# Patient Record
Sex: Male | Born: 1940 | Race: Black or African American | Hispanic: No | Marital: Married | Smoking: Former smoker
Health system: Southern US, Community
[De-identification: ages and names within clinical notes are randomized; demographics above are authoritative.]

## PROBLEM LIST (undated history)

## (undated) DIAGNOSIS — R32 Unspecified urinary incontinence: Secondary | ICD-10-CM

## (undated) DIAGNOSIS — I639 Cerebral infarction, unspecified: Secondary | ICD-10-CM

## (undated) DIAGNOSIS — E119 Type 2 diabetes mellitus without complications: Secondary | ICD-10-CM

## (undated) DIAGNOSIS — K219 Gastro-esophageal reflux disease without esophagitis: Secondary | ICD-10-CM

## (undated) DIAGNOSIS — R0789 Other chest pain: Secondary | ICD-10-CM

## (undated) DIAGNOSIS — R413 Other amnesia: Secondary | ICD-10-CM

## (undated) DIAGNOSIS — M1990 Unspecified osteoarthritis, unspecified site: Secondary | ICD-10-CM

## (undated) DIAGNOSIS — E1140 Type 2 diabetes mellitus with diabetic neuropathy, unspecified: Secondary | ICD-10-CM

## (undated) DIAGNOSIS — N400 Enlarged prostate without lower urinary tract symptoms: Secondary | ICD-10-CM

## (undated) DIAGNOSIS — G8929 Other chronic pain: Secondary | ICD-10-CM

## (undated) DIAGNOSIS — M549 Dorsalgia, unspecified: Secondary | ICD-10-CM

## (undated) DIAGNOSIS — I10 Essential (primary) hypertension: Secondary | ICD-10-CM

## (undated) DIAGNOSIS — R296 Repeated falls: Secondary | ICD-10-CM

## (undated) DIAGNOSIS — W19XXXA Unspecified fall, initial encounter: Secondary | ICD-10-CM

## (undated) DIAGNOSIS — G4733 Obstructive sleep apnea (adult) (pediatric): Secondary | ICD-10-CM

## (undated) DIAGNOSIS — R001 Bradycardia, unspecified: Secondary | ICD-10-CM

## (undated) HISTORY — PX: JOINT REPLACEMENT: SHX530

---

## 2005-10-10 ENCOUNTER — Emergency Department: Payer: Self-pay | Admitting: Emergency Medicine

## 2006-01-29 ENCOUNTER — Other Ambulatory Visit: Payer: Self-pay

## 2006-01-29 ENCOUNTER — Emergency Department: Payer: Self-pay | Admitting: Emergency Medicine

## 2010-10-16 DIAGNOSIS — H401190 Primary open-angle glaucoma, unspecified eye, stage unspecified: Secondary | ICD-10-CM | POA: Insufficient documentation

## 2010-10-16 DIAGNOSIS — E1139 Type 2 diabetes mellitus with other diabetic ophthalmic complication: Secondary | ICD-10-CM | POA: Insufficient documentation

## 2010-10-16 DIAGNOSIS — H18419 Arcus senilis, unspecified eye: Secondary | ICD-10-CM | POA: Insufficient documentation

## 2010-10-16 DIAGNOSIS — H409 Unspecified glaucoma: Secondary | ICD-10-CM | POA: Insufficient documentation

## 2010-10-16 DIAGNOSIS — E113299 Type 2 diabetes mellitus with mild nonproliferative diabetic retinopathy without macular edema, unspecified eye: Secondary | ICD-10-CM | POA: Insufficient documentation

## 2010-10-16 DIAGNOSIS — E119 Type 2 diabetes mellitus without complications: Secondary | ICD-10-CM | POA: Insufficient documentation

## 2010-10-16 DIAGNOSIS — H04129 Dry eye syndrome of unspecified lacrimal gland: Secondary | ICD-10-CM | POA: Insufficient documentation

## 2011-10-07 ENCOUNTER — Emergency Department: Payer: Self-pay | Admitting: Emergency Medicine

## 2012-01-21 DIAGNOSIS — E1136 Type 2 diabetes mellitus with diabetic cataract: Secondary | ICD-10-CM | POA: Insufficient documentation

## 2012-01-21 DIAGNOSIS — H16149 Punctate keratitis, unspecified eye: Secondary | ICD-10-CM | POA: Insufficient documentation

## 2012-10-06 DIAGNOSIS — M5136 Other intervertebral disc degeneration, lumbar region: Secondary | ICD-10-CM | POA: Insufficient documentation

## 2012-10-06 DIAGNOSIS — E119 Type 2 diabetes mellitus without complications: Secondary | ICD-10-CM | POA: Insufficient documentation

## 2012-10-06 DIAGNOSIS — M47816 Spondylosis without myelopathy or radiculopathy, lumbar region: Secondary | ICD-10-CM | POA: Insufficient documentation

## 2012-10-06 DIAGNOSIS — M1990 Unspecified osteoarthritis, unspecified site: Secondary | ICD-10-CM | POA: Insufficient documentation

## 2012-10-06 DIAGNOSIS — M533 Sacrococcygeal disorders, not elsewhere classified: Secondary | ICD-10-CM | POA: Insufficient documentation

## 2012-10-06 DIAGNOSIS — G4733 Obstructive sleep apnea (adult) (pediatric): Secondary | ICD-10-CM | POA: Insufficient documentation

## 2013-01-11 DIAGNOSIS — M47816 Spondylosis without myelopathy or radiculopathy, lumbar region: Secondary | ICD-10-CM | POA: Insufficient documentation

## 2013-05-06 ENCOUNTER — Observation Stay: Payer: Self-pay | Admitting: Student

## 2013-05-06 DIAGNOSIS — R1013 Epigastric pain: Secondary | ICD-10-CM

## 2013-05-06 DIAGNOSIS — I059 Rheumatic mitral valve disease, unspecified: Secondary | ICD-10-CM

## 2013-05-06 DIAGNOSIS — E785 Hyperlipidemia, unspecified: Secondary | ICD-10-CM

## 2013-05-06 DIAGNOSIS — R079 Chest pain, unspecified: Secondary | ICD-10-CM

## 2013-05-06 DIAGNOSIS — G4733 Obstructive sleep apnea (adult) (pediatric): Secondary | ICD-10-CM

## 2013-05-06 DIAGNOSIS — I10 Essential (primary) hypertension: Secondary | ICD-10-CM

## 2013-05-06 LAB — HEPATIC FUNCTION PANEL A (ARMC)
Alkaline Phosphatase: 102 U/L (ref 50–136)
Bilirubin,Total: 0.3 mg/dL (ref 0.2–1.0)
SGOT(AST): 30 U/L (ref 15–37)
SGPT (ALT): 32 U/L (ref 12–78)
Total Protein: 7.9 g/dL (ref 6.4–8.2)

## 2013-05-06 LAB — BASIC METABOLIC PANEL
Anion Gap: 3 — ABNORMAL LOW (ref 7–16)
BUN: 16 mg/dL (ref 7–18)
Chloride: 99 mmol/L (ref 98–107)
EGFR (Non-African Amer.): >60
Osmolality: 278 (ref 275–301)

## 2013-05-06 LAB — CK TOTAL AND CKMB (NOT AT ARMC)
CK, Total: 269 U/L — ABNORMAL HIGH (ref 35–232)
CK-MB: 6.8 ng/mL — ABNORMAL HIGH (ref 0.5–3.6)
CK-MB: 4.5 ng/mL — ABNORMAL HIGH (ref 0.5–3.6)

## 2013-05-06 LAB — CBC
HCT: 41.2 % (ref 40.0–52.0)
MCH: 30.8 pg (ref 26.0–34.0)
MCHC: 34.8 g/dL (ref 32.0–36.0)
MCV: 89 fL (ref 80–100)
RDW: 13 % (ref 11.5–14.5)
WBC: 9.8 10*3/uL (ref 3.8–10.6)

## 2013-05-06 LAB — LIPASE, BLOOD: Lipase: 151 U/L (ref 73–393)

## 2013-05-06 LAB — APTT
Activated PTT: 34.3 secs (ref 23.6–35.9)
Activated PTT: 103.1 secs — ABNORMAL HIGH (ref 23.6–35.9)

## 2013-05-19 DIAGNOSIS — I639 Cerebral infarction, unspecified: Secondary | ICD-10-CM | POA: Insufficient documentation

## 2013-05-19 DIAGNOSIS — K219 Gastro-esophageal reflux disease without esophagitis: Secondary | ICD-10-CM | POA: Insufficient documentation

## 2013-05-19 DIAGNOSIS — E039 Hypothyroidism, unspecified: Secondary | ICD-10-CM | POA: Insufficient documentation

## 2013-07-06 DIAGNOSIS — F1190 Opioid use, unspecified, uncomplicated: Secondary | ICD-10-CM | POA: Insufficient documentation

## 2013-11-30 DIAGNOSIS — K222 Esophageal obstruction: Secondary | ICD-10-CM | POA: Insufficient documentation

## 2013-11-30 DIAGNOSIS — IMO0001 Reserved for inherently not codable concepts without codable children: Secondary | ICD-10-CM | POA: Insufficient documentation

## 2014-01-17 DIAGNOSIS — M549 Dorsalgia, unspecified: Secondary | ICD-10-CM | POA: Insufficient documentation

## 2014-01-17 DIAGNOSIS — G894 Chronic pain syndrome: Secondary | ICD-10-CM | POA: Insufficient documentation

## 2014-04-05 DIAGNOSIS — F09 Unspecified mental disorder due to known physiological condition: Secondary | ICD-10-CM | POA: Insufficient documentation

## 2014-09-13 DIAGNOSIS — R001 Bradycardia, unspecified: Secondary | ICD-10-CM | POA: Insufficient documentation

## 2014-09-15 DIAGNOSIS — M5431 Sciatica, right side: Secondary | ICD-10-CM | POA: Insufficient documentation

## 2014-09-18 DIAGNOSIS — I10 Essential (primary) hypertension: Secondary | ICD-10-CM | POA: Insufficient documentation

## 2014-10-06 NOTE — Consult Note (Signed)
General Aspect David Sloan is a 74 yo male w/ PMHx s/f DM2, HTN, HLD, h/o CVA, OSA (on CPAP) and obesity who was admitted to [facility] this AM with epigastric/abdominal pain. Cardiology consulted for positive troponin.  He receives care at [facility]. He does not follow a cardiologist. He had a stress test ~ 2 years ago which was "normal" per patient. He reports experiencing intermittent epigastric discomfort over the past 2-3 weeks. The first episode lasted several minutes and resolved completely with antacids. Yesterday, he stepped out of the shower and experienced a similar epigastric tightness radiating along his rib cage bilaterally. He notices the discomfort after meals. No associated diaphoresis or dyspnea. He does note associated belching. No relation to exertion. He did have an episode of NBNB emesis. No frank chest pain. The pain did respond to antacids, but persisted. He denies PND, orthopnea, SOB/DOE, LE edema. No fevers, chills, diarrhea, constipation. He had a colonoscopy earlier this year which was normal. No bloody stools. He presented to the ED for further eval.   Present Illness In the ED, EKG revealed no ischemic changes. Initial TnI mildly elevated at 0.06, CK-MB 6.8. CMP- Na 135. BUN 16/Cr 1.16. CBC WNL. Lipase WNL. CXR indicated no acute cardiopulmonary process. BP elevated in the ED (180-198/80-87).   BP 192/81 this AM.   PAST MEDICAL HISTORY: Obstructive sleep apnea on CPAP, stroke 12 years ago with no residual deficits, chronic back issues, high cholesterol, diabetes, hypertension.   SURGERIES: Knee surgeries bilaterally.   ALLERGIES: Denies.   FAMILY HISTORY: Mom with diabetes.   SOCIAL HISTORY: No tobacco, alcohol or drug use.   Physical Exam:  GEN no acute distress, obese   HEENT pink conjunctivae, PERRL, hearing intact to voice   NECK supple  No masses  trachea midline  no JVD or bruits   RESP normal resp effort  clear BS  no use of accessory muscles   CARD Regular rate  and rhythm  Normal, S1, S2  No murmur   ABD soft  normal BS  epigastric tenderness to palpation   EXTR negative cyanosis/clubbing, negative edema   SKIN normal to palpation   NEURO follows commands, motor/sensory function intact   PSYCH alert, A+O to time, place, person   Review of Systems:  Subjective/Chief Complaint abdominal pain   General: No Complaints   Skin: No Complaints   ENT: No Complaints   Eyes: No Complaints   Neck: No Complaints   Respiratory: No Complaints   Cardiovascular: Chest pain or discomfort   Gastrointestinal: Nausea   Genitourinary: No Complaints   Vascular: No Complaints   Musculoskeletal: No Complaints   Neurologic: No Complaints   Hematologic: No Complaints   Endocrine: No Complaints   Psychiatric: No Complaints   Review of Systems: All other systems were reviewed and found to be negative   Medications/Allergies Reviewed Medications/Allergies reviewed   Home Medications: Medication Instructions Status  Micardis 80 mg oral tablet 1  orally once a day  Active  felodipine 5 mg oral tablet, extended release 1  orally once a day  Active  lovastatin 40 mg oral tablet 1 ea orally once a day (at bedtime)  Active  atenolol 50 mg oral tablet 1 ea orally once a day  Active  asa 81 mg daily  Active   Lab Results:  Routine Chem:  21-Nov-14 00:28   Lipase 151 (Result(s) reported on 06 May 2013 at 01:21AM.)  Cardiac:  21-Nov-14 00:28   CK, Total  355  CPK-MB, Serum  6.8 (Result(s) reported on 06 May 2013 at 01:27AM.)  Troponin I  0.06 (0.00-0.05 0.05 ng/mL or less: NEGATIVE  Repeat testing in 3-6 hrs  if clinically indicated. >0.05 ng/mL: POTENTIAL  MYOCARDIAL INJURY. Repeat  testing in 3-6 hrs if  clinically indicated. NOTE: An increase or decrease  of 30% or more on serial  testing suggests a  clinically important change)    04:28   CK, Total  269  CPK-MB, Serum  4.5 (Result(s) reported on 06 May 2013 at 05:17AM.)   Troponin I  0.06 (0.00-0.05 0.05 ng/mL or less: NEGATIVE  Repeat testing in 3-6 hrs  if clinically indicated. >0.05 ng/mL: POTENTIAL  MYOCARDIAL INJURY. Repeat  testing in 3-6 hrs if  clinically indicated. NOTE: An increase or decrease  of 30% or more on serial  testing suggests a  clinically important change)   EKG:  Interpretation EKG shows sinus bradycardia, no ST/T changes   Rate 56   Radiology Results: XRay:    21-Nov-14 00:30, Chest Portable Single View  Chest Portable Single View   REASON FOR EXAM:    CP  COMMENTS:       PROCEDURE: DXR - DXR PORTABLE CHEST SINGLE VIEW  - May 06 2013 12:30AM     CLINICAL DATA:  Chest pain    EXAM:  PORTABLE CHEST - 1 VIEW    COMPARISON:  None.    FINDINGS:  The cardiac and mediastinal silhouettes are within normal limits.  The patient has taken a slightly shallow breath. No airspace  consolidation, pleural effusion, or pulmonary edema is identified.  There is no pneumothorax.    No acute osseous abnormality identified.     IMPRESSION:  No radiographic evidence of acute cardiopulmonary process.      Electronically Signed    By: Rise Mu M.D.    On: 05/06/2013 00:39       Verified By: Fonda Kinder, M.D.,  Cardiology:    21-Nov-14 11:11, Echo Doppler  Echo Doppler   REASON FOR EXAM:      COMMENTS:       PROCEDURE: [facility] - ECHO DOPPLER COMPLETE(TRANSTHOR)  - May 06 2013 11:11AM     RESULT: Echocardiogram Report    Patient Name:   David Sloan Date of Exam: 05/06/2013  Medical Rec #:  [id number]          Custom1:  Date of Birth:  Nov 23, 1940        Height:       66.1 in  Patient Age:    72 years        Weight:       196.2 lb  Patient Gender: M               BSA:          1.99 m??    Indications: MI  Sonographer:    Cristela Blue RDCS  Referring Phys: Krystal Eaton    Sonographer Comments: Technically challenging study due to less than   ideal echo windows and The only view obtainable was  apical.    Summary:   1. Essentially a normal study.   2. Left ventricular ejection fraction, by visual estimation, is 60 to   65%.   3. Normal global left ventricular systolic function.   4. Impaired relaxation pattern of LV diastolic filling.   5. Mild left ventricular hypertrophy.   6. Normal right ventricular size and systolic function.   7. Mild mitral valve  regurgitation.   8. Normal RVSP  2D AND M-MODE MEASUREMENTS (normal ranges within parentheses):  Left Ventricle:          Normal  IVSd (2D):      1.25 cm (0.7-1.1)  LVPWd (2D):     1.14 cm (0.7-1.1) Aorta/LA:                  Normal  LVIDd (2D):     3.86 cm (3.4-5.7) Aortic Root (2D): 2.10 cm (2.4-3.7)  LVIDs (2D):     2.63 cm           Left Atrium (2D): 4.40 cm (1.9-4.0)  LV FS (2D):     31.9 %   (>25%)  LV EF (2D):     60.6 %   (>50%)                                    Right Ventricle:               RVd (2D):  LV DIASTOLIC FUNCTION:  MV Peak E: 0.84 m/s E/e' Ratio: 9.00  MV Peak A: 1.12 m/s Decel Time: 352 msec  E/A Ratio: 0.75  SPECTRAL DOPPLER ANALYSIS (where applicable):  Mitral Valve:  MV P1/2 Time: 102.08 msec  MV Area, PHT: 2.16 cm??  Aortic Valve: AoV Max Vel: 1.27 m/s AoV Peak PG: 6.5 mmHg AoV Mean PG:  LVOT Vmax: 1.06 m/s LVOT VTI:  LVOT Diameter: 2.10 cm  AoV Area, Vmax: 2.89 cm?? AoV Area, VTI:  AoV Area, Vmn:  Tricuspid Valve and PA/RV Systolic Pressure: TR Max Velocity: 1.74 m/s RA   Pressure: 5 mmHg RVSP/PASP: 17.1 mmHg    PHYSICIAN INTERPRETATION:  Left Ventricle: The left ventricular internal cavity size was normal. LV   posterior wall thickness was normal. Mild left ventricular hypertrophy.   Global LV systolic function was normal. Left ventricular ejection   fraction, by visual estimation, is 60 to 65%. Spectral Doppler shows   impaired relaxation pattern of LV diastolic filling.  Right Ventricle: Normal right ventricular size, wall thickness, and   systolic function. The right ventricular size  is normal. Global RV   systolic function is normal.  Left Atrium: The left atrium is normal in size.  Right Atrium: The right atrium is normal in size.  Pericardium: There is no evidence of pericardial effusion.  Mitral Valve: The mitral valve is normal in structure. Mild mitral valve   regurgitation is seen.  Tricuspid Valve: The tricuspid valve is normal. Trivial tricuspid   regurgitation is visualized. The tricuspid regurgitant velocity is 1.74   m/s, and with an assumed right atrial pressure of 5 mmHg, the estimated   right ventricular systolic pressure is normal at 17.1 mmHg.  Aortic Valve: The aortic valve is normal.  Aorta: The aortic root is normal in size and structure.  [id number] Julien Nordmann MD  Electronically signed by [id number] Julien Nordmann MD  Signature Date/Time: 05/06/2013/11:39:15 AM    *** Final ***    IMPRESSION: .        Verified By: Antonieta Iba, M.D., MD    No Known Allergies:   Vital Signs/Nurse's Notes: **Vital Signs.:   21-Nov-14 07:36  Vital Signs Type Routine  Temperature Temperature (F) 97.8  Celsius 36.5  Pulse Pulse 68  Pulse source if not from Vital Sign Device per Telemetry Clerk  Systolic BP Systolic BP 152  Diastolic BP (mmHg)  Diastolic BP (mmHg) 65  Mean BP 94  Pulse Ox % Pulse Ox % 99  Oxygen Delivery 2L    Impression Note was co-written with Hurman Hornony Ascencion Stegner (and Dossie Arbourim Gollan)  74 yo male w/ PMHx s/f DM2, HTN, HLD, h/o CVA, OSA (on CPAP) and obesity who [facility] with epigastric/abdominal pain. Cardiology consulted for positive troponin.  1. Epigastric pain  past 2-3 weeks occurring after meals and relieving with antacids. No relation to exertion. associated belching.   EKG indicates no evidence of ischemia. TnI mildly elevated at 0.06 x 3.  He has no prior cardiac history.   stress test ~ 2 years ago was normal per patient. Cardiac RFs include DM2, HTN, HLD, obesity. Symptoms  sound atypical and more GI related.  LFTs and lipase WNL. Abdominal ultrasound pending.  He is diabetic and may present atypically. Troponin leak may be secondary to demand ischemia from markedly elevated BP in the ED.  -- Echo is essentially normal -- Lexiscan Myoview ordered for inpatient -- Continue ASA, statin, NTG SL PRN -- d/c heparin -- Switch atenolol for carvedilol given HTN, added alpha activity. HR 50s yesterday and today. Could start at 6.25mg  BID, monitor rates -- GI cocktail, PPI, H2 blockers for GI etiologies. Further work-up per primary team.   2. Hypertension Markedly elevated this AM and in the ED.  -- Carvedilol for atenolol as above -- Increase CCB -- Consider adding hydralazine  3. Hyperlipidemia -- Check lipid panel -- Continue statin  4. IDDM -- Per primary team.  5. OSA on CPAP -- Review 2D echo   Electronic Signatures for Addendum Section:  Julien NordmannGollan, Timothy (MD) (Signed Addendum 21-Nov-14 11:58)  Patient seena nd examined. Agree with above. symptoms concerning more for GI etiology. sterss test planned to definatively exclude cardiac etiology as patient continues to present to the ER. If stress negative, would recommend further GI workup, possible EGD.   Electronic Signatures: Gery Prayrguello, Jaydah Stahle A (PA-C)  (Signed 21-Nov-14 10:29)  Authored: General Aspect/Present Illness, History and Physical Exam, Review of System, Home Medications, Labs, EKG , Radiology, Allergies, Vital Signs/Nurse's Notes, Impression/Plan Julien NordmannGollan, Timothy (MD)  (Signed 21-Nov-14 11:56)  Authored: Review of System, EKG , Radiology, Vital Signs/Nurse's Notes, Impression/Plan  Co-Signer: General Aspect/Present Illness, History and Physical Exam, Review of System, Home Medications, Labs, EKG , Radiology, Allergies, Vital Signs/Nurse's Notes, Impression/Plan   Last Updated: 21-Nov-14 11:58 by Julien NordmannGollan, Timothy (MD)

## 2014-10-06 NOTE — H&P (Signed)
PATIENT NAME:  David Sloan, David Sloan MR#:  [id number] DATE OF BIRTH:  Dec 22, 1940  DATE OF ADMISSION:  05/06/2013  PRIMARY CARE PHYSICIAN: At [facility].   REFERRING PHYSICIAN: Dr. Lenard Lance.   CHIEF COMPLAINT: Epigastric pain, chest pressure.   HISTORY OF PRESENT ILLNESS: The patient is a pleasant 74 year old male with a history of diabetes, obstructive sleep apnea on CPAP, stroke in the past who comes in with acute onset epigastric pain and pressure that started at 9:00. There is no radiation to this. When I asked to point at it, he points in the upper abdomen, lower chest, mid sternal area. There is no shortness of breath or dizziness, but the patient did have an episode of nausea and vomiting. The patient had a similar less intense episode a couple of weeks ago that resolved before he could come to the ER. Here, he has a mildly elevated troponin and CK-MB. He has ongoing epigastric pain and pressure. Heparin drip has been started, and hospitalist service was contacted for further evaluation and management.   Again, this pain is nonradiating. There is no shortness of breath or dizziness with this pain. He has had no history of MI in the past but has multiple risk factors.   PAST MEDICAL HISTORY: Obstructive sleep apnea on CPAP, stroke 12 years ago with no residual deficits, chronic back issues, high cholesterol, diabetes, hypertension.   SURGERIES: Knee surgeries bilaterally.   ALLERGIES: Denies.   FAMILY HISTORY: Mom with diabetes.   SOCIAL HISTORY: No tobacco, alcohol or drug use.   OUTPATIENT MEDICATIONS: He is on 70/30 insulin 30 units b.i.d., aspirin 81 mg daily, atenolol 50 mg daily, felodipine XL 5 mg daily, lovastatin 40 mg, Micardis 80 mg once a day.   REVIEW OF SYSTEMS:  CONSTITUTIONAL: The patient has intentional weight loss. No fever, chills, fatigue, weakness.  EYES: No blurry vision or double vision.  ENT: No tinnitus or hearing loss. No postnasal drip or sore throat.  RESPIRATORY:  No cough, wheezing, shortness of breath or dyspnea on exertion.  CARDIOVASCULAR: As above. No swelling in the legs. No arrhythmia. Has hypertension. No history of CHF or MI.   GASTROINTESTINAL: Did have an episode of nausea and vomiting. No abdominal pain. Some epigastric the pain and pressure. No GERD or metallic taste in his mouth.  GENITOURINARY: Denies dysuria or hematuria.  HEMATOLOGIC AND LYMPHATIC: Denies anemia or easy bruising.  SKIN: No rashes.  MUSCULOSKELETAL: Has chronic back pain and arthritis.  NEUROLOGIC: No focal weakness or numbness.  PSYCHIATRIC: No anxiety or insomnia.   PHYSICAL EXAMINATION:  VITAL SIGNS: Temperature on arrival 98.3, pulse rate 57, respiratory rate 18, blood pressure 186/87, last blood pressure 182/66. O2 sat 100% on room air.  GENERAL: The patient is an obese male lying in bed, no obvious distress.  HEENT: Normocephalic, atraumatic. Pupils are equal and reactive. Anicteric sclerae. Extraocular muscles intact. Poor dentition.  NECK: Supple. No thyroid tenderness. No cervical lymphadenopathy. No JVD.  CARDIOVASCULAR: S1, S2. Bradycardic. No murmurs, rubs or gallops.  LUNGS: Clear to auscultation without wheezing, rhonchi or rales.  ABDOMEN: Soft, nontender and nondistended. Positive bowel sounds in all quadrants.  EXTREMITIES: No significant lower extremity edema.  SKIN: No obvious rashes.  NEUROLOGIC: Cranial nerves II through XII grossly intact. Strength is 5 out of 5 in all extremities. Sensation is intact to light touch.  PSYCHIATRIC: Awake, alert, oriented x 3 and cooperative.   LABORATORIES: Glucose 216, BUN 16, creatinine 1.14, sodium 135, potassium 3.9. Lipase 151. LFTs within  normal limits. Troponin 0.06, CK-MB 6.8. CK total 355. WBC 9.8, hemoglobin 14.3, platelets are 202. EKG: No previous EKGs to compare with, but this one shows sinus brady. Nonspecific ST changes. No acute ST elevations or depressions. Some flattening of T waves in some of the  inferior leads and V6. Chest x-ray, 1 view, showing no radiological evidence of acute cardiopulmonary process.   ASSESSMENT AND PLAN: A 74 year old with multiple risk factors for coronary artery disease including hypertension, diabetes, hyperlipidemia, history of stroke who comes in with epigastric/lower chest pain and pressure with a positive troponin. As the pain and pressure is ongoing, will admit the patient to the hospital for suspected non-ST elevation myocardial infarction. Would go ahead and continue the heparin drip and make the patient n.p.o. except medications. Obtain a cardiology consult in case they want to take him to the catheterization lab. Cycle the troponins. Obtain an echocardiogram. Would continue the aspirin, statin. Continue his beta blocker. Check a lipid profile, hemoglobin A1c and a TSH. Will admit him to telemetry. Will put him on morphine as well as nitro patch for symptomatic relief. As there is some epigastric component, will also go ahead and order some Maalox as needed. I would hold his 70/30 insulin. Put him on sliding scale insulin for now given his n.p.o. status. Will continue blood pressure management and also had hydralazine intravenous p.r.n. as his pressure is also elevated. He appears to have accelerated hypertension. We would continue outpatient medications for now. For deep vein thrombosis prophylaxis, he will be on heparin drip.   CODE STATUS: The patient is FULL CODE.   TOTAL TIME SPENT: 50 minutes.    ____________________________ Krystal EatonShayiq Torii Royse, MD sa:gb D: 05/06/2013 03:10:08 ET T: 05/06/2013 04:08:31 ET JOB#: [id number]  cc: Krystal EatonShayiq Zidane Renner, MD, <Dictator> Krystal EatonSHAYIQ Sharnese Heath MD ELECTRONICALLY SIGNED 05/23/2013 20:00

## 2014-10-06 NOTE — Discharge Summary (Signed)
PATIENT NAME:  David CombsWYATT, Ulas J MR#:  [id number] DATE OF BIRTH:  [birth year]  DATE OF ADMISSION:  05/06/2013 DATE OF DISCHARGE:  05/06/2013  PRESENTING COMPLAINT: Epigastric/chest pain.   DISCHARGE DIAGNOSES: 1.  Chest pain, appears epigastric. Pain likely due to gastroesophageal reflux disease or chronic cholelithiasis. Myoview stress test within normal limits.  2.  Hypertension.  3.  Type 2 diabetes.  4.  Hyperlipidemia.   CODE STATUS: FULL CODE.   DISCHARGE MEDICATIONS: 1.  Aspirin 81 mg daily.  2.  Atenolol 50 mg daily.  3.  Lovastatin 40 mg daily at bedtime.  4.  Amlodipine 5 mg extended release p.o. daily.  5.  Micardis 80 mg daily.  6.  Protonix 40 mg p.o. daily.   DISCHARGE INSTRUCTIONS:  1.  [facility]  2.  Low sodium, carbohydrate-controlled ADA diet.  3.  The patient to follow-up with Dr. Egbert GaribaldiBird on 05/29/2013 at 1:45 p.m. for chronic gallstones.   LABORATORY AND DIAGNOSTICS: Myoview stress test within normal limits. EF of 65%.   Ultrasound of the abdomen showed gallstones and sludge within the gallbladder, otherwise unremarkable.   Echo Doppler within normal limits with EF of 60% to 65%,  except mild impaired relaxation of LV diastolic function.   Troponin was 0.06, 0.06, and 0.06. CBC within normal limits. Lipase is 151. LFTs within normal limits.  CONSULTANTS:  Cardiology with Dr. Mariah MillingGollan.  BRIEF SUMMARY OF HOSPITAL COURSE: [name] is a 74 year old Caucasian gentleman with history of hypertension and type 2 diabetes who comes in with epigastric pain. He was admitted with:  1.  Epigastric pain. The patient was admitted on the telemetry floor where he remained in sinus rhythm. No acute ischemic changes on EKG were noted. His symptoms seemed to be more from acid reflux. He was seen by cardiology and he was subjected to chemical stress test which essentially was within normal limits. He was started on p.o. PPI. He felt better and his symptoms were at  home improving with on and off using TUMS and Rolaids. Ultrasound of the abdomen was done, which showed gallstones, which are likely chronic. His LFTs are within normal limits. The patient was advised to see surgery as outpatient and if his symptoms do not improve getting the gallbladder removed. He has an appointment with Dr. Egbert GaribaldiBird in December.  2.  Hypertension. Micardis, atenolol, and felodipine were continued.  3.  Type 2 diabetes. Diet-controlled.  4.  Hyperlipidemia. On statins.   Hospital stay otherwise remained stable. The patient remained a FULL CODE.   TIME SPENT: 40 minutes.  ____________________________ Wylie HailSona A. Allena KatzPatel, MD sap:sb D: 05/09/2013 07:11:34 ET T: 05/09/2013 07:54:03 ET JOB#: [id number]  cc: Kalaya Infantino A. Allena KatzPatel, MD, <Dictator> Dr. Mellody DanceKizer [facility] Mark A. Egbert GaribaldiBird, MD  Willow OraSONA A Refujio Haymer MD ELECTRONICALLY SIGNED 05/09/2013 14:18

## 2015-02-13 DIAGNOSIS — Z79899 Other long term (current) drug therapy: Secondary | ICD-10-CM | POA: Insufficient documentation

## 2015-03-30 ENCOUNTER — Emergency Department: Payer: Medicare Other

## 2015-03-30 ENCOUNTER — Inpatient Hospital Stay: Payer: Medicare Other

## 2015-03-30 ENCOUNTER — Inpatient Hospital Stay (HOSPITAL_COMMUNITY)
Admit: 2015-03-30 | Discharge: 2015-03-30 | Disposition: A | Discharge status: Home / Self Care | Payer: Medicare Other | Attending: Specialist | Admitting: Specialist

## 2015-03-30 ENCOUNTER — Inpatient Hospital Stay
Admission: EM | Admit: 2015-03-30 | Discharge: 2015-03-31 | DRG: 069 | Disposition: A | Discharge status: Home / Self Care | Payer: Medicare Other | Attending: Internal Medicine | Admitting: Internal Medicine

## 2015-03-30 DIAGNOSIS — E1140 Type 2 diabetes mellitus with diabetic neuropathy, unspecified: Secondary | ICD-10-CM | POA: Diagnosis present

## 2015-03-30 DIAGNOSIS — R7989 Other specified abnormal findings of blood chemistry: Secondary | ICD-10-CM

## 2015-03-30 DIAGNOSIS — Z820 Family history of epilepsy and other diseases of the nervous system: Secondary | ICD-10-CM | POA: Diagnosis not present

## 2015-03-30 DIAGNOSIS — R7401 Elevation of levels of liver transaminase levels: Secondary | ICD-10-CM

## 2015-03-30 DIAGNOSIS — I6310 Cerebral infarction due to embolism of unspecified precerebral artery: Secondary | ICD-10-CM | POA: Diagnosis not present

## 2015-03-30 DIAGNOSIS — I639 Cerebral infarction, unspecified: Secondary | ICD-10-CM

## 2015-03-30 DIAGNOSIS — I119 Hypertensive heart disease without heart failure: Secondary | ICD-10-CM | POA: Diagnosis present

## 2015-03-30 DIAGNOSIS — R778 Other specified abnormalities of plasma proteins: Secondary | ICD-10-CM

## 2015-03-30 DIAGNOSIS — E785 Hyperlipidemia, unspecified: Secondary | ICD-10-CM | POA: Diagnosis present

## 2015-03-30 DIAGNOSIS — N400 Enlarged prostate without lower urinary tract symptoms: Secondary | ICD-10-CM | POA: Diagnosis present

## 2015-03-30 DIAGNOSIS — K8020 Calculus of gallbladder without cholecystitis without obstruction: Secondary | ICD-10-CM

## 2015-03-30 DIAGNOSIS — R29898 Other symptoms and signs involving the musculoskeletal system: Secondary | ICD-10-CM | POA: Diagnosis present

## 2015-03-30 DIAGNOSIS — Z87891 Personal history of nicotine dependence: Secondary | ICD-10-CM | POA: Diagnosis not present

## 2015-03-30 DIAGNOSIS — G459 Transient cerebral ischemic attack, unspecified: Secondary | ICD-10-CM | POA: Diagnosis present

## 2015-03-30 DIAGNOSIS — I10 Essential (primary) hypertension: Secondary | ICD-10-CM

## 2015-03-30 DIAGNOSIS — I214 Non-ST elevation (NSTEMI) myocardial infarction: Secondary | ICD-10-CM

## 2015-03-30 DIAGNOSIS — E1165 Type 2 diabetes mellitus with hyperglycemia: Secondary | ICD-10-CM | POA: Diagnosis present

## 2015-03-30 DIAGNOSIS — E1159 Type 2 diabetes mellitus with other circulatory complications: Secondary | ICD-10-CM

## 2015-03-30 DIAGNOSIS — I8290 Acute embolism and thrombosis of unspecified vein: Secondary | ICD-10-CM

## 2015-03-30 DIAGNOSIS — IMO0002 Reserved for concepts with insufficient information to code with codable children: Secondary | ICD-10-CM

## 2015-03-30 DIAGNOSIS — Z794 Long term (current) use of insulin: Secondary | ICD-10-CM | POA: Diagnosis not present

## 2015-03-30 DIAGNOSIS — I951 Orthostatic hypotension: Secondary | ICD-10-CM | POA: Diagnosis present

## 2015-03-30 DIAGNOSIS — R4781 Slurred speech: Secondary | ICD-10-CM | POA: Diagnosis not present

## 2015-03-30 DIAGNOSIS — Z8546 Personal history of malignant neoplasm of prostate: Secondary | ICD-10-CM

## 2015-03-30 DIAGNOSIS — K219 Gastro-esophageal reflux disease without esophagitis: Secondary | ICD-10-CM | POA: Diagnosis present

## 2015-03-30 DIAGNOSIS — R112 Nausea with vomiting, unspecified: Secondary | ICD-10-CM

## 2015-03-30 DIAGNOSIS — G4733 Obstructive sleep apnea (adult) (pediatric): Secondary | ICD-10-CM | POA: Diagnosis present

## 2015-03-30 DIAGNOSIS — Z7982 Long term (current) use of aspirin: Secondary | ICD-10-CM

## 2015-03-30 DIAGNOSIS — Z8673 Personal history of transient ischemic attack (TIA), and cerebral infarction without residual deficits: Secondary | ICD-10-CM

## 2015-03-30 DIAGNOSIS — Z833 Family history of diabetes mellitus: Secondary | ICD-10-CM

## 2015-03-30 DIAGNOSIS — R1110 Vomiting, unspecified: Secondary | ICD-10-CM

## 2015-03-30 DIAGNOSIS — K829 Disease of gallbladder, unspecified: Secondary | ICD-10-CM | POA: Diagnosis not present

## 2015-03-30 DIAGNOSIS — R740 Nonspecific elevation of levels of transaminase and lactic acid dehydrogenase [LDH]: Secondary | ICD-10-CM

## 2015-03-30 DIAGNOSIS — I2510 Atherosclerotic heart disease of native coronary artery without angina pectoris: Secondary | ICD-10-CM | POA: Diagnosis present

## 2015-03-30 DIAGNOSIS — E782 Mixed hyperlipidemia: Secondary | ICD-10-CM

## 2015-03-30 DIAGNOSIS — R9431 Abnormal electrocardiogram [ECG] [EKG]: Secondary | ICD-10-CM

## 2015-03-30 HISTORY — DX: Dorsalgia, unspecified: M54.9

## 2015-03-30 HISTORY — DX: Benign prostatic hyperplasia without lower urinary tract symptoms: N40.0

## 2015-03-30 HISTORY — DX: Other amnesia: R41.3

## 2015-03-30 HISTORY — DX: Unspecified osteoarthritis, unspecified site: M19.90

## 2015-03-30 HISTORY — DX: Unspecified urinary incontinence: R32

## 2015-03-30 HISTORY — DX: Repeated falls: R29.6

## 2015-03-30 HISTORY — DX: Cerebral infarction, unspecified: I63.9

## 2015-03-30 HISTORY — DX: Unspecified fall, initial encounter: W19.XXXA

## 2015-03-30 HISTORY — DX: Obstructive sleep apnea (adult) (pediatric): G47.33

## 2015-03-30 HISTORY — DX: Other chronic pain: G89.29

## 2015-03-30 HISTORY — DX: Bradycardia, unspecified: R00.1

## 2015-03-30 HISTORY — DX: Type 2 diabetes mellitus with diabetic neuropathy, unspecified: E11.40

## 2015-03-30 HISTORY — DX: Essential (primary) hypertension: I10

## 2015-03-30 HISTORY — DX: Other chest pain: R07.89

## 2015-03-30 HISTORY — DX: Type 2 diabetes mellitus without complications: E11.9

## 2015-03-30 LAB — PROTIME-INR
INR: 1.32
PROTHROMBIN TIME: 16.6 s — AB (ref 11.4–15.0)

## 2015-03-30 LAB — TROPONIN I
Troponin I: 0.21 ng/mL — ABNORMAL HIGH (ref <0.031)
Troponin I: 0.41 ng/mL — ABNORMAL HIGH (ref <0.031)
Troponin I: 0.54 ng/mL — ABNORMAL HIGH (ref <0.031)
Troponin I: 0.54 ng/mL — ABNORMAL HIGH (ref <0.031)

## 2015-03-30 LAB — CBC
HCT: 36.7 % — ABNORMAL LOW (ref 40.0–52.0)
Hemoglobin: 12.5 g/dL — ABNORMAL LOW (ref 13.0–18.0)
MCH: 30.9 pg (ref 26.0–34.0)
MCHC: 34.1 g/dL (ref 32.0–36.0)
MCV: 90.4 fL (ref 80.0–100.0)
PLATELETS: 161 10*3/uL (ref 150–440)
RBC: 4.06 MIL/uL — AB (ref 4.40–5.90)
RDW: 13 % (ref 11.5–14.5)
WBC: 16.6 10*3/uL — AB (ref 3.8–10.6)

## 2015-03-30 LAB — DIFFERENTIAL
BASOS PCT: 0 %
Basophils Absolute: 0.1 10*3/uL (ref 0–0.1)
EOS PCT: 0 %
Eosinophils Absolute: 0 10*3/uL (ref 0–0.7)
LYMPHS PCT: 1 %
Lymphs Abs: 0.2 10*3/uL — ABNORMAL LOW (ref 1.0–3.6)
MONO ABS: 1 10*3/uL (ref 0.2–1.0)
Monocytes Relative: 6 %
NEUTROS PCT: 93 %
Neutro Abs: 15.3 10*3/uL — ABNORMAL HIGH (ref 1.4–6.5)

## 2015-03-30 LAB — GLUCOSE, CAPILLARY
GLUCOSE-CAPILLARY: 162 mg/dL — AB (ref 65–99)
Glucose-Capillary: 218 mg/dL — ABNORMAL HIGH (ref 65–99)
Glucose-Capillary: 190 mg/dL — ABNORMAL HIGH (ref 65–99)

## 2015-03-30 LAB — COMPREHENSIVE METABOLIC PANEL
ALBUMIN: 3.5 g/dL (ref 3.5–5.0)
ALK PHOS: 73 U/L (ref 38–126)
ALT: 232 U/L — ABNORMAL HIGH (ref 17–63)
ANION GAP: 8 (ref 5–15)
AST: 301 U/L — ABNORMAL HIGH (ref 15–41)
BUN: 16 mg/dL (ref 6–20)
CALCIUM: 9.1 mg/dL (ref 8.9–10.3)
CHLORIDE: 106 mmol/L (ref 101–111)
CO2: 26 mmol/L (ref 22–32)
Creatinine, Ser: 1.23 mg/dL (ref 0.61–1.24)
GFR calc non Af Amer: 57 mL/min — ABNORMAL LOW (ref >60)
GLUCOSE: 152 mg/dL — AB (ref 65–99)
POTASSIUM: 3.6 mmol/L (ref 3.5–5.1)
SODIUM: 140 mmol/L (ref 135–145)
Total Bilirubin: 2.5 mg/dL — ABNORMAL HIGH (ref 0.3–1.2)
Total Protein: 6.3 g/dL — ABNORMAL LOW (ref 6.5–8.1)

## 2015-03-30 LAB — APTT: aPTT: 32 seconds (ref 24–36)

## 2015-03-30 MED ORDER — GABAPENTIN 300 MG PO CAPS: 300.0000 mg | ORAL_CAPSULE | Freq: Two times a day (BID) | ORAL | Status: DC

## 2015-03-30 MED ORDER — ASPIRIN 81 MG PO CHEW
324.0000 mg | CHEWABLE_TABLET | Freq: Once | ORAL | Status: AC
  Administered 2015-03-30: 324 mg via ORAL
  Filled 2015-03-30: qty 4

## 2015-03-30 MED ORDER — IOHEXOL 350 MG/ML SOLN
100.0000 mL | Freq: Once | INTRAVENOUS | Status: AC | PRN
  Administered 2015-03-30: 18:00:00 100 mL via INTRAVENOUS

## 2015-03-30 MED ORDER — AMLODIPINE BESYLATE 5 MG PO TABS
5.0000 mg | ORAL_TABLET | Freq: Every day | ORAL | Status: DC
  Administered 2015-03-30 – 2015-03-31 (×2): 5 mg via ORAL
  Filled 2015-03-30 (×2): qty 1

## 2015-03-30 MED ORDER — ENOXAPARIN SODIUM 40 MG/0.4ML ~~LOC~~ SOLN
40.0000 mg | SUBCUTANEOUS | Status: DC
  Administered 2015-03-30 – 2015-03-31 (×2): 40 mg via SUBCUTANEOUS
  Filled 2015-03-30 (×2): qty 0.4

## 2015-03-30 MED ORDER — ASPIRIN-DIPYRIDAMOLE ER 25-200 MG PO CP12
1.0000 | ORAL_CAPSULE | Freq: Two times a day (BID) | ORAL | Status: DC
  Administered 2015-03-30 – 2015-03-31 (×3): 1 via ORAL
  Filled 2015-03-30 (×3): qty 1

## 2015-03-30 MED ORDER — GABAPENTIN 300 MG PO CAPS
600.0000 mg | ORAL_CAPSULE | Freq: Every evening | ORAL | Status: DC
  Administered 2015-03-30: 18:00:00 600 mg via ORAL
  Filled 2015-03-30: qty 2

## 2015-03-30 MED ORDER — SODIUM CHLORIDE 0.9 % IJ SOLN
3.0000 mL | Freq: Two times a day (BID) | INTRAMUSCULAR | Status: DC
  Administered 2015-03-30 – 2015-03-31 (×3): 3 mL via INTRAVENOUS

## 2015-03-30 MED ORDER — OXYCODONE-ACETAMINOPHEN 7.5-325 MG PO TABS
1.0000 | ORAL_TABLET | Freq: Four times a day (QID) | ORAL | Status: DC | PRN
  Administered 2015-03-30 – 2015-03-31 (×3): 1 via ORAL
  Filled 2015-03-30 (×3): qty 1

## 2015-03-30 MED ORDER — TAMSULOSIN HCL 0.4 MG PO CAPS
0.4000 mg | ORAL_CAPSULE | Freq: Every day | ORAL | Status: DC
  Administered 2015-03-30 – 2015-03-31 (×2): 0.4 mg via ORAL
  Filled 2015-03-30 (×2): qty 1

## 2015-03-30 MED ORDER — INSULIN DETEMIR 100 UNIT/ML ~~LOC~~ SOLN
35.0000 [IU] | Freq: Every day | SUBCUTANEOUS | Status: DC
  Administered 2015-03-31: 09:00:00 35 [IU] via SUBCUTANEOUS
  Filled 2015-03-30 (×2): qty 0.35

## 2015-03-30 MED ORDER — ONDANSETRON HCL 4 MG/2ML IJ SOLN: 4.0000 mg | Freq: Four times a day (QID) | INTRAMUSCULAR | Status: DC | PRN

## 2015-03-30 MED ORDER — ONDANSETRON HCL 4 MG PO TABS: 4.0000 mg | ORAL_TABLET | Freq: Four times a day (QID) | ORAL | Status: DC | PRN

## 2015-03-30 MED ORDER — LOSARTAN POTASSIUM 50 MG PO TABS
100.0000 mg | ORAL_TABLET | Freq: Every day | ORAL | Status: DC
  Administered 2015-03-31: 100 mg via ORAL
  Filled 2015-03-30: qty 2

## 2015-03-30 MED ORDER — PRAVASTATIN SODIUM 20 MG PO TABS
40.0000 mg | ORAL_TABLET | Freq: Every day | ORAL | Status: DC
  Administered 2015-03-30: 40 mg via ORAL
  Filled 2015-03-30: qty 2

## 2015-03-30 MED ORDER — CLONIDINE HCL 0.1 MG PO TABS
0.2000 mg | ORAL_TABLET | Freq: Every day | ORAL | Status: DC
  Administered 2015-03-31: 0.2 mg via ORAL
  Filled 2015-03-30: qty 2

## 2015-03-30 MED ORDER — ACETAMINOPHEN 650 MG RE SUPP: 650.0000 mg | Freq: Four times a day (QID) | RECTAL | Status: DC | PRN

## 2015-03-30 MED ORDER — INSULIN NPH (HUMAN) (ISOPHANE) 100 UNIT/ML ~~LOC~~ SUSP: 30.0000 [IU] | Freq: Two times a day (BID) | SUBCUTANEOUS | Status: DC

## 2015-03-30 MED ORDER — GABAPENTIN 300 MG PO CAPS
300.0000 mg | ORAL_CAPSULE | Freq: Every morning | ORAL | Status: DC
  Administered 2015-03-30 – 2015-03-31 (×2): 300 mg via ORAL
  Filled 2015-03-30 (×2): qty 1

## 2015-03-30 MED ORDER — INSULIN DETEMIR 100 UNIT/ML ~~LOC~~ SOLN
30.0000 [IU] | Freq: Every day | SUBCUTANEOUS | Status: DC
  Administered 2015-03-30: 18:00:00 30 [IU] via SUBCUTANEOUS
  Filled 2015-03-30 (×2): qty 0.3

## 2015-03-30 MED ORDER — ACETAMINOPHEN 325 MG PO TABS: 650.0000 mg | ORAL_TABLET | Freq: Four times a day (QID) | ORAL | Status: DC | PRN

## 2015-03-30 MED ORDER — OXYBUTYNIN CHLORIDE ER 5 MG PO TB24
5.0000 mg | ORAL_TABLET | Freq: Every day | ORAL | Status: DC
  Administered 2015-03-30 – 2015-03-31 (×2): 5 mg via ORAL
  Filled 2015-03-30 (×2): qty 1

## 2015-03-30 MED ORDER — PANTOPRAZOLE SODIUM 40 MG PO TBEC
40.0000 mg | DELAYED_RELEASE_TABLET | Freq: Two times a day (BID) | ORAL | Status: DC
  Administered 2015-03-30 – 2015-03-31 (×3): 40 mg via ORAL
  Filled 2015-03-30 (×3): qty 1

## 2015-03-30 NOTE — ED Notes (Signed)
Patient remains in US.

## 2015-03-30 NOTE — Progress Notes (Signed)
   03/30/15 0800  Clinical Encounter Type  Visited With Patient and family together  Visit Type Code  Referral From Nurse  Consult/Referral To Chaplain  Spiritual Encounters  Spiritual Needs Prayer  Responded to ED Code Stroke page.  Pt was alert and responsive.  Family present, nurse tech Helmut MusterAlicia, pt's daughter was present.  Pt thanked me for coming and said he was feeling a little better, but stated he was concerned about weakness in legs.  I asked pt if chaplain could visit/check in with him and his family later and he said he would appreciate that.  Will have on call or unit chaplain follow up with both patient and daughter later today.  [employer]bury Automotive GroupChaplain Marletta Bousquet-pager [phone]

## 2015-03-30 NOTE — Progress Notes (Signed)
*  PRELIMINARY RESULTS* Echocardiogram 2D Echocardiogram has been performed.  Georgann HousekeeperJerry R Hege 03/30/2015, 2:05 PM

## 2015-03-30 NOTE — Progress Notes (Signed)
Elevated troponin, notified MD. Cardiology consult called.

## 2015-03-30 NOTE — ED Provider Notes (Addendum)
[facility] Emergency Department Provider Note  Time seen: 7:44 AM  I have reviewed the triage vital signs and the nursing notes.   HISTORY  Chief Complaint Code Stroke    HPI David Sloan is a 74 y.o. male with a past medical history of hypertension, diabetes, CVA on Coumadin, presents the emergency department with weakness in his legs. According to the patient he awoke at 12:45 AM shaking and feeling nauseated. Patient states he had one episode of vomiting and then felt considerably better so he went back to sleep. Upon awakening this morning he noted that his legs felt weak, and he felt off balance when walking. Patient believes his left leg feels more weak in his right leg. Denies any numbness. Denies any visual changes. The patient doesn't history of 2 CVAs in the past although he states he has no residual symptoms. Patient's daughter is here in the emergency department, and states he appears to have a left facial droop to her, and his speech sounds more slurred.    Past Medical History  Diagnosis Date  . Hypertension   . Diabetes mellitus without complication (HCC)   . Stroke [facility])     There are no active problems to display for this patient.   No past surgical history on file.  No current outpatient prescriptions on file.  Allergies Review of patient's allergies indicates no known allergies.  No family history on file.  Social History Social History  Substance Use Topics  . Smoking status: Never Smoker   . Smokeless tobacco: None  . Alcohol Use: No    Review of Systems Constitutional: Negative for fever. Cardiovascular: Negative for chest pain. Respiratory: Negative for shortness of breath. Gastrointestinal: Negative for abdominal pain Musculoskeletal: Negative for back pain. Neurological: Negative for . Increased weakness on the left side. No numbness. 10-point ROS otherwise  negative.  ____________________________________________   PHYSICAL EXAM:  VITAL SIGNS: ED Triage Vitals  Enc Vitals Group     BP 03/30/15 0717 113/53 mmHg     Pulse Rate 03/30/15 0717 98     Resp 03/30/15 0717 18     Temp 03/30/15 0717 98.5 F (36.9 C)     Temp Source 03/30/15 0717 Oral     SpO2 03/30/15 0717 96 %     Weight 03/30/15 0717 203 lb (92.08 kg)     Height 03/30/15 0717  (1.676 m)     Head Cir --      Peak Flow --      Pain Score --      Pain Loc --      Pain Edu? --      Excl. in GC? --     Constitutional: Alert and oriented. Well appearing and in no distress. Eyes: Normal exam ENT   Head: Normocephalic and atraumatic.   Mouth/Throat: Mucous membranes are moist. Cardiovascular: Normal rate, regular rhythm. No murmur Respiratory: Normal respiratory effort without tachypnea nor retractions. Breath sounds are clear and equal bilaterally. No wheezes/rales/rhonchi. Gastrointestinal: Soft and nontender. No distention.   Musculoskeletal: Nontender with normal range of motion in all extremities.  Neurologic:  Slightly slurred speech. No difficulty with language. Equal grip strengths bilaterally, 5/5 motor in all extremities. Patient does have a slight left facial droop, as well as a slight left upper extremity pronator drift. Psychiatric: Mood and affect are normal. Speech and behavior are normal. ____________________________________________    EKG  EKG reviewed and interpreted by myself shows sinus rhythm at  88 bpm, narrow QRS, left axis deviation, otherwise normal intervals, nonspecific ST changes present. No ST elevation noted.  ____________________________________________    RADIOLOGY  CT head shows atrophy with prior infarcts. Potential acute infarcts in the occipital lobe.  ____________________________________________    INITIAL IMPRESSION / ASSESSMENT AND PLAN / ED COURSE  Pertinent labs & imaging results that were available during my  care of the patient were reviewed by me and considered in my medical decision making (see chart for details).  Patient presents with signs and symptoms of a CVA causing left-sided deficits of facial droop and left pronator drift. Patient feels off balance and unsteady this morning. CT shows possible acute infarct in the occipital lobe. Patient is on Coumadin, and as symptoms began at 1245 he is not a candidate for TPA therapy given anticoagulation and time window. Currently awaiting lab results, plan to admit to the hospital for acute CVA.  Patient's troponin is elevated 0.21. Patient's EKG and further review appears to have new inferolateral T-wave inversions compared to prior EKG of 05/05/13. Patient denies any chest pain or trouble breathing. Patient will be admitted to the hospital with serial troponins. Patient's INR is currently subtherapeutic at 1.3.  Patient's liver function tests have resulted elevated as well. Patient denies any abdominal pain. Given the patient's inferolateral T-wave inversions, elevated troponin, and elevated liver function tests, this raises the possibility of a prior MI leading to congestive heart failure and hepatic congestion. We'll obtain an ultrasound to help further evaluate the patient's liver.   CRITICAL CARE Performed by: Minna AntisPADUCHOWSKI, Markasia Carrol   Total critical care time: 30 minutes  Critical care time was exclusive of separately billable procedures and treating other patients.  Critical care was necessary to treat or prevent imminent or life-threatening deterioration.  Critical care was time spent personally by me on the following activities: development of treatment plan with patient and/or surrogate as well as nursing, discussions with consultants, evaluation of patient's response to treatment, examination of patient, obtaining history from patient or surrogate, ordering and performing treatments and interventions, ordering and review of laboratory studies,  ordering and review of radiographic studies, pulse oximetry and re-evaluation of patient's condition.    NIH Stroke Scale    Time: 7:49 AM Person Administering Scale: Jermon Chalfant  Administer stroke scale items in the order listed. Record performance in each category after each subscale exam. Do not go back and change scores. Follow directions provided for each exam technique. Scores should reflect what the patient does, not what the clinician thinks the patient can do. The clinician should record answers while administering the exam and work quickly. Except where indicated, the patient should not be coached (i.e., repeated requests to patient to make a special effort).   1a  Level of consciousness: 0=alert; keenly responsive  1b. LOC questions:  0=Performs both tasks correctly  1c. LOC commands: 0=Performs both tasks correctly  2.  Best Gaze: 0=normal  3.  Visual: 0=No visual loss  4. Facial Palsy: 1=Minor paralysis (flattened nasolabial fold, asymmetric on smiling)  5a.  Motor left arm: 1=Drift, limb holds 90 (or 45) degrees but drifts down before full 10 seconds: does not hit bed  5b.  Motor right arm: 0=No drift, limb holds 90 (or 45) degrees for full 10 seconds  6a. motor left leg: 0=No drift, limb holds 90 (or 45) degrees for full 10 seconds  6b  Motor right leg:  0=No drift, limb holds 90 (or 45) degrees for full 10 seconds  7. Limb Ataxia: 0=Absent  8.  Sensory: 0=Normal; no sensory loss  9. Best Language:  0=No aphasia, normal  10. Dysarthria: 1=Mild to moderate, patient slurs at least some words and at worst, can be understood with some difficulty  11. Extinction and Inattention: 0=No abnormality  12. Distal motor function: 0=Normal   Total:   3    ____________________________________________   FINAL CLINICAL IMPRESSION(S) / ED DIAGNOSES  Acute CVA NSTEMI NSTEMI  [name], MD 03/30/15 [id number]  [name], MD 03/30/15 [id number]

## 2015-03-30 NOTE — Progress Notes (Addendum)
Notified MD of FSBS. Per MD, check ac/hs. Levemir ordered for supper. No other intervention at this time.

## 2015-03-30 NOTE — Consult Note (Signed)
CARDIOLOGY CONSULT NOTE   Patient ID: David Sloan MRN: [id number], DOB/AGE: Jan 15, 1941   Admit date: 03/30/2015 Date of Consult: 03/30/2015  Primary Physician: No primary care provider on file. Primary Cardiologist: Concha Se, MD   Pt. Profile  74 y/o male with a prior h/o CVA who was admitted on 10/14 with weakness and slurred speech, who has been found to have an elevated troponin and abnl ECG.  Problem List  Past Medical History  Diagnosis Date  . Essential hypertension   . Diabetes mellitus without complication (HCC)   . Stroke [facility])     a. ~ 2002 - no residual deficits;  b. 03/2015 Head CT: prior old infarcts and foci of small vesel dzs in pons bilat. ? bilat occipital lobe acute infarcts R>L.  . Diabetic neuropathy (HCC)   . BPH (benign prostatic hyperplasia)   . Urinary incontinence   . Atypical chest pain     a. 04/2013 MV: nl EF, no ischemia.  . Osteoarthritis     a. s/p bilat knee surgeries.  . OSA (obstructive sleep apnea)   . Chronic back pain   . Sinus bradycardia   . Falls   . Memory disorder     History reviewed. No pertinent past surgical history.   Allergies  No Known Allergies  HPI   74 y/o male with a prior h/o CVA, DM, HTN, and OSA.  He also has a h/o atypical c/p with negative myoview in 04/2013.  He has done well from a cardiac standpoint since that time.  He says that though he took the summer off, he has been exercising about 30 mins /day over the past week without any c/p or dyspnea.  This AM, he awoke around 1a b/c he noted chills and shaking.  He then needed to use the bathroom/void but felt like he couldn't move either of his legs.  His wife helped him to sit up and he became nauseated.  She helped him to the bathroom and he vomited.  He remained very weak over the next six hour and by 7a, they had contacted a friend to bring him into the ED.  Per ED notes, his family felt that his face appeared drooped while his speech was more slurred  than usual, though he denies this.  Head CT showed prior old infarcts and foci of small vessel dzs in pons bilat with ? Of acute infarcts in pons and bilat occipital lobes.  MRI pending.  Troponin was noted to be elevated - 0.21 0.41.  ECG showed new inferior and anterolateral TWI with a new left axis.  Echo has been performed and shows nl LV fxn.  LFT's markedly elevated.   Abd u/s shows acute cholecystitis. Pt denies experiencing any c/p or dyspnea today.  He is currently symptom free.  He has had complete resolution of bilat leg wkns.   Inpatient Medications  . amLODipine  5 mg Oral Daily  . cloNIDine  0.2 mg Oral Daily  . dipyridamole-aspirin  1 capsule Oral BID  . enoxaparin (LOVENOX) injection  40 mg Subcutaneous Q24H  . gabapentin  300 mg Oral q morning - 10a  . gabapentin  600 mg Oral QPM  . insulin detemir  30 Units Subcutaneous Q supper  . [START ON 03/31/2015] insulin detemir  35 Units Subcutaneous Q breakfast  . losartan  100 mg Oral Daily  . oxybutynin  5 mg Oral Daily  . pantoprazole  40 mg Oral BID  . pravastatin  40 mg Oral q1800  . sodium chloride  3 mL Intravenous Q12H  . tamsulosin  0.4 mg Oral Daily    Family History Family History  Problem Relation Age of Onset  . Alzheimer's disease Mother   . Diabetes Father      Social History Social History   Social History  . Marital Status: Married    Spouse Name: N/A  . Number of Children: N/A  . Years of Education: N/A   Occupational History  . Not on file.   Social History Main Topics  . Smoking status: Former Smoker -- 1.00 packs/day for 25 years    Types: Cigarettes  . Smokeless tobacco: Not on file  . Alcohol Use: No  . Drug Use: No  . Sexual Activity: Not on file   Other Topics Concern  . Not on file   Social History Narrative     Review of Systems  General:  No chills, fever, night sweats or weight changes.  Cardiovascular:  No chest pain, dyspnea on exertion, edema, orthopnea, palpitations,  paroxysmal nocturnal dyspnea. Dermatological: No rash, lesions/masses Respiratory: No cough, dyspnea Urologic: No hematuria, dysuria Abdominal:   +++ nausea, vomiting x1 this AM.  No diarrhea, bright red blood per rectum, melena, or hematemesis Neurologic:  No visual changes, +++ wkns, no changes in mental status. All other systems reviewed and are otherwise negative except as noted above.  Physical Exam  Blood pressure 132/63, pulse 59, temperature 97.8 F (36.6 C), temperature source Oral, resp. rate 18, height  (1.676 m), weight 203 lb (92.08 kg), SpO2 97 %.  General: Pleasant, NAD Psych: Normal affect. Neuro: Alert and oriented X 3. Moves all extremities spontaneously. HEENT: Normal  Neck: Supple without bruits or JVD. Lungs:  Resp regular and unlabored, CTA. Heart: RRR no s3, s4, or murmurs. Abdomen: Soft, non-tender, non-distended, BS + x 4.  Extremities: No clubbing, cyanosis or edema. DP/PT/Radials 2+ and equal bilaterally.  Labs   Recent Labs  03/30/15 0744 03/30/15 1233  TROPONINI 0.21* 0.41*   Lab Results  Component Value Date   WBC 16.6* 03/30/2015   HGB 12.5* 03/30/2015   HCT 36.7* 03/30/2015   MCV 90.4 03/30/2015   PLT 161 03/30/2015     Recent Labs Lab 03/30/15 0744  NA 140  K 3.6  CL 106  CO2 26  BUN 16  CREATININE 1.23  CALCIUM 9.1  PROT 6.3*  BILITOT 2.5*  ALKPHOS 73  ALT 232*  AST 301*  GLUCOSE 152*   Radiology/Studies  Ct Head Wo Contrast  03/30/2015  CLINICAL DATA:  Left-sided weakness and gait disorder EXAM: CT HEAD WITHOUT CONTRAST TECHNIQUE: Contiguous axial images were obtained from the base of the skull through the vertex without intravenous contrast. COMPARISON:  None. FINDINGS: There is mild diffuse atrophy. There is no intracranial mass, hemorrhage, extra-axial fluid collection, or midline shift. There is evidence of a prior infarct in the inferomedial left occipital lobe. There are old lacunar type infarcts in the centra  semiovale bilaterally. There is patchy small vessel disease throughout the centra semiovale currently. Currently, there is decreased attenuation in the mid portions of each occipital lobe which are concerning for potential acute infarcts in these areas. There is also decreased attenuation in the upper to mid pons bilaterally, slightly more on the right than on the left, consistent with basilar perforator distribution disease of uncertain age. There is evidence of a prior small lacunar infarct in the lateral right thalamus. The bony calvarium appears  intact. The mastoid air cells are clear. Note that there is a small metallic foreign body overlying the superior right temporal bone. There is mild susceptibility artifact on the right from this metallic foreign body. There are small retention cysts in the left maxillary antrum. There is also mild mucosal thickening in several ethmoid air cells bilaterally. Frontal sinuses are essentially aplastic. IMPRESSION: Atrophy with prior old infarcts and foci of small vessel disease. Age uncertain small vessel disease in the pons bilaterally. A small acute infarct in this area cannot be excluded. There is decreased attenuation in portions of each occipital lobe which potentially could represent acute infarcts. These changes are slightly more prominent on the right than on the left. Metallic foreign body in the soft tissues overlying the superior right temporal bone. Critical Value/emergent results were called by telephone at the time of interpretation on 03/30/2015 at 7:41 am to Dr. Minna AntisKEVIN PADUCHOWSKI , who verbally acknowledged these results. No hemorrhage or mass effect.  No midline shift. Electronically Signed   By: Bretta BangWilliam  Woodruff III M.D.   On: 03/30/2015 07:41   [place] Carotid Bilateral  03/30/2015  CLINICAL DATA:  CVA. EXAM: BILATERAL CAROTID DUPLEX ULTRASOUND TECHNIQUE: Wallace CullensGray scale imaging, color Doppler and duplex ultrasound were performed of bilateral carotid and  vertebral arteries in the neck. COMPARISON:  None. FINDINGS: Criteria: Quantification of carotid stenosis is based on velocity parameters that correlate the residual internal carotid diameter with NASCET-based stenosis levels, using the diameter of the distal internal carotid lumen as the denominator for stenosis measurement. The following velocity measurements were obtained: RIGHT ICA:  96 cm/sec CCA:  80 cm/sec SYSTOLIC ICA/CCA RATIO:  1.2 DIASTOLIC ICA/CCA RATIO:  0.9 ECA:  181 cm/sec LEFT ICA:  129 cm/sec CCA:  129 cm/sec SYSTOLIC ICA/CCA RATIO:  1.0 DIASTOLIC ICA/CCA RATIO:  1.1 ECA:  126 cm/sec RIGHT CAROTID ARTERY: Minimal echogenic plaque or intimal thickening at the right carotid bulb without stenosis. Echogenic plaque at the origin of the external carotid artery. Small amount of echogenic plaque in the proximal internal carotid artery. Normal waveforms and velocities in the right internal carotid artery. RIGHT VERTEBRAL ARTERY: Antegrade flow and normal waveform in the right vertebral artery. LEFT CAROTID ARTERY: There is a mobile heterogeneous flap or thrombus in the proximal left common carotid artery. Small amount of echogenic plaque at the left carotid bulb. Small amount of echogenic plaque in the left internal carotid artery with normal waveforms. Minimal elevation of the left internal carotid artery peak systolic velocity, measuring 129 cm/sec. Left carotid artery ratios are within normal limits. LEFT VERTEBRAL ARTERY: Antegrade flow and normal waveform in the left vertebral artery. 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 No significant stenosis associated with this lesion but there is risk for thromboembolic disease and difficult to exclude a focal dissection in this region. Consider further characterization with a CTA of the neck. Mild echogenic plaque in the carotid arteries bilaterally. Estimated degree of stenosis in the internal carotid arteries is less  than 50% bilaterally. Patent vertebral arteries. These results were called by telephone at the time of interpretation on 03/30/2015 at 4:51 pm to Dr. Hilda LiasVIVEK SAINANI , who verbally acknowledged these results. Electronically Signed   By: Richarda OverlieAdam  Henn M.D.   On: 03/30/2015 16:53   [place] Abdomen Limited Ruq  03/30/2015  CLINICAL DATA:  Elevated liver enzymes EXAM: US ABDOMEN LIMITED - RIGHT UPPER QUADRANT COMPARISON:  None. FINDINGS: Gallbladder: Gallbladder contains sludge with small scattered intermingled gallstones.  Largest gallstone measures 4 mm. The gallbladder wall is mildly thickened and edematous. No sonographic Murphy sign noted. Common bile duct: Diameter: 3 mm. There is no intrahepatic or extrahepatic biliary duct dilatation. Liver: No focal lesion identified.  Liver echogenicity is increased. IMPRESSION: Sludge and gallstones within gallbladder. Gallbladder wall thickened and edematous. Suspect a degree of acute cholecystitis. Increased liver echogenicity is most likely due to hepatic steatosis. While no focal liver lesions are identified, it must be cautioned that the sensitivity of ultrasound for focal liver lesions is diminished in this circumstance. Electronically Signed   By: Bretta Bang III M.D.   On: 03/30/2015 09:56    ECG  RSR, 88, LAD, LAFB, new TWI in II, III, aVF, V4-V6 - new c/w 04/2013 ECG.  Of note - care everywhere has an ECG report from [facility] in 10/2014, which apparently showed nonspecific T wave abnormality in the setting of sinus bradycardia.  Scanned image not available.  ASSESSMENT AND PLAN  1.  Acute CVA vs TIA:  Pt presented to the ED on the morning of 10/14 with bilat LE wkns and shaking/chills.  Head CT suggestive of possible small acute infarct in pons along with possible bilat occipital infarcts.  Ss have resolved.  MRI pending.  2.  Elevated Troponin/Abnl ECG:  In setting of #1, pt noted to have mild, relatively flat troponin elevation thus far.  He denies any recent  h/o chest pain or dyspnea.  He has been exercising over the past wk w/o limitations - last walked on treadmill and used stationary bike on 10/12.  Prior neg MV in 04/2013.  Echo read and preliminarily appears to show nl LV fxn.  Once CVA issue elucidated, we can consider inpatient stress testing, especially if it becomes apparent that he will need surgical intervention for GB dzs.  Of note, notes from [facility] indicate that he has a h/o sinus bradycardia prev worsened by bb - avoid.  3.  Essential HTN:  Stable.  4.  DM II:  Per IM.  5.  Transaminitis/cholelithiasis/acute cholecystitis/Hepatic steatosis:  He developed chills, nausea, and vomiting this AM. Currently Ss free.  IM following.  Surgical eval.  Signed, Nicolasa Ducking, NP 03/30/2015, 5:19 PM

## 2015-03-30 NOTE — H&P (Signed)
[facility] -  at [facility]   PATIENT NAME: David Sloan    MR#:  [id number]  DATE OF BIRTH:  07-Feb-1941  DATE OF ADMISSION:  03/30/2015  PRIMARY CARE PHYSICIAN: No primary care provider on file.   REQUESTING/REFERRING PHYSICIAN: Dr. [name]  CHIEF COMPLAINT:   Chief Complaint  Patient presents with  . Code Stroke   slurred speech, rigors/chills, bilateral lower extremity weakness.  HISTORY OF PRESENT ILLNESS:  David Sloan  is a 74 y.o. male with a known history of hypertension, type 2 diabetes without complication, history of previous CVA, diabetic neuropathy, BPH, urinary incontinence who presents to the hospital due to slurred speech, rigors/chills, and bilateral lower extremity weakness that began earlier this morning. She was in his usual state of health when he woke up this morning and started having some shakes and chills. His wife also noticed that he had some slurred speech and his legs started to give way and he felt significantly weak. He came to the ER for further evaluation. Patient has a history of previous CVA and therefore is acute symptoms are consistent with a possible stroke and he underwent a CT of his head which showed old strokes and a possible subacute CVA. His slurred speech has since then resolved. He denies any further chills, or any worsening lower extremity weakness. He denies any numbness, tingling.  Patient denies any chest pain, but admits to some nausea and vomiting overnight 1. She presented to the emergency room was noted to have a mildly elevated troponin with abnormal EKG and also noted to have elevated LFTs and a CT scan head which showed possible subacute CVA. Hospitalist services were contacted further treatment and evaluation.  PAST MEDICAL HISTORY:   Past Medical History  Diagnosis Date  . Hypertension   . Diabetes mellitus without complication (HCC)   . Stroke (HCC)   . Diabetic neuropathy (HCC)   .  BPH (benign prostatic hyperplasia)   . Urinary incontinence     PAST SURGICAL HISTORY:  History reviewed. No pertinent past surgical history.  SOCIAL HISTORY:   Social History  Substance Use Topics  . Smoking status: Former Smoker -- 1.00 packs/day for 25 years    Types: Cigarettes  . Smokeless tobacco: Not on file  . Alcohol Use: No    FAMILY HISTORY:   Family History  Problem Relation Age of Onset  . Alzheimer's disease Mother   . Diabetes Father     DRUG ALLERGIES:  No Known Allergies  REVIEW OF SYSTEMS:   Review of Systems  Constitutional: Positive for chills. Negative for fever and weight loss.  HENT: Negative for congestion, nosebleeds and tinnitus.   Eyes: Negative for blurred vision, double vision and redness.  Respiratory: Negative for cough, hemoptysis and shortness of breath.   Cardiovascular: Negative for chest pain, orthopnea, leg swelling and PND.  Gastrointestinal: Negative for nausea, vomiting, abdominal pain, diarrhea and melena.  Genitourinary: Negative for dysuria, urgency and hematuria.  Musculoskeletal: Negative for joint pain and falls.  Neurological: Positive for speech change, focal weakness (bilateral lower extremity) and weakness (generalized). Negative for dizziness, tingling, sensory change, seizures, loss of consciousness and headaches.  Endo/Heme/Allergies: Negative for polydipsia. Does not bruise/bleed easily.  Psychiatric/Behavioral: Negative for depression and memory loss. The patient is not nervous/anxious.     MEDICATIONS AT HOME:   Prior to Admission medications   Medication Sig Start Date End Date Taking? Authorizing Provider  amLODipine (NORVASC) 5 MG tablet Take 5 mg  by mouth daily. 12/20/14 12/20/15 Yes Historical Provider, MD  cloNIDine (CATAPRES) 0.1 MG tablet Take 0.2 mg by mouth daily. 09/18/14 09/18/15 Yes Historical Provider, MD  dipyridamole-aspirin (AGGRENOX) 200-25 MG 12hr capsule Take 1 capsule by mouth 2 (two) times daily.  01/15/15 01/15/16 Yes Historical Provider, MD  gabapentin (NEURONTIN) 300 MG capsule Take 1-2 capsules by mouth 2 (two) times daily. Take 1 capsule in the morning and 2 capsule in the evening. 11/14/14  Yes Historical Provider, MD  insulin NPH Human (HUMULIN N) 100 UNIT/ML injection Inject 30-35 Units into the skin 2 (two) times daily. 35 units before breakfast and 30 units before supper. 01/15/15  Yes Historical Provider, MD  losartan (COZAAR) 100 MG tablet Take 100 mg by mouth daily. 12/20/14 12/20/15 Yes Historical Provider, MD  lovastatin (MEVACOR) 40 MG tablet Take 40 mg by mouth daily. 01/15/15 01/15/16 Yes Historical Provider, MD  oxybutynin (DITROPAN XL) 5 MG 24 hr tablet Take 5 mg by mouth daily. 07/27/14 07/27/15 Yes Historical Provider, MD  oxyCODONE-acetaminophen (PERCOCET) 7.5-325 MG tablet Take 1 tablet by mouth every 6 (six) hours as needed. 02/13/15  Yes Historical Provider, MD  pantoprazole (PROTONIX) 40 MG tablet Take 40 mg by mouth 2 (two) times daily. 01/15/15 01/15/16 Yes Historical Provider, MD  tamsulosin (FLOMAX) 0.4 MG CAPS capsule Take 0.4 mg by mouth daily. 01/15/15 01/15/16 Yes Historical Provider, MD      VITAL SIGNS:  Blood pressure 135/61, pulse 68, temperature 98.7 F (37.1 C), temperature source Oral, resp. rate 14, height  (1.676 m), weight 92.08 kg (203 lb), SpO2 96 %.  PHYSICAL EXAMINATION:  Physical Exam  GENERAL:  74 y.o.-year-old patient lying in the bed with no acute distress.  EYES: Pupils equal, round, reactive to light and accommodation. No scleral icterus. Extraocular muscles intact.  HEENT: Head atraumatic, normocephalic. Oropharynx and nasopharynx clear. No oropharyngeal erythema, moist oral mucosa  NECK:  Supple, no jugular venous distention. No thyroid enlargement, no tenderness.  LUNGS: Normal breath sounds bilaterally, no wheezing, rales, rhonchi. No use of accessory muscles of respiration.  CARDIOVASCULAR: S1, S2 RRR. No murmurs, rubs, gallops, clicks.  ABDOMEN:  Soft, nontender, nondistended. Bowel sounds present. No organomegaly or mass.  EXTREMITIES: No pedal edema, cyanosis, or clubbing. + 2 pedal & radial pulses b/l.   NEUROLOGIC: Cranial nerves II through XII are intact. No focal Motor or sensory deficits appreciated b/l.   PSYCHIATRIC: The patient is alert and oriented x 3. Good affect.  SKIN: No obvious rash, lesion, or ulcer.   LABORATORY PANEL:   CBC  Recent Labs Lab 03/30/15 0744  WBC 16.6*  HGB 12.5*  HCT 36.7*  PLT 161   ------------------------------------------------------------------------------------------------------------------  Chemistries   Recent Labs Lab 03/30/15 0744  NA 140  K 3.6  CL 106  CO2 26  GLUCOSE 152*  BUN 16  CREATININE 1.23  CALCIUM 9.1  AST 301*  ALT 232*  ALKPHOS 73  BILITOT 2.5*   ------------------------------------------------------------------------------------------------------------------  Cardiac Enzymes  Recent Labs Lab 03/30/15 0744  TROPONINI 0.21*   ------------------------------------------------------------------------------------------------------------------  RADIOLOGY:  Ct Head Wo Contrast  03/30/2015  CLINICAL DATA:  Left-sided weakness and gait disorder EXAM: CT HEAD WITHOUT CONTRAST TECHNIQUE: Contiguous axial images were obtained from the base of the skull through the vertex without intravenous contrast. COMPARISON:  None. FINDINGS: There is mild diffuse atrophy. There is no intracranial mass, hemorrhage, extra-axial fluid collection, or midline shift. There is evidence of a prior infarct in the inferomedial left occipital lobe. There are  old lacunar type infarcts in the centra semiovale bilaterally. There is patchy small vessel disease throughout the centra semiovale currently. Currently, there is decreased attenuation in the mid portions of each occipital lobe which are concerning for potential acute infarcts in these areas. There is also decreased attenuation in  the upper to mid pons bilaterally, slightly more on the right than on the left, consistent with basilar perforator distribution disease of uncertain age. There is evidence of a prior small lacunar infarct in the lateral right thalamus. The bony calvarium appears intact. The mastoid air cells are clear. Note that there is a small metallic foreign body overlying the superior right temporal bone. There is mild susceptibility artifact on the right from this metallic foreign body. There are small retention cysts in the left maxillary antrum. There is also mild mucosal thickening in several ethmoid air cells bilaterally. Frontal sinuses are essentially aplastic. IMPRESSION: Atrophy with prior old infarcts and foci of small vessel disease. Age uncertain small vessel disease in the pons bilaterally. A small acute infarct in this area cannot be excluded. There is decreased attenuation in portions of each occipital lobe which potentially could represent acute infarcts. These changes are slightly more prominent on the right than on the left. Metallic foreign body in the soft tissues overlying the superior right temporal bone. Critical Value/emergent results were called by telephone at the time of interpretation on 03/30/2015 at 7:41 am to Dr. Minna AntisKEVIN PADUCHOWSKI , who verbally acknowledged these results. No hemorrhage or mass effect.  No midline shift. Electronically Signed   By: Bretta BangWilliam  Woodruff III M.D.   On: 03/30/2015 07:41     IMPRESSION AND PLAN:   74 year old male with past medical history of diabetes type 2 without complication, diabetic neuropathy, hypertension, BPH, history of previous CVA, urinary incontinence who presents to the hospital due to slurred speech, bilateral lower extremity weakness and also chills.  #1 CVA-this is a suspected diagnosis given patient's acute onset neurologic symptoms given slurred speech and lower extremity weakness. His CT scan shows old CVA but the possible subacute CVA. -I  will continue his Aggrenox for now. Continue statin. -I will get an MRI of his brain without contrast. Two-dimensional echo, carotid duplex. -I will also get a neurology consult. Follow every 4 hour neuro checks.  #2 elevated troponin with abnormal EKG-patient denies any chest pain, or any acute symptoms consistent with angina. He does have T-wave inversions in lateral leads which are new from his previous EKG in 2014. -I will cycle his cardiac markers, observe him on telemetry. Check a two-dimensional echo. -Get a cardiology consult.  #3 abnormal LFTs-etiology unclear. Patient's LFTs were abnormal during his hospitalization in 2014. He had an abdominal ultrasound which showed gallstones with gallbladder wall sludge. -I will repeat an ultrasound. Follow LFTs. If needed consider gastroenterology/surgical evaluation.  #4 hypertension-continue losartan, clonidine, Norvasc.  #5 type 2 diabetes without complication-continue NPH insulin.  #6 diabetic neuropathy-continue Neurontin.  #7 urinary incontinence-continue oxybutynin.  #7 BPH-continue Flomax.  #8 hyperlipidemia-continue Pravachol.  #9 GERD-continue Protonix.   All the records are reviewed and case discussed with ED provider. Management plans discussed with the patient, family and they are in agreement.  CODE STATUS: Full  TOTAL TIME TAKING CARE OF THIS PATIENT: 50 minutes.    Houston SirenSAINANI,Camielle Sizer J M.D on 03/30/2015 at 9:46 AM  Between 7am to 6pm - Pager - [phone]  After 6pm go to [email or web address] - password [email or web address] [facility]  [facility]  Office  [phone]  CC:  Primary care physician; No primary care provider on file.

## 2015-03-30 NOTE — Plan of Care (Signed)
Problem: Discharge/Transitional Outcomes Goal: Other Discharge Outcomes/Goals Outcome: Progressing Plan of care progress to goal: Pt admitted for CVA.  CT done, U/S done, ECHO done. C/o pain in head, percocet given. Relief noted. Visitors at the bedside. Troponins elevated, MD aware.

## 2015-03-30 NOTE — ED Notes (Signed)
Pt not receiving tpa due to being outside window and contraindication of being on coumadin.

## 2015-03-30 NOTE — ED Notes (Signed)
Spoke to Dr. Lenard LancePaduchowski, patient outside of the window for code stroke.

## 2015-03-30 NOTE — ED Notes (Signed)
Pt c/o waking from sleep sweating, weakness, off balance, slightly weaker on the left, unsteady gait.Marland Kitchen.denies numbness/tingling..denies pain

## 2015-03-30 NOTE — ED Notes (Signed)
Pt from home; last known well at midnight when he went to bed. States he felt cold just before going to bed and then awakened at 0045 with vomiting and weakness. Pt alert & oriented with warm, dry skin. Pt states he cannot read the words on stroke packet due to blurriness. When given the words to repeat, pt had no problem repeating the words. No slurring of speech noted.

## 2015-03-30 NOTE — ED Notes (Signed)
Pt given water to drink after passing stroke swallow screen

## 2015-03-30 NOTE — ED Notes (Signed)
Pt returned from ultrasound

## 2015-03-30 NOTE — Progress Notes (Signed)
   03/30/15 0930  Clinical Encounter Type  Visited With Patient  Visit Type Initial  Referral From Chaplain  Consult/Referral To Chaplain  Stress Factors  Patient Stress Factors None identified  Chaplain followed up with patient after fellow chaplain referred patient to me. Patient was having lunch and enjoying television. He was good in spirits and stated he [facility].   Chaplain Mckinnon Glick [phone]

## 2015-03-31 ENCOUNTER — Other Ambulatory Visit: Payer: Medicare Other

## 2015-03-31 DIAGNOSIS — R7989 Other specified abnormal findings of blood chemistry: Secondary | ICD-10-CM

## 2015-03-31 DIAGNOSIS — K8020 Calculus of gallbladder without cholecystitis without obstruction: Secondary | ICD-10-CM

## 2015-03-31 DIAGNOSIS — R740 Nonspecific elevation of levels of transaminase and lactic acid dehydrogenase [LDH]: Secondary | ICD-10-CM

## 2015-03-31 DIAGNOSIS — R7401 Elevation of levels of liver transaminase levels: Secondary | ICD-10-CM

## 2015-03-31 DIAGNOSIS — K829 Disease of gallbladder, unspecified: Secondary | ICD-10-CM

## 2015-03-31 DIAGNOSIS — R4781 Slurred speech: Secondary | ICD-10-CM

## 2015-03-31 LAB — COMPREHENSIVE METABOLIC PANEL
ALBUMIN: 3.1 g/dL — AB (ref 3.5–5.0)
ALT: 194 U/L — AB (ref 17–63)
AST: 125 U/L — AB (ref 15–41)
Alkaline Phosphatase: 84 U/L (ref 38–126)
Anion gap: 7 (ref 5–15)
BUN: 17 mg/dL (ref 6–20)
CHLORIDE: 107 mmol/L (ref 101–111)
CO2: 27 mmol/L (ref 22–32)
CREATININE: 0.95 mg/dL (ref 0.61–1.24)
Calcium: 8.9 mg/dL (ref 8.9–10.3)
GFR calc Af Amer: >60 mL/min (ref >60)
GLUCOSE: 108 mg/dL — AB (ref 65–99)
Potassium: 3.5 mmol/L (ref 3.5–5.1)
Sodium: 141 mmol/L (ref 135–145)
Total Bilirubin: 4 mg/dL — ABNORMAL HIGH (ref 0.3–1.2)
Total Protein: 6.1 g/dL — ABNORMAL LOW (ref 6.5–8.1)

## 2015-03-31 LAB — HEPATIC FUNCTION PANEL
ALT: 172 U/L — AB (ref 17–63)
AST: 99 U/L — ABNORMAL HIGH (ref 15–41)
Albumin: 3.1 g/dL — ABNORMAL LOW (ref 3.5–5.0)
Alkaline Phosphatase: 84 U/L (ref 38–126)
BILIRUBIN DIRECT: 2.7 mg/dL — AB (ref 0.1–0.5)
BILIRUBIN INDIRECT: 1 mg/dL — AB (ref 0.3–0.9)
BILIRUBIN TOTAL: 3.7 mg/dL — AB (ref 0.3–1.2)
Total Protein: 6 g/dL — ABNORMAL LOW (ref 6.5–8.1)

## 2015-03-31 LAB — CBC
HCT: 35.3 % — ABNORMAL LOW (ref 40.0–52.0)
HEMATOCRIT: 35.9 % — AB (ref 40.0–52.0)
Hemoglobin: 12.1 g/dL — ABNORMAL LOW (ref 13.0–18.0)
Hemoglobin: 11.9 g/dL — ABNORMAL LOW (ref 13.0–18.0)
MCH: 30.5 pg (ref 26.0–34.0)
MCH: 30.5 pg (ref 26.0–34.0)
MCHC: 33.7 g/dL (ref 32.0–36.0)
MCHC: 33.9 g/dL (ref 32.0–36.0)
MCV: 90.4 fL (ref 80.0–100.0)
MCV: 90 fL (ref 80.0–100.0)
PLATELETS: 145 10*3/uL — AB (ref 150–440)
PLATELETS: 137 10*3/uL — AB (ref 150–440)
RBC: 3.97 MIL/uL — ABNORMAL LOW (ref 4.40–5.90)
RBC: 3.92 MIL/uL — AB (ref 4.40–5.90)
RDW: 13.4 % (ref 11.5–14.5)
RDW: 13.5 % (ref 11.5–14.5)
WBC: 13 10*3/uL — AB (ref 3.8–10.6)
WBC: 13 10*3/uL — ABNORMAL HIGH (ref 3.8–10.6)

## 2015-03-31 LAB — GLUCOSE, CAPILLARY
Glucose-Capillary: 102 mg/dL — ABNORMAL HIGH (ref 65–99)
Glucose-Capillary: 118 mg/dL — ABNORMAL HIGH (ref 65–99)

## 2015-03-31 LAB — AMMONIA: Ammonia: 19 umol/L (ref 9–35)

## 2015-03-31 MED ORDER — OXYCODONE HCL 5 MG PO CAPS: 5.0000 mg | ORAL_CAPSULE | ORAL | Status: DC | PRN

## 2015-03-31 MED ORDER — ATORVASTATIN CALCIUM 20 MG PO TABS: 20.0000 mg | ORAL_TABLET | Freq: Every day | ORAL | Status: DC

## 2015-03-31 NOTE — Progress Notes (Signed)
Notified Dr. Seth BakeV that cardiologist on call is not going to do myoview because of elevated troponins and that surgery consulted with patient and has no plans to do surgery. MD acknowledged and will talk to cardiologist.

## 2015-03-31 NOTE — Consult Note (Signed)
CC: rigors/chills   HPI: David Sloan is an 74 y.o. male with a known history of hypertension, type 2 diabetes without complication, history of previous CVA, diabetic neuropathy, BPH, urinary incontinence who presents to the hospital due to slurred speech, rigors/chills  Past Medical History  Diagnosis Date  . Essential hypertension   . Diabetes mellitus without complication (HCC)   . Stroke [facility](HCC)     a. ~ 2002 - no residual deficits;  b. 03/2015 Head CT: prior old infarcts and foci of small vesel dzs in pons bilat. ? bilat occipital lobe acute infarcts R>L.  . Diabetic neuropathy (HCC)   . BPH (benign prostatic hyperplasia)   . Urinary incontinence   . Atypical chest pain     a. 04/2013 MV: nl EF, no ischemia.  . Osteoarthritis     a. s/p bilat knee surgeries.  . OSA (obstructive sleep apnea)   . Chronic back pain   . Sinus bradycardia   . Falls   . Memory disorder     History reviewed. No pertinent past surgical history.  Family History  Problem Relation Age of Onset  . Alzheimer's disease Mother   . Diabetes Father     Social History:  reports that he has quit smoking. His smoking use included Cigarettes. He has a 25 pack-year smoking history. He does not have any smokeless tobacco history on file. He reports that he does not drink alcohol or use illicit drugs.  No Known Allergies  Medications: I have reviewed the patient's current medications.  ROS: History obtained from the patient  General ROS: negative for - chills, fatigue, fever, night sweats, weight gain or weight loss Psychological ROS: negative for - behavioral disorder, hallucinations, memory difficulties, mood swings or suicidal ideation Ophthalmic ROS: negative for - blurry vision, double vision, eye pain or loss of vision ENT ROS: negative for - epistaxis, nasal discharge, oral lesions, sore throat, tinnitus or vertigo Allergy and Immunology ROS: negative for - hives or itchy/watery  eyes Hematological and Lymphatic ROS: negative for - bleeding problems, bruising or swollen lymph nodes Endocrine ROS: negative for - galactorrhea, hair pattern changes, polydipsia/polyuria or temperature intolerance Respiratory ROS: negative for - cough, hemoptysis, shortness of breath or wheezing Cardiovascular ROS: negative for - chest pain, dyspnea on exertion, edema or irregular heartbeat Gastrointestinal ROS: negative for - abdominal pain, diarrhea, hematemesis, nausea/vomiting or stool incontinence Genito-Urinary ROS: negative for - dysuria, hematuria, incontinence or urinary frequency/urgency Musculoskeletal ROS: negative for - joint swelling or muscular weakness Neurological ROS: as noted in HPI Dermatological ROS: negative for rash and skin lesion changes  Physical Examination: Blood pressure 146/65, pulse 56, temperature 98.7 F (37.1 C), temperature source Oral, resp. rate 18, height 5\' 6"  (1.676 m), weight 92.08 kg (203 lb), SpO2 95 %.   Neurological Examination Mental Status: Alert, oriented, thought content appropriate.  Speech fluent without evidence of aphasia.  Able to follow 3 step commands without difficulty. Cranial Nerves: II: Discs flat bilaterally; Visual fields grossly normal, pupils equal, round, reactive to light and accommodation III,IV, VI: ptosis not present, extra-ocular motions intact bilaterally V,VII: smile symmetric, facial light touch sensation normal bilaterally VIII: hearing normal bilaterally IX,X: gag reflex present XI: bilateral shoulder shrug XII: midline tongue extension Motor: Right : Upper extremity   5/5    Left:     Upper extremity   5/5  Lower extremity   5/5     Lower extremity   5/5 Tone and bulk:normal tone throughout; no atrophy  noted Sensory: Pinprick and light touch intact throughout, bilaterally Deep Tendon Reflexes: 1+ and symmetric throughout Plantars: Right: downgoing   Left: downgoing Cerebellar: normal finger-to-nose,  normal rapid alternating movements and normal heel-to-shin test Gait: not tested      Laboratory Studies:   Basic Metabolic Panel:  Recent Labs Lab 03/30/15 0744 03/31/15 0419  NA 140 141  K 3.6 3.5  CL 106 107  CO2 26 27  GLUCOSE 152* 108*  BUN 16 17  CREATININE 1.23 0.95  CALCIUM 9.1 8.9    Liver Function Tests:  Recent Labs Lab 03/30/15 0744 03/31/15 0419  AST 301* 125*  ALT 232* 194*  ALKPHOS 73 84  BILITOT 2.5* 4.0*  PROT 6.3* 6.1*  ALBUMIN 3.5 3.1*   No results for input(s): LIPASE, AMYLASE in the last 168 hours.  Recent Labs Lab 03/31/15 1208  AMMONIA 19    CBC:  Recent Labs Lab 03/30/15 0744 03/31/15 0419 03/31/15 1208  WBC 16.6* 13.0* 13.0*  NEUTROABS 15.3*  --   --   HGB 12.5* 12.1* 11.9*  HCT 36.7* 35.9* 35.3*  MCV 90.4 90.4 90.0  PLT 161 145* 137*    Cardiac Enzymes:  Recent Labs Lab 03/30/15 0744 03/30/15 1233 03/30/15 1757 03/30/15 2226  TROPONINI 0.21* 0.41* 0.54* 0.54*    BNP: Invalid input(s): POCBNP  CBG:  Recent Labs Lab 03/30/15 1316 03/30/15 1646 03/30/15 2111 03/31/15 0910 03/31/15 1128  GLUCAP 218* 190* 162* 102* 118*    Microbiology: No results found for this or any previous visit.  Coagulation Studies:  Recent Labs  03/30/15 0744  LABPROT 16.6*  INR 1.32    Urinalysis: No results for input(s): COLORURINE, LABSPEC, PHURINE, GLUCOSEU, HGBUR, BILIRUBINUR, KETONESUR, PROTEINUR, UROBILINOGEN, NITRITE, LEUKOCYTESUR in the last 168 hours.  Invalid input(s): APPERANCEUR  Lipid Panel:  No results found for: CHOL, TRIG, HDL, CHOLHDL, VLDL, LDLCALC  HgbA1C: No results found for: HGBA1C  Urine Drug Screen:  No results found for: LABOPIA, COCAINSCRNUR, LABBENZ, AMPHETMU, THCU, LABBARB  Alcohol Level: No results for input(s): ETH in the last 168 hours.   Imaging: Ct Head Wo Contrast  03/30/2015  CLINICAL DATA:  Left-sided weakness and gait disorder EXAM: CT HEAD WITHOUT CONTRAST TECHNIQUE:  Contiguous axial images were obtained from the base of the skull through the vertex without intravenous contrast. COMPARISON:  None. FINDINGS: There is mild diffuse atrophy. There is no intracranial mass, hemorrhage, extra-axial fluid collection, or midline shift. There is evidence of a prior infarct in the inferomedial left occipital lobe. There are old lacunar type infarcts in the centra semiovale bilaterally. There is patchy small vessel disease throughout the centra semiovale currently. Currently, there is decreased attenuation in the mid portions of each occipital lobe which are concerning for potential acute infarcts in these areas. There is also decreased attenuation in the upper to mid pons bilaterally, slightly more on the right than on the left, consistent with basilar perforator distribution disease of uncertain age. There is evidence of a prior small lacunar infarct in the lateral right thalamus. The bony calvarium appears intact. The mastoid air cells are clear. Note that there is a small metallic foreign body overlying the superior right temporal bone. There is mild susceptibility artifact on the right from this metallic foreign body. There are small retention cysts in the left maxillary antrum. There is also mild mucosal thickening in several ethmoid air cells bilaterally. Frontal sinuses are essentially aplastic. IMPRESSION: Atrophy with prior old infarcts and foci of small vessel disease. Age  uncertain small vessel disease in the pons bilaterally. A small acute infarct in this area cannot be excluded. There is decreased attenuation in portions of each occipital lobe which potentially could represent acute infarcts. These changes are slightly more prominent on the right than on the left. Metallic foreign body in the soft tissues overlying the superior right temporal bone. Critical Value/emergent results were called by telephone at the time of interpretation on 03/30/2015 at 7:41 am to Dr. [name] , who verbally acknowledged these results. No hemorrhage or mass effect.  No midline shift. Electronically Signed   By: Bretta Bang III M.D.   On: 03/30/2015 07:41   Ct Angio Neck W/cm &/or Wo/cm  03/30/2015  CLINICAL DATA:  Carotid stenosis. Carotid Doppler today revealed possible left common carotid artery thrombus or dissection. EXAM: CT ANGIOGRAPHY NECK TECHNIQUE: Multidetector CT imaging of the neck was performed using the standard protocol during bolus administration of intravenous contrast. Multiplanar CT image reconstructions and MIPs were obtained to evaluate the vascular anatomy. Carotid stenosis measurements (when applicable) are obtained utilizing NASCET criteria, using the distal internal carotid diameter as the denominator. CONTRAST:  OMNIPAQUE IOHEXOL 350 MG/ML SOLN COMPARISON:  Carotid Doppler 03/30/2015 FINDINGS: Aortic arch: Mild atherosclerotic disease in the aortic arch without dissection or aneurysm. Lung apices are clear. Proximal great vessels patent. Right carotid system: Right common carotid artery widely patent. Mild atherosclerotic calcification in the carotid bifurcation. Less than 25% diameter stenosis. No dissection. Remainder of the internal carotid artery is widely patent. Left carotid system: Left common carotid artery is widely patent without evidence of atherosclerotic disease, stenosis, dissection or thrombus. There was question of thrombus or dissection in the left common carotid artery on the ultrasound. Atherosclerotic calcification in the left carotid bifurcation. Less than 25% diameter stenosis. Negative for stenosis or dissection in the left internal carotid artery. Vertebral arteries:Right vertebral artery dominant. Both vertebral arteries are patent to the basilar without significant stenosis. Mild atherosclerotic disease distal vertebral artery bilaterally. Skeleton: Multilevel advanced spondylosis C3 through C7. Mild anterior slip C7 on T1.  Bilateral facet degeneration at C7-T1. No acute skeletal abnormality. Other neck: Negative for mass or adenopathy in the neck. IMPRESSION: Mild atherosclerotic disease in the carotid bifurcation bilaterally with less than 25% diameter stenosis of the internal carotid artery bilaterally. In the area of concern of the left common carotid artery, no evidence of atherosclerotic disease, dissection, thrombus, or stenosis. This apparently was an artifact on ultrasound. Advanced cervical spondylosis. Electronically Signed   By: Marlan Palau M.D.   On: 03/30/2015 18:51   Mr Brain Wo Contrast  03/30/2015  CLINICAL DATA:  Stroke. EXAM: MRI HEAD WITHOUT CONTRAST TECHNIQUE: Multiplanar, multiecho pulse sequences of the brain and surrounding structures were obtained without intravenous contrast. COMPARISON:  CT head 03/30/2015 FINDINGS: Image quality degraded by mild motion. Fast scanning protocol utilized. Negative for acute infarct. Mild chronic microvascular ischemic change in the cerebral white matter. Small chronic infarcts in the right thalamus and right pons. Chronic infarct left occipital lobe. Mild atrophy.  Negative for hydrocephalus. Negative for hemorrhage or mass lesion. Normal orbit.  Paranasal sinuses clear. IMPRESSION: Atrophy and chronic ischemic changes as above.  No acute infarct. Electronically Signed   By: Marlan Palau M.D.   On: 03/30/2015 19:40   US Carotid Bilateral  03/30/2015  CLINICAL DATA:  CVA. EXAM: BILATERAL CAROTID DUPLEX ULTRASOUND TECHNIQUE: Wallace Cullens scale imaging, color Doppler and duplex ultrasound were performed of bilateral carotid and vertebral arteries in the  neck. COMPARISON:  None. FINDINGS: Criteria: Quantification of carotid stenosis is based on velocity parameters that correlate the residual internal carotid diameter with NASCET-based stenosis levels, using the diameter of the distal internal carotid lumen as the denominator for stenosis measurement. The following velocity  measurements were obtained: RIGHT ICA:  96 cm/sec CCA:  80 cm/sec SYSTOLIC ICA/CCA RATIO:  1.2 DIASTOLIC ICA/CCA RATIO:  0.9 ECA:  181 cm/sec LEFT ICA:  129 cm/sec CCA:  129 cm/sec SYSTOLIC ICA/CCA RATIO:  1.0 DIASTOLIC ICA/CCA RATIO:  1.1 ECA:  126 cm/sec RIGHT CAROTID ARTERY: Minimal echogenic plaque or intimal thickening at the right carotid bulb without stenosis. Echogenic plaque at the origin of the external carotid artery. Small amount of echogenic plaque in the proximal internal carotid artery. Normal waveforms and velocities in the right internal carotid artery. RIGHT VERTEBRAL ARTERY: Antegrade flow and normal waveform in the right vertebral artery. LEFT CAROTID ARTERY: There is a mobile heterogeneous flap or thrombus in the proximal left common carotid artery. Small amount of echogenic plaque at the left carotid bulb. Small amount of echogenic plaque in the left internal carotid artery with normal waveforms. Minimal elevation of the left internal carotid artery peak systolic velocity, measuring 129 cm/sec. Left carotid artery ratios are within normal limits. LEFT VERTEBRAL ARTERY: Antegrade flow and normal waveform in the left vertebral artery. 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 No significant stenosis associated with this lesion but there is risk for thromboembolic disease and difficult to exclude a focal dissection in this region. Consider further characterization with a CTA of the neck. Mild echogenic plaque in the carotid arteries bilaterally. Estimated degree of stenosis in the internal carotid arteries is less than 50% bilaterally. Patent vertebral arteries. These results were called by telephone at the time of interpretation on 03/30/2015 at 4:51 pm to Dr. Hilda Lias , who verbally acknowledged these results. Electronically Signed   By: Richarda Overlie M.D.   On: 03/30/2015 16:53   US Abdomen Limited Ruq  03/30/2015  CLINICAL DATA:  Elevated liver  enzymes EXAM: US ABDOMEN LIMITED - RIGHT UPPER QUADRANT COMPARISON:  None. FINDINGS: Gallbladder: Gallbladder contains sludge with small scattered intermingled gallstones. Largest gallstone measures 4 mm. The gallbladder wall is mildly thickened and edematous. No sonographic Murphy sign noted. Common bile duct: Diameter: 3 mm. There is no intrahepatic or extrahepatic biliary duct dilatation. Liver: No focal lesion identified.  Liver echogenicity is increased. IMPRESSION: Sludge and gallstones within gallbladder. Gallbladder wall thickened and edematous. Suspect a degree of acute cholecystitis. Increased liver echogenicity is most likely due to hepatic steatosis. While no focal liver lesions are identified, it must be cautioned that the sensitivity of ultrasound for focal liver lesions is diminished in this circumstance. Electronically Signed   By: Bretta Bang III M.D.   On: 03/30/2015 09:56     Assessment/Plan:  74 y.o. male with a known history of hypertension, type 2 diabetes without complication, history of previous CVA, diabetic neuropathy, BPH, urinary incontinence who presents to the hospital due to slurred speech, rigors/chills  Now back to baseline.  Was scheduled for myoview but appears it is on hold due to elevated troponin Imaging reviewed no acute intracranial abnormalities.   No further imaging from neuro stand point Call with questions 03/31/2015, 12:55 PM

## 2015-03-31 NOTE — Progress Notes (Signed)
Per Dr. Park BreedKahn, due to elevated troponins the Nuclear Stress test ordered cannot be completed.

## 2015-03-31 NOTE — Discharge Instructions (Signed)
Do not take tylenol or liver harming medication such as percocet due to liver. Cholesterol medication switched to atorvastatin due to liver.

## 2015-03-31 NOTE — Discharge Summary (Signed)
[facility] - [place] [facility]   PATIENT NAME: David Sloan    MR#:  [id number]  DATE OF BIRTH:  01-20-1941  DATE OF ADMISSION:  03/30/2015 ADMITTING PHYSICIAN: Houston Siren, MD  DATE OF DISCHARGE: No discharge date for patient encounter.  PRIMARY CARE PHYSICIAN: No primary care provider on file.     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 Essential hypertension   . Diabetes mellitus without complication (HCC)   . Stroke [facility])     a. ~ 2002 - no residual deficits;  b. 03/2015 Head CT: prior old infarcts and foci of small vesel dzs in pons bilat. ? bilat occipital lobe acute infarcts R>L.  . Diabetic neuropathy (HCC)   . BPH (benign prostatic hyperplasia)   . Urinary incontinence   . Atypical chest pain     a. 04/2013 MV: nl EF, no ischemia.  . Osteoarthritis     a. s/p bilat knee surgeries.  . OSA (obstructive sleep apnea)   . Chronic back pain   . Sinus bradycardia   . Falls   . Memory disorder     .pro HOSPITAL COURSE:   Patient is a 74 year old male with past medical history significant for hypertension, diabetes, stroke, diabetic neuropathy, BPH who presents to the hospital with complaints of bilateral lower extremity weakness and slurred speech. He was also complaining of right versus and chills, nausea and vomiting 1. On arrival to emergency room, he was noted to have elevated LFTs and mildly elevated troponin. Patient was  admitted for evaluation and treatment. Because of concern of stroke which was noted on CT scan of head, patient underwent ultrasound evaluation of carotids which revealed the mobile thrombus in left common carotid artery. CT angiogram of neck however revealed mild atherosclerotic disease in the carotid bifurcation bilaterally with less than 25% stenosis. No dissection or thrombus was noted. MRI of brain also was negative for acute stroke. Patient was treated conservatively and improved clinically. Neurologist recommended no further imaging or additional therapy.  Physical therapist recommended discharge home with no further recommendations. Because of elevated transaminases. Patient had abdominal ultrasound which revealed sludge and gallstones with gallbladder. Gallbladder wall was found to be thickened and edematous. Patient was seen by surgeon, Dr. Egbert Garibaldi, who did not feel that patient had acute cholecystitis and recommended no intervention. However, recommended to follow-up with him in 3-4 weeks. Patient was seen by cardiologist and recommended to follow-up with him as outpatient. Patient was ambulated and had no chest pains, he is to continue aspirin and Pravachol Discussion by problem #1. Lower extremity weakness due to unclear etiology, questionable hydration issue or orthostatic hypotension due to clonidine.No stroke and improved. Discharged home #2. Elevated troponin, likely demand ischemia. Patient was seen by cardiologist and recommended to continue aspirin therapy and Pravachol and follow-up with them as outpatient. Echocardiogram was found to be normal with normal ejection fraction. Myoview stress test was attempted, however, not performed during this admission. Patient is to follow-up with  Dr. Lewie Loron as outpatient for further recommendations #3. Abnormal transaminases of unclear etiology, seems to be chronic, questionable fatty liver disease. Patient would benefit from gastroenterologist as outpatient.  Follow up. We are going to change lovastatin to Lipitor. Liver function tests are improving overall while patient is off lovastatin in the hospital #4. Essential hypertension, relatively well controlled with losartan, clonidine and Norvasc. Follow for Orthostatic hypotension #5. ABGs mellitus type II without complications. Continue outpatient medications #6. Diabetic neuropathy continue Neurontin #7. Urinary incontinence. Continue oxybutynin #8. BPH continue Flomax. #9. Hyperlipidemia. Discontinue lovastatin initiate patient on Lipitor, follow liver function tests as well as lipid panel as outpatient #10. Gastroesophageal reflux disease. Continue PPI.   DISCHARGE CONDITIONS:   Stable  CONSULTS OBTAINED:  Treatment Team:  Antonieta Iba, MD Pauletta Browns, MD Natale Lay, MD  DRUG ALLERGIES:  No Known Allergies  DISCHARGE MEDICATIONS:   Current Discharge Medication List    START taking these medications   Details  atorvastatin (LIPITOR) 20 MG tablet Take 1 tablet (20 mg total) by mouth daily. Qty: 30 tablet, Refills: 4    oxycodone (OXY-IR) 5 MG capsule Take 1 capsule (5 mg total) by mouth every 4 (four) hours as needed. Qty: 40 capsule, Refills: 0      CONTINUE these medications which have NOT CHANGED   Details  amLODipine (NORVASC) 5 MG tablet Take 5 mg by mouth daily.    cloNIDine (CATAPRES) 0.1 MG tablet Take 0.2 mg by mouth daily.    dipyridamole-aspirin (AGGRENOX) 200-25 MG 12hr capsule Take 1 capsule by mouth 2 (two) times daily.    gabapentin (NEURONTIN) 300 MG capsule Take 1-2 capsules by mouth 2 (two) times daily. Take 1 capsule in the morning and 2 capsule in the evening.    insulin NPH Human (HUMULIN N) 100 UNIT/ML injection Inject 30-35 Units into the skin 2 (two) times daily. 35 units before breakfast and 30 units before supper.    losartan (COZAAR) 100 MG tablet Take 100 mg by mouth daily.    oxybutynin (DITROPAN XL) 5 MG 24 hr tablet Take 5 mg by mouth  daily.    pantoprazole (PROTONIX) 40 MG tablet Take 40 mg by mouth 2 (two) times daily.    tamsulosin (FLOMAX) 0.4 MG CAPS capsule Take 0.4 mg by mouth daily.      STOP taking these medications     lovastatin (MEVACOR) 40 MG tablet      oxyCODONE-acetaminophen (PERCOCET) 7.5-325 MG tablet          DISCHARGE INSTRUCTIONS:    Patient is to follow-up with primary care physician and Dr. Lewie Loron as outpatient within 1 week after discharge . Patient is to follow-up with Dr. Egbert Garibaldi in 3 weeks after discharge  If you experience worsening of your admission symptoms, develop shortness of breath, life threatening emergency, suicidal or homicidal thoughts you must seek medical attention immediately by calling 911 or calling your MD immediately  if symptoms less severe.  You Must read complete instructions/literature along with all the possible adverse reactions/side effects for all the Medicines you take and that have been prescribed to you. Take any new Medicines after you have completely understood and accept all the possible adverse reactions/side effects.   Please note  You were cared for by a hospitalist during your hospital stay. If you have any questions about your discharge medications or the care you received while you were in the hospital after you are discharged, you can call the unit and asked to  speak with the hospitalist on call if the hospitalist that took care of you is not available. Once you are discharged, your primary care physician will handle any further medical issues. Please note that NO REFILLS for any discharge medications will be authorized once you are discharged, as it is imperative that you return to your primary care physician (or establish a relationship with a primary care physician if you do not have one) for your aftercare needs so that they can reassess your need for medications and monitor your lab values.    Today   CHIEF COMPLAINT:   Chief Complaint  Patient  presents with  . Code Stroke    HISTORY OF PRESENT ILLNESS:  Braven Wolk  is a 74 y.o. male with a known history of hypertension, diabetes, stroke, diabetic neuropathy, BPH who presents to the hospital with complaints of bilateral lower extremity weakness and slurred speech. He was also complaining of right versus and chills, nausea and vomiting 1. On arrival to emergency room, he was noted to have elevated LFTs and mildly elevated troponin. Patient was admitted for evaluation and treatment. Because of concern of stroke which was noted on CT scan of head, patient underwent ultrasound evaluation of carotids which revealed the mobile thrombus in left common carotid artery. CT angiogram of neck however revealed mild atherosclerotic disease in the carotid bifurcation bilaterally with less than 25% stenosis. No dissection or thrombus was noted. MRI of brain also was negative for acute stroke. Patient was treated conservatively and improved clinically. Neurologist recommended no further imaging or additional therapy.  Physical therapist recommended discharge home with no further recommendations. Because of elevated transaminases. Patient had abdominal ultrasound which revealed sludge and gallstones with gallbladder. Gallbladder wall was found to be thickened and edematous. Patient was seen by surgeon, Dr. Egbert Garibaldi, who did not feel that patient had acute cholecystitis and recommended no intervention. However, recommended to follow-up with him in 3-4 weeks. Patient was seen by cardiologist and recommended to follow-up with him as outpatient. Patient was ambulated and had no chest pains, he is to continue aspirin and Pravachol Discussion by problem #1. Lower extremity weakness due to unclear etiology, questionable hydration issue or orthostatic hypotension due to clonidine.No stroke and improved. Discharged home #2. Elevated troponin, likely demand ischemia. Patient was seen by cardiologist and recommended to  continue aspirin therapy and Pravachol and follow-up with them as outpatient. Echocardiogram was found to be normal with normal ejection fraction. Myoview stress test was attempted, however, not performed during this admission. Patient is to follow-up with Dr. Lewie Loron as outpatient for further recommendations #3. Abnormal transaminases of unclear etiology, seems to be chronic, questionable fatty liver disease. Patient would benefit from gastroenterologist as outpatient. Follow up. We are going to change lovastatin to Lipitor. Liver function tests are improving overall while patient is off lovastatin in the hospital #4. Essential hypertension, relatively well controlled with losartan, clonidine and Norvasc. Follow for Orthostatic hypotension #5. ABGs mellitus type II without complications. Continue outpatient medications #6. Diabetic neuropathy continue Neurontin #7. Urinary incontinence. Continue oxybutynin #8. BPH continue Flomax. #9. Hyperlipidemia. Discontinue lovastatin initiate patient on Lipitor, follow liver function tests as well as lipid panel as outpatient #10. Gastroesophageal reflux disease. Continue PPI.     VITAL SIGNS:  Blood pressure 113/45, pulse 57, temperature 97.5 F (36.4 C), temperature source Oral, resp. rate 19, height 5\' 6"  (1.676 m), weight 92.08 kg (203 lb), SpO2 99 %.  I/O:   Intake/Output Summary (Last 24 hours) at 03/31/15  1425 Last data filed at 03/31/15 0347  Gross per 24 hour  Intake      0 ml  Output    350 ml  Net   -350 ml    PHYSICAL EXAMINATION:  GENERAL:  74 y.o.-year-old patient lying in the bed with no acute distress.  EYES: Pupils equal, round, reactive to light and accommodation. No scleral icterus. Extraocular muscles intact.  HEENT: Head atraumatic, normocephalic. Oropharynx and nasopharynx clear.  NECK:  Supple, no jugular venous distention. No thyroid enlargement, no tenderness.  LUNGS: Normal breath sounds bilaterally, no wheezing,  rales,rhonchi or crepitation. No use of accessory muscles of respiration.  CARDIOVASCULAR: S1, S2 normal. No murmurs, rubs, or gallops.  ABDOMEN: Soft, non-tender, non-distended. Bowel sounds present. No organomegaly or mass.  EXTREMITIES: No pedal edema, cyanosis, or clubbing.  NEUROLOGIC: Cranial nerves II through XII are intact. Muscle strength 5/5 in all extremities. Sensation intact. Gait not checked.  PSYCHIATRIC: The patient is alert and oriented x 3.  SKIN: No obvious rash, lesion, or ulcer.   DATA REVIEW:   CBC  Recent Labs Lab 03/31/15 1208  WBC 13.0*  HGB 11.9*  HCT 35.3*  PLT 137*    Chemistries   Recent Labs Lab 03/31/15 0419 03/31/15 1208  NA 141  --   K 3.5  --   CL 107  --   CO2 27  --   GLUCOSE 108*  --   BUN 17  --   CREATININE 0.95  --   CALCIUM 8.9  --   AST 125* 99*  ALT 194* 172*  ALKPHOS 84 84  BILITOT 4.0* 3.7*    Cardiac Enzymes  Recent Labs Lab 03/30/15 2226  TROPONINI 0.54*    Microbiology Results  No results found for this or any previous visit.  RADIOLOGY:  Ct Head Wo Contrast  03/30/2015  CLINICAL DATA:  Left-sided weakness and gait disorder EXAM: CT HEAD WITHOUT CONTRAST TECHNIQUE: Contiguous axial images were obtained from the base of the skull through the vertex without intravenous contrast. COMPARISON:  None. FINDINGS: There is mild diffuse atrophy. There is no intracranial mass, hemorrhage, extra-axial fluid collection, or midline shift. There is evidence of a prior infarct in the inferomedial left occipital lobe. There are old lacunar type infarcts in the centra semiovale bilaterally. There is patchy small vessel disease throughout the centra semiovale currently. Currently, there is decreased attenuation in the mid portions of each occipital lobe which are concerning for potential acute infarcts in these areas. There is also decreased attenuation in the upper to mid pons bilaterally, slightly more on the right than on the  left, consistent with basilar perforator distribution disease of uncertain age. There is evidence of a prior small lacunar infarct in the lateral right thalamus. The bony calvarium appears intact. The mastoid air cells are clear. Note that there is a small metallic foreign body overlying the superior right temporal bone. There is mild susceptibility artifact on the right from this metallic foreign body. There are small retention cysts in the left maxillary antrum. There is also mild mucosal thickening in several ethmoid air cells bilaterally. Frontal sinuses are essentially aplastic. IMPRESSION: Atrophy with prior old infarcts and foci of small vessel disease. Age uncertain small vessel disease in the pons bilaterally. A small acute infarct in this area cannot be excluded. There is decreased attenuation in portions of each occipital lobe which potentially could represent acute infarcts. These changes are slightly more prominent on the right than on the left.  Metallic foreign body in the soft tissues overlying the superior right temporal bone. Critical Value/emergent results were called by telephone at the time of interpretation on 03/30/2015 at 7:41 am to Dr. Minna AntisKEVIN PADUCHOWSKI , who verbally acknowledged these results. No hemorrhage or mass effect.  No midline shift. Electronically Signed   By: Bretta BangWilliam  Woodruff III M.D.   On: 03/30/2015 07:41   Ct Angio Neck W/cm &/or Wo/cm  03/30/2015  CLINICAL DATA:  Carotid stenosis. Carotid Doppler today revealed possible left common carotid artery thrombus or dissection. EXAM: CT ANGIOGRAPHY NECK TECHNIQUE: Multidetector CT imaging of the neck was performed using the standard protocol during bolus administration of intravenous contrast. Multiplanar CT image reconstructions and MIPs were obtained to evaluate the vascular anatomy. Carotid stenosis measurements (when applicable) are obtained utilizing NASCET criteria, using the distal internal carotid diameter as the  denominator. CONTRAST:  100mL OMNIPAQUE IOHEXOL 350 MG/ML SOLN COMPARISON:  Carotid Doppler 03/30/2015 FINDINGS: Aortic arch: Mild atherosclerotic disease in the aortic arch without dissection or aneurysm. Lung apices are clear. Proximal great vessels patent. Right carotid system: Right common carotid artery widely patent. Mild atherosclerotic calcification in the carotid bifurcation. Less than 25% diameter stenosis. No dissection. Remainder of the internal carotid artery is widely patent. Left carotid system: Left common carotid artery is widely patent without evidence of atherosclerotic disease, stenosis, dissection or thrombus. There was question of thrombus or dissection in the left common carotid artery on the ultrasound. Atherosclerotic calcification in the left carotid bifurcation. Less than 25% diameter stenosis. Negative for stenosis or dissection in the left internal carotid artery. Vertebral arteries:Right vertebral artery dominant. Both vertebral arteries are patent to the basilar without significant stenosis. Mild atherosclerotic disease distal vertebral artery bilaterally. Skeleton: Multilevel advanced spondylosis C3 through C7. Mild anterior slip C7 on T1. Bilateral facet degeneration at C7-T1. No acute skeletal abnormality. Other neck: Negative for mass or adenopathy in the neck. IMPRESSION: Mild atherosclerotic disease in the carotid bifurcation bilaterally with less than 25% diameter stenosis of the internal carotid artery bilaterally. In the area of concern of the left common carotid artery, no evidence of atherosclerotic disease, dissection, thrombus, or stenosis. This apparently was an artifact on ultrasound. Advanced cervical spondylosis. Electronically Signed   By: Marlan Palauharles  Clark M.D.   On: 03/30/2015 18:51   Mr Brain Wo Contrast  03/30/2015  CLINICAL DATA:  Stroke. EXAM: MRI HEAD WITHOUT CONTRAST TECHNIQUE: Multiplanar, multiecho pulse sequences of the brain and surrounding structures  were obtained without intravenous contrast. COMPARISON:  CT head 03/30/2015 FINDINGS: Image quality degraded by mild motion. Fast scanning protocol utilized. Negative for acute infarct. Mild chronic microvascular ischemic change in the cerebral white matter. Small chronic infarcts in the right thalamus and right pons. Chronic infarct left occipital lobe. Mild atrophy.  Negative for hydrocephalus. Negative for hemorrhage or mass lesion. Normal orbit.  Paranasal sinuses clear. IMPRESSION: Atrophy and chronic ischemic changes as above.  No acute infarct. Electronically Signed   By: Marlan Palauharles  Clark M.D.   On: 03/30/2015 19:40   [place] Carotid Bilateral  03/30/2015  CLINICAL DATA:  CVA. EXAM: BILATERAL CAROTID DUPLEX ULTRASOUND TECHNIQUE: Wallace CullensGray scale imaging, color Doppler and duplex ultrasound were performed of bilateral carotid and vertebral arteries in the neck. COMPARISON:  None. FINDINGS: Criteria: Quantification of carotid stenosis is based on velocity parameters that correlate the residual internal carotid diameter with NASCET-based stenosis levels, using the diameter of the distal internal carotid lumen as the denominator for stenosis measurement. The following velocity measurements were obtained:  RIGHT ICA:  96 cm/sec CCA:  80 cm/sec SYSTOLIC ICA/CCA RATIO:  1.2 DIASTOLIC ICA/CCA RATIO:  0.9 ECA:  181 cm/sec LEFT ICA:  129 cm/sec CCA:  129 cm/sec SYSTOLIC ICA/CCA RATIO:  1.0 DIASTOLIC ICA/CCA RATIO:  1.1 ECA:  126 cm/sec RIGHT CAROTID ARTERY: Minimal echogenic plaque or intimal thickening at the right carotid bulb without stenosis. Echogenic plaque at the origin of the external carotid artery. Small amount of echogenic plaque in the proximal internal carotid artery. Normal waveforms and velocities in the right internal carotid artery. RIGHT VERTEBRAL ARTERY: Antegrade flow and normal waveform in the right vertebral artery. LEFT CAROTID ARTERY: There is a mobile heterogeneous flap or thrombus in the proximal left  common carotid artery. Small amount of echogenic plaque at the left carotid bulb. Small amount of echogenic plaque in the left internal carotid artery with normal waveforms. Minimal elevation of the left internal carotid artery peak systolic velocity, measuring 129 cm/sec. Left carotid artery ratios are within normal limits. LEFT VERTEBRAL ARTERY: Antegrade flow and normal waveform in the left vertebral artery. 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 No significant stenosis associated with this lesion but there is risk for thromboembolic disease and difficult to exclude a focal dissection in this region. Consider further characterization with a CTA of the neck. Mild echogenic plaque in the carotid arteries bilaterally. Estimated degree of stenosis in the internal carotid arteries is less than 50% bilaterally. Patent vertebral arteries. These results were called by telephone at the time of interpretation on 03/30/2015 at 4:51 pm to Dr. Hilda Lias , who verbally acknowledged these results. Electronically Signed   By: Richarda Overlie M.D.   On: 03/30/2015 16:53   US Abdomen Limited Ruq  03/30/2015  CLINICAL DATA:  Elevated liver enzymes EXAM: US ABDOMEN LIMITED - RIGHT UPPER QUADRANT COMPARISON:  None. FINDINGS: Gallbladder: Gallbladder contains sludge with small scattered intermingled gallstones. Largest gallstone measures 4 mm. The gallbladder wall is mildly thickened and edematous. No sonographic Murphy sign noted. Common bile duct: Diameter: 3 mm. There is no intrahepatic or extrahepatic biliary duct dilatation. Liver: No focal lesion identified.  Liver echogenicity is increased. IMPRESSION: Sludge and gallstones within gallbladder. Gallbladder wall thickened and edematous. Suspect a degree of acute cholecystitis. Increased liver echogenicity is most likely due to hepatic steatosis. While no focal liver lesions are identified, it must be cautioned that the sensitivity of  ultrasound for focal liver lesions is diminished in this circumstance. Electronically Signed   By: Bretta Bang III M.D.   On: 03/30/2015 09:56    EKG:   Orders placed or performed during the hospital encounter of 03/30/15  . ED EKG  . ED EKG      Management plans discussed with the patient, family and they are in agreement.  CODE STATUS:     Code Status Orders        Start     Ordered   03/30/15 1129  Full code   Continuous     03/30/15 1129      TOTAL TIME TAKING CARE OF THIS PATIENT: 40  minutes.    Katharina Caper M.D on 03/31/2015 at 2:25 PM  Between 7am to 6pm - Pager - [phone]  After 6pm go to [email or web address] - password [email or web address] [facility]  [facility]  Office  [phone]  CC: Primary care physician; No primary care provider on file.

## 2015-03-31 NOTE — Progress Notes (Signed)
Patient ID: David Sloan, male   DOB: November 22, 1940, 74 y.o.   MRN: [id number]   Patient ID: David Sloan, male   DOB: 1940/11/03, 74 y.o.   MRN: [id number]  Chief Complaint  Patient presents with  . Code Stroke    HPI Location, Quality, Duration, Severity, Timing, Context, Modifying Factors, Associated Signs and Symptoms.  David Sloan is a 74 y.o. male. who was admitted to the medical service with stroke symptoms them having a TIA and elevated troponins. Cardiology and neurology were involved. Of note the patient had elevated liver function tests which prompted an ultrasound which demonstrated stones and sludge. The patient denies any nausea or vomiting or abdominal pain or postprandial fatty food intolerance. Her function tests remain elevated however he has had a history of this in the past. The patient has a history of diabetes hypertension coronary artery disease chronic back pain. Because of the read of the ultrasound and surgical consult was requested. At present the patient denies any abdominal pain nausea vomiting diarrhea anorexia. He is currently nothing by mouth for a nuclear medicine study of his heart ordered by cardiology. Patient is denies any history of jaundice.   Past Medical History  Diagnosis Date  . Essential hypertension   . Diabetes mellitus without complication ([place])   . Stroke [facility])     a. ~ 2002 - no residual deficits;  b. 03/2015 Head CT: prior old infarcts and foci of small vesel dzs in pons bilat. ? bilat occipital lobe acute infarcts R>L.  . Diabetic neuropathy ([place])   . BPH (benign prostatic hyperplasia)   . Urinary incontinence   . Atypical chest pain     a. 04/2013 MV: nl EF, no ischemia.  . Osteoarthritis     a. s/p bilat knee surgeries.  . OSA (obstructive sleep apnea)   . Chronic back pain   . Sinus bradycardia   . Falls   . Memory disorder     History reviewed. No pertinent past surgical history.  Family History  Problem Relation Age of  Onset  . Alzheimer's disease Mother   . Diabetes Father     Social History Social History  Substance Use Topics  . Smoking status: Former Smoker -- 1.00 packs/day for 25 years    Types: Cigarettes  . Smokeless tobacco: None  . Alcohol Use: No    No Known Allergies  Current Facility-Administered Medications  Medication Dose Route Frequency Provider Last Rate Last Dose  . acetaminophen (TYLENOL) tablet 650 mg  650 mg Oral Q6H PRN Henreitta Leber, MD       Or  . acetaminophen (TYLENOL) suppository 650 mg  650 mg Rectal Q6H PRN Henreitta Leber, MD      . amLODipine (NORVASC) tablet 5 mg  5 mg Oral Daily Henreitta Leber, MD   5 mg at 03/31/15 0917  . cloNIDine (CATAPRES) tablet 0.2 mg  0.2 mg Oral Daily Henreitta Leber, MD   0.2 mg at 03/31/15 0918  . dipyridamole-aspirin (AGGRENOX) 200-25 MG per 12 hr capsule 1 capsule  1 capsule Oral BID Henreitta Leber, MD   1 capsule at 03/31/15 0917  . enoxaparin (LOVENOX) injection 40 mg  40 mg Subcutaneous Q24H Henreitta Leber, MD   40 mg at 03/30/15 1409  . gabapentin (NEURONTIN) capsule 300 mg  300 mg Oral q morning - 10a Henreitta Leber, MD   300 mg at 03/31/15 0918  . gabapentin (NEURONTIN) capsule 600 mg  600 mg Oral QPM Henreitta Leber, MD   600 mg at 03/30/15 1747  . insulin detemir (LEVEMIR) injection 30 Units  30 Units Subcutaneous Q supper Henreitta Leber, MD   30 Units at 03/30/15 1747  . insulin detemir (LEVEMIR) injection 35 Units  35 Units Subcutaneous Q breakfast Henreitta Leber, MD   35 Units at 03/31/15 [phone]  . losartan (COZAAR) tablet 100 mg  100 mg Oral Daily Henreitta Leber, MD   100 mg at 03/31/15 0917  . ondansetron (ZOFRAN) tablet 4 mg  4 mg Oral Q6H PRN Henreitta Leber, MD       Or  . ondansetron (ZOFRAN) injection 4 mg  4 mg Intravenous Q6H PRN Henreitta Leber, MD      . oxybutynin (DITROPAN-XL) 24 hr tablet 5 mg  5 mg Oral Daily Henreitta Leber, MD   5 mg at 03/31/15 0917  . oxyCODONE-acetaminophen (PERCOCET) 7.5-325 MG  per tablet 1 tablet  1 tablet Oral Q6H PRN Henreitta Leber, MD   1 tablet at 03/31/15 0706  . pantoprazole (PROTONIX) EC tablet 40 mg  40 mg Oral BID Henreitta Leber, MD   40 mg at 03/31/15 0917  . pravastatin (PRAVACHOL) tablet 40 mg  40 mg Oral q1800 Henreitta Leber, MD   40 mg at 03/30/15 1747  . sodium chloride 0.9 % injection 3 mL  3 mL Intravenous Q12H Henreitta Leber, MD   3 mL at 03/30/15 2123  . tamsulosin (FLOMAX) capsule 0.4 mg  0.4 mg Oral Daily Henreitta Leber, MD   0.4 mg at 03/31/15 8101      Review of Systems A 10 point review of systems was asked and was negative except for the following positive findings  Blood pressure 152/60, pulse 82, temperature 98.7 F (37.1 C), temperature source Oral, resp. rate 18, height '5\' 6"'  (1.676 m), weight 203 lb (92.08 kg), SpO2 97 %.  Results for orders placed or performed during the hospital encounter of 03/30/15 (from the past 48 hour(s))  Protime-INR     Status: Abnormal   Collection Time: 03/30/15  7:44 AM  Result Value Ref Range   Prothrombin Time 16.6 (H) 11.4 - 15.0 seconds   INR 1.32   APTT     Status: None   Collection Time: 03/30/15  7:44 AM  Result Value Ref Range   aPTT 32 24 - 36 seconds  CBC     Status: Abnormal   Collection Time: 03/30/15  7:44 AM  Result Value Ref Range   WBC 16.6 (H) 3.8 - 10.6 K/uL   RBC 4.06 (L) 4.40 - 5.90 MIL/uL   Hemoglobin 12.5 (L) 13.0 - 18.0 g/dL   HCT 36.7 (L) 40.0 - 52.0 %   MCV 90.4 80.0 - 100.0 fL   MCH 30.9 26.0 - 34.0 pg   MCHC 34.1 32.0 - 36.0 g/dL   RDW 13.0 11.5 - 14.5 %   Platelets 161 150 - 440 K/uL  Differential     Status: Abnormal   Collection Time: 03/30/15  7:44 AM  Result Value Ref Range   Neutrophils Relative % 93 %   Neutro Abs 15.3 (H) 1.4 - 6.5 K/uL   Lymphocytes Relative 1 %   Lymphs Abs 0.2 (L) 1.0 - 3.6 K/uL   Monocytes Relative 6 %   Monocytes Absolute 1.0 0.2 - 1.0 K/uL   Eosinophils Relative 0 %   Eosinophils Absolute 0.0 0 - 0.7 K/uL  Basophils  Relative 0 %   Basophils Absolute 0.1 0 - 0.1 K/uL  Comprehensive metabolic panel     Status: Abnormal   Collection Time: 03/30/15  7:44 AM  Result Value Ref Range   Sodium 140 135 - 145 mmol/L   Potassium 3.6 3.5 - 5.1 mmol/L   Chloride 106 101 - 111 mmol/L   CO2 26 22 - 32 mmol/L   Glucose, Bld 152 (H) 65 - 99 mg/dL   BUN 16 6 - 20 mg/dL   Creatinine, Ser 1.23 0.61 - 1.24 mg/dL   Calcium 9.1 8.9 - 10.3 mg/dL   Total Protein 6.3 (L) 6.5 - 8.1 g/dL   Albumin 3.5 3.5 - 5.0 g/dL   AST 301 (H) 15 - 41 U/L   ALT 232 (H) 17 - 63 U/L   Alkaline Phosphatase 73 38 - 126 U/L   Total Bilirubin 2.5 (H) 0.3 - 1.2 mg/dL   GFR calc non Af Amer 57 (L) >60 mL/min   GFR calc Af Amer >60 >60 mL/min    Comment: (NOTE) The eGFR has been calculated using the CKD EPI equation. This calculation has not been validated in all clinical situations. eGFR's persistently <60 mL/min signify possible Chronic Kidney Disease.    Anion gap 8 5 - 15  Troponin I     Status: Abnormal   Collection Time: 03/30/15  7:44 AM  Result Value Ref Range   Troponin I 0.21 (H) <0.031 ng/mL    Comment: READ BACK AND VERIFIED MARY NEEDHAM'@0825'  ON 03/30/15 BY [facility]        PERSISTENTLY INCREASED TROPONIN VALUES IN THE RANGE OF 0.04-0.49 ng/mL CAN BE SEEN IN:       -UNSTABLE ANGINA       -CONGESTIVE HEART FAILURE       -MYOCARDITIS       -CHEST TRAUMA       -ARRYHTHMIAS       -LATE PRESENTING MYOCARDIAL INFARCTION       -COPD   CLINICAL FOLLOW-UP RECOMMENDED.   Troponin I     Status: Abnormal   Collection Time: 03/30/15 12:33 PM  Result Value Ref Range   Troponin I 0.41 (H) <0.031 ng/mL    Comment: READ BACK AND VERIFIED WITH ERIN PARDINI 03/30/15 1345 [facility]        PERSISTENTLY INCREASED TROPONIN VALUES IN THE RANGE OF 0.04-0.49 ng/mL CAN BE SEEN IN:       -UNSTABLE ANGINA       -CONGESTIVE HEART FAILURE       -MYOCARDITIS       -CHEST TRAUMA       -ARRYHTHMIAS       -LATE PRESENTING MYOCARDIAL INFARCTION        -COPD   CLINICAL FOLLOW-UP RECOMMENDED.   Glucose, capillary     Status: Abnormal   Collection Time: 03/30/15  1:16 PM  Result Value Ref Range   Glucose-Capillary 218 (H) 65 - 99 mg/dL  Glucose, capillary     Status: Abnormal   Collection Time: 03/30/15  4:46 PM  Result Value Ref Range   Glucose-Capillary 190 (H) 65 - 99 mg/dL  Troponin I     Status: Abnormal   Collection Time: 03/30/15  5:57 PM  Result Value Ref Range   Troponin I 0.54 (H) <0.031 ng/mL    Comment: RESULTS PREVIOUSLY CALLED BY [facility] AT 1345 ON 03/30/15 RWW        POSSIBLE MYOCARDIAL ISCHEMIA. SERIAL TESTING RECOMMENDED.   Glucose, capillary  Status: Abnormal   Collection Time: 03/30/15  9:11 PM  Result Value Ref Range   Glucose-Capillary 162 (H) 65 - 99 mg/dL   Comment 1 Notify RN   Troponin I     Status: Abnormal   Collection Time: 03/30/15 10:26 PM  Result Value Ref Range   Troponin I 0.54 (H) <0.031 ng/mL    Comment: RESULTS PREVIOUSLY CALLED BY [facility] AT 8295 ON 03/30/15 RWW        POSSIBLE MYOCARDIAL ISCHEMIA. SERIAL TESTING RECOMMENDED.   Comprehensive metabolic panel     Status: Abnormal   Collection Time: 03/31/15  4:19 AM  Result Value Ref Range   Sodium 141 135 - 145 mmol/L   Potassium 3.5 3.5 - 5.1 mmol/L   Chloride 107 101 - 111 mmol/L   CO2 27 22 - 32 mmol/L   Glucose, Bld 108 (H) 65 - 99 mg/dL   BUN 17 6 - 20 mg/dL   Creatinine, Ser 0.95 0.61 - 1.24 mg/dL   Calcium 8.9 8.9 - 10.3 mg/dL   Total Protein 6.1 (L) 6.5 - 8.1 g/dL   Albumin 3.1 (L) 3.5 - 5.0 g/dL   AST 125 (H) 15 - 41 U/L   ALT 194 (H) 17 - 63 U/L   Alkaline Phosphatase 84 38 - 126 U/L   Total Bilirubin 4.0 (H) 0.3 - 1.2 mg/dL   GFR calc non Af Amer >60 >60 mL/min   GFR calc Af Amer >60 >60 mL/min    Comment: (NOTE) The eGFR has been calculated using the CKD EPI equation. This calculation has not been validated in all clinical situations. eGFR's persistently <60 mL/min signify possible Chronic Kidney Disease.     Anion gap 7 5 - 15  CBC     Status: Abnormal   Collection Time: 03/31/15  4:19 AM  Result Value Ref Range   WBC 13.0 (H) 3.8 - 10.6 K/uL   RBC 3.97 (L) 4.40 - 5.90 MIL/uL   Hemoglobin 12.1 (L) 13.0 - 18.0 g/dL   HCT 35.9 (L) 40.0 - 52.0 %   MCV 90.4 80.0 - 100.0 fL   MCH 30.5 26.0 - 34.0 pg   MCHC 33.7 32.0 - 36.0 g/dL   RDW 13.4 11.5 - 14.5 %   Platelets 145 (L) 150 - 440 K/uL  Glucose, capillary     Status: Abnormal   Collection Time: 03/31/15  9:10 AM  Result Value Ref Range   Glucose-Capillary 102 (H) 65 - 99 mg/dL   Ct Head Wo Contrast  03/30/2015  CLINICAL DATA:  Left-sided weakness and gait disorder EXAM: CT HEAD WITHOUT CONTRAST TECHNIQUE: Contiguous axial images were obtained from the base of the skull through the vertex without intravenous contrast. COMPARISON:  None. FINDINGS: There is mild diffuse atrophy. There is no intracranial mass, hemorrhage, extra-axial fluid collection, or midline shift. There is evidence of a prior infarct in the inferomedial left occipital lobe. There are old lacunar type infarcts in the centra semiovale bilaterally. There is patchy small vessel disease throughout the centra semiovale currently. Currently, there is decreased attenuation in the mid portions of each occipital lobe which are concerning for potential acute infarcts in these areas. There is also decreased attenuation in the upper to mid pons bilaterally, slightly more on the right than on the left, consistent with basilar perforator distribution disease of uncertain age. There is evidence of a prior small lacunar infarct in the lateral right thalamus. The bony calvarium appears intact. The mastoid air cells are clear.  Note that there is a small metallic foreign body overlying the superior right temporal bone. There is mild susceptibility artifact on the right from this metallic foreign body. There are small retention cysts in the left maxillary antrum. There is also mild mucosal thickening in  several ethmoid air cells bilaterally. Frontal sinuses are essentially aplastic. IMPRESSION: Atrophy with prior old infarcts and foci of small vessel disease. Age uncertain small vessel disease in the pons bilaterally. A small acute infarct in this area cannot be excluded. There is decreased attenuation in portions of each occipital lobe which potentially could represent acute infarcts. These changes are slightly more prominent on the right than on the left. Metallic foreign body in the soft tissues overlying the superior right temporal bone. Critical Value/emergent results were called by telephone at the time of interpretation on 03/30/2015 at 7:41 am to Dr. Harvest Dark , who verbally acknowledged these results. No hemorrhage or mass effect.  No midline shift. Electronically Signed   By: Lowella Grip III M.D.   On: 03/30/2015 07:41   Ct Angio Neck W/cm &/or Wo/cm  03/30/2015  CLINICAL DATA:  Carotid stenosis. Carotid Doppler today revealed possible left common carotid artery thrombus or dissection. EXAM: CT ANGIOGRAPHY NECK TECHNIQUE: Multidetector CT imaging of the neck was performed using the standard protocol during bolus administration of intravenous contrast. Multiplanar CT image reconstructions and MIPs were obtained to evaluate the vascular anatomy. Carotid stenosis measurements (when applicable) are obtained utilizing NASCET criteria, using the distal internal carotid diameter as the denominator. CONTRAST:  140m OMNIPAQUE IOHEXOL 350 MG/ML SOLN COMPARISON:  Carotid Doppler 03/30/2015 FINDINGS: Aortic arch: Mild atherosclerotic disease in the aortic arch without dissection or aneurysm. Lung apices are clear. Proximal great vessels patent. Right carotid system: Right common carotid artery widely patent. Mild atherosclerotic calcification in the carotid bifurcation. Less than 25% diameter stenosis. No dissection. Remainder of the internal carotid artery is widely patent. Left carotid system:  Left common carotid artery is widely patent without evidence of atherosclerotic disease, stenosis, dissection or thrombus. There was question of thrombus or dissection in the left common carotid artery on the ultrasound. Atherosclerotic calcification in the left carotid bifurcation. Less than 25% diameter stenosis. Negative for stenosis or dissection in the left internal carotid artery. Vertebral arteries:Right vertebral artery dominant. Both vertebral arteries are patent to the basilar without significant stenosis. Mild atherosclerotic disease distal vertebral artery bilaterally. Skeleton: Multilevel advanced spondylosis C3 through C7. Mild anterior slip C7 on T1. Bilateral facet degeneration at C7-T1. No acute skeletal abnormality. Other neck: Negative for mass or adenopathy in the neck. IMPRESSION: Mild atherosclerotic disease in the carotid bifurcation bilaterally with less than 25% diameter stenosis of the internal carotid artery bilaterally. In the area of concern of the left common carotid artery, no evidence of atherosclerotic disease, dissection, thrombus, or stenosis. This apparently was an artifact on ultrasound. Advanced cervical spondylosis. Electronically Signed   By: CFranchot GalloM.D.   On: 03/30/2015 18:51   Mr Brain Wo Contrast  03/30/2015  CLINICAL DATA:  Stroke. EXAM: MRI HEAD WITHOUT CONTRAST TECHNIQUE: Multiplanar, multiecho pulse sequences of the brain and surrounding structures were obtained without intravenous contrast. COMPARISON:  CT head 03/30/2015 FINDINGS: Image quality degraded by mild motion. Fast scanning protocol utilized. Negative for acute infarct. Mild chronic microvascular ischemic change in the cerebral white matter. Small chronic infarcts in the right thalamus and right pons. Chronic infarct left occipital lobe. Mild atrophy.  Negative for hydrocephalus. Negative for hemorrhage or mass lesion.  Normal orbit.  Paranasal sinuses clear. IMPRESSION: Atrophy and chronic  ischemic changes as above.  No acute infarct. Electronically Signed   By: Franchot Gallo M.D.   On: 03/30/2015 19:40   US Carotid Bilateral  03/30/2015  CLINICAL DATA:  CVA. EXAM: BILATERAL CAROTID DUPLEX ULTRASOUND TECHNIQUE: Pearline Cables scale imaging, color Doppler and duplex ultrasound were performed of bilateral carotid and vertebral arteries in the neck. COMPARISON:  None. FINDINGS: Criteria: Quantification of carotid stenosis is based on velocity parameters that correlate the residual internal carotid diameter with NASCET-based stenosis levels, using the diameter of the distal internal carotid lumen as the denominator for stenosis measurement. The following velocity measurements were obtained: RIGHT ICA:  96 cm/sec CCA:  80 cm/sec SYSTOLIC ICA/CCA RATIO:  1.2 DIASTOLIC ICA/CCA RATIO:  0.9 ECA:  181 cm/sec LEFT ICA:  129 cm/sec CCA:  242 cm/sec SYSTOLIC ICA/CCA RATIO:  1.0 DIASTOLIC ICA/CCA RATIO:  1.1 ECA:  126 cm/sec RIGHT CAROTID ARTERY: Minimal echogenic plaque or intimal thickening at the right carotid bulb without stenosis. Echogenic plaque at the origin of the external carotid artery. Small amount of echogenic plaque in the proximal internal carotid artery. Normal waveforms and velocities in the right internal carotid artery. RIGHT VERTEBRAL ARTERY: Antegrade flow and normal waveform in the right vertebral artery. LEFT CAROTID ARTERY: There is a mobile heterogeneous flap or thrombus in the proximal left common carotid artery. Small amount of echogenic plaque at the left carotid bulb. Small amount of echogenic plaque in the left internal carotid artery with normal waveforms. Minimal elevation of the left internal carotid artery peak systolic velocity, measuring 129 cm/sec. Left carotid artery ratios are within normal limits. LEFT VERTEBRAL ARTERY: Antegrade flow and normal waveform in the left vertebral artery. 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 No significant  stenosis associated with this lesion but there is risk for thromboembolic disease and difficult to exclude a focal dissection in this region. Consider further characterization with a CTA of the neck. Mild echogenic plaque in the carotid arteries bilaterally. Estimated degree of stenosis in the internal carotid arteries is less than 50% bilaterally. Patent vertebral arteries. These results were called by telephone at the time of interpretation on 03/30/2015 at 4:51 pm to Dr. Abel Presto , who verbally acknowledged these results. Electronically Signed   By: Markus Daft M.D.   On: 03/30/2015 16:53   US Abdomen Limited Ruq  03/30/2015  CLINICAL DATA:  Elevated liver enzymes EXAM: US ABDOMEN LIMITED - RIGHT UPPER QUADRANT COMPARISON:  None. FINDINGS: Gallbladder: Gallbladder contains sludge with small scattered intermingled gallstones. Largest gallstone measures 4 mm. The gallbladder wall is mildly thickened and edematous. No sonographic Murphy sign noted. Common bile duct: Diameter: 3 mm. There is no intrahepatic or extrahepatic biliary duct dilatation. Liver: No focal lesion identified.  Liver echogenicity is increased. IMPRESSION: Sludge and gallstones within gallbladder. Gallbladder wall thickened and edematous. Suspect a degree of acute cholecystitis. Increased liver echogenicity is most likely due to hepatic steatosis. While no focal liver lesions are identified, it must be cautioned that the sensitivity of ultrasound for focal liver lesions is diminished in this circumstance. Electronically Signed   By: Lowella Grip III M.D.   On: 03/30/2015 09:56    Review of Systems  Constitutional: Negative for fever, chills and weight loss.  HENT: Negative for hearing loss.   Eyes: Negative for blurred vision and double vision.  Respiratory: Negative for cough.   Cardiovascular: Negative for chest pain, palpitations  and orthopnea.  Gastrointestinal: Negative for heartburn, nausea, vomiting, abdominal pain  and diarrhea.  Skin: Negative.   Psychiatric/Behavioral: Negative for depression.    Physical Exam  Constitutional: He is oriented to person, place, and time and well-developed, well-nourished, and in no distress. No distress.  HENT:  Head: Normocephalic and atraumatic.  Eyes: Pupils are equal, round, and reactive to light.  Cardiovascular: Normal rate and regular rhythm.   Pulmonary/Chest: Effort normal. No respiratory distress. He has no wheezes. He has no rales.  Abdominal: Soft. He exhibits no distension and no mass. There is no tenderness. There is no rebound and no guarding.  Neurological: He is oriented to person, place, and time.  Skin: Skin is warm and dry. He is not diaphoretic.  Psychiatric: Mood, memory, affect and judgment normal.    Data Reviewed I reviewed the patient's imaging from his admission as well as his laboratory values in the history obtained by the service  I have personally reviewed the patient's imaging, laboratory findings and medical records.    Assessment    The patient has cholelithiasis. I see no signs of acute cholecystitis. Acute cholecystitis is a clinical diagnosis which is supported by radiographic and laboratory data. Radiographic findings in and of themselves do not make the diagnosis.    He has hyperbilirubinemia and elevated liver function tests which I do not think are related to his Biliary  system. Plan    The patient does not require operative intervention at this time. And even if he did in light of his acute cerebrovascular issues percutaneous drainage or antibiotics alone would suffice. He can follow-up in our office in 3-4 weeks' time.    Discussed with patient and nurse in detail.    Sherri Rad MD, FACS 03/31/2015, 10:35 AM

## 2015-03-31 NOTE — Plan of Care (Signed)
Problem: Discharge/Transitional Outcomes Goal: Other Discharge Outcomes/Goals Plan of care progress to goal: Hemodynamically: VSS, NIH 0 Minimal assistance up to bathroom Diet: tolerating Npo for testing

## 2015-03-31 NOTE — Evaluation (Signed)
Physical Therapy Evaluation Patient Details Name: David Sloan MRN: [id number] DOB: Jul 08, 1940 Today's Date: 03/31/2015   History of Present Illness  Patient is a pleasant 74 y/o male that presents with reports of bilateral LE weakness and shaking/chills. Noted to have elevated LFTs and slightly elevated troponins in this admission. CT showed possible small acute infarct in pons and bilateral occipital infarcts. MRI negative for acute changes.   Clinical Impression  Patient initially presents with LE weakness to [facility], on this evaluation no weakness or loss of balance noted with SPC. Patient ambulates community distance with no balance deficits noted (Tinetti 25/28, modified DGI 11/12, 5x sit to stand 9 seconds). He states he is at his baseline regarding gait speed and balance. Symmetrical strength bilaterally. At this time patient appears to be at his baseline and has minimal deficits observed on this evaluation. No further PT needs identified at this time.     Follow Up Recommendations No PT follow up    Equipment Recommendations       Recommendations for Other Services       Precautions / Restrictions Precautions Precautions: None Restrictions Weight Bearing Restrictions: No      Mobility  Bed Mobility Overal bed mobility: Independent             General bed mobility comments: No cuing needed for bed mobility.   Transfers Overall transfer level: Modified independent Equipment used: Straight cane             General transfer comment: Patient completes 5x sit to stand in 9 seconds, no balance deficits noted.   Ambulation/Gait Ambulation/Gait assistance: Modified independent (Device/Increase time) Ambulation Distance (Feet): 200 Feet Assistive device: Straight cane Gait Pattern/deviations: WFL(Within Functional Limits)   Gait velocity interpretation: at or above normal speed for age/gender General Gait Details: No deficits noted in ambulation, appears to be at  his baseline.   Stairs            Wheelchair Mobility    Modified Rankin (Stroke Patients Only)       Balance Overall balance assessment: Modified Independent                                           Pertinent Vitals/Pain Pain Assessment: No/denies pain (Reports a small headache. )    Home Living Family/patient expects to be discharged to:: Private residence Living Arrangements: Spouse/significant other Available Help at Discharge: Family Type of Home: House Home Access: Stairs to enter   Secretary/administrator of Steps: 2 Home Layout: One level Home Equipment: Cane - single point      Prior Function Level of Independence: Independent with assistive device(s)         Comments: Patient has been using a SPC for mobility, reports no falls.      Hand Dominance        Extremity/Trunk Assessment   Upper Extremity Assessment: Overall WFL for tasks assessed           Lower Extremity Assessment: Overall WFL for tasks assessed         Communication   Communication: No difficulties  Cognition Arousal/Alertness: Awake/alert Behavior During Therapy: WFL for tasks assessed/performed Overall Cognitive Status: Within Functional Limits for tasks assessed                      General Comments General comments (skin integrity, edema,  etc.): Tinetti 25/28, 5x sit to stand 9 seconds.     Exercises        Assessment/Plan    PT Assessment Patent does not need any further PT services  PT Diagnosis     PT Problem List    PT Treatment Interventions     PT Goals (Current goals can be found in the Care Plan section) Acute Rehab PT Goals Patient Stated Goal: To return home safely.  PT Goal Formulation: With patient Time For Goal Achievement: 04/14/15 Potential to Achieve Goals: Good    Frequency     Barriers to discharge        Co-evaluation               End of Session Equipment Utilized During Treatment: Gait  belt Activity Tolerance: Patient tolerated treatment well Patient left: in chair;with call bell/phone within reach;with chair alarm set Nurse Communication: Mobility status         Time: [id number] PT Time Calculation (min) (ACUTE ONLY): 22 min   Charges:   PT Evaluation $Initial PT Evaluation Tier I: 1 Procedure     PT G Codes:       Kerin RansomPatrick A McNamara, PT, DPT    03/31/2015, 11:49 AM

## 2015-03-31 NOTE — Progress Notes (Addendum)
SUBJECTIVE: No chest pain or SOB.   BP 152/60 mmHg  Pulse 82  Temp(Src) 98.7 F (37.1 C) (Oral)  Resp 18  Ht 5\' 6"  (1.676 m)  Wt 203 lb (92.08 kg)  BMI 32.78 kg/m2  SpO2 97%  Intake/Output Summary (Last 24 hours) at 03/31/15 1037 Last data filed at 03/31/15 0347  Gross per 24 hour  Intake      0 ml  Output    350 ml  Net   -350 ml    PHYSICAL EXAM General: Well developed, well nourished, in no acute distress. Alert and oriented x 3.  Psych:  Good affect, responds appropriately Neck: No JVD. No masses noted.  Lungs: Clear bilaterally with no wheezes or rhonci noted.  Heart: RRR with no murmurs noted. Abdomen: Bowel sounds are present. Soft, non-tender.  Extremities: No lower extremity edema.   LABS: Basic Metabolic Panel:  Recent Labs  40/98/1110/14/16 0744 03/31/15 0419  NA 140 141  K 3.6 3.5  CL 106 107  CO2 26 27  GLUCOSE 152* 108*  BUN 16 17  CREATININE 1.23 0.95  CALCIUM 9.1 8.9   CBC:  Recent Labs  03/30/15 0744 03/31/15 0419  WBC 16.6* 13.0*  NEUTROABS 15.3*  --   HGB 12.5* 12.1*  HCT 36.7* 35.9*  MCV 90.4 90.4  PLT 161 145*   Cardiac Enzymes:  Recent Labs  03/30/15 1233 03/30/15 1757 03/30/15 2226  TROPONINI 0.41* 0.54* 0.54*    Current Meds: . amLODipine  5 mg Oral Daily  . cloNIDine  0.2 mg Oral Daily  . dipyridamole-aspirin  1 capsule Oral BID  . enoxaparin (LOVENOX) injection  40 mg Subcutaneous Q24H  . gabapentin  300 mg Oral q morning - 10a  . gabapentin  600 mg Oral QPM  . insulin detemir  30 Units Subcutaneous Q supper  . insulin detemir  35 Units Subcutaneous Q breakfast  . losartan  100 mg Oral Daily  . oxybutynin  5 mg Oral Daily  . pantoprazole  40 mg Oral BID  . pravastatin  40 mg Oral q1800  . sodium chloride  3 mL Intravenous Q12H  . tamsulosin  0.4 mg Oral Daily    Echo 03/30/15: Left ventricle: The cavity size was normal. Systolic function was normal. The estimated ejection fraction was in the range of  60% to 65%. Wall motion was normal; there were no regional wall motion abnormalities. Doppler parameters are consistent with abnormal left ventricular relaxation (grade 1 diastolic dysfunction). - Left atrium: The atrium was normal in size. - Right ventricle: Systolic function was normal. - Pulmonary arteries: Systolic pressure was within the normal range.  Impressions:  - Normal study. No source or TIA or CVA noted.  ASSESSMENT AND PLAN:  1. Acute CVA vs TIA: Pt presented to the ED on the morning of 10/14 with bilat LE wkns and shaking/chills. Head CT suggestive of possible small acute infarct in pons along with possible bilat occipital infarcts. Ss have resolved. MRI without acute changes.   2. Elevated Troponin/Abnl ECG: In setting of #1, pt noted to have mild, troponin elevation with flat trend. He denies any recent h/o chest pain or dyspnea. He has been exercising over the past wk w/o limitations - last walked on treadmill and used stationary bike on 10/12. Prior neg MV in 04/2013. Echo with normal LV function. He was seen 03/30/15 by Dr. Mariah MillingGollan. Stress myoview pending today.   3. Essential HTN: Stable.  Addendum: Stress test  has been cancelled. I suspect his troponin elevation is due to his acute GI syndrome with demand ischemia. He has had no chest pain or SOB. He is ambulating without issues. Since the on call cardiology staff is refusing to do his stress test, I would favor monitoring today on telemetry, discharging home in am if stable and planning outpatient stress myoview with f/u with Dr. Mariah Milling.    Maurie Olesen  10/15/201610:37 AM

## 2015-03-31 NOTE — Plan of Care (Signed)
Problem: Discharge/Transitional Outcomes Goal: Other Discharge Outcomes/Goals Outcome: Completed/Met Date Met:  03/31/15 Pt is alert and oriented x 4, c/o chronic back pain improved with prn medication. Pt is up in room to bathroom, bathed throughotu shift, cardiologist did not want to do mioview bc of elevated troponins, surgery consulted with patient and does not want to do surgery due to current health. Denies n/v and abdominal pain, liver enzymes elevated, bilirubin elevated, ammonia level within normal limits, on room air, pt is d/c to home, transport provided to sister. Pt educated to stop taking medications that will cause harm to liver such as tylenol, percocet; pt given rx for oxycodone and pt is to pick up rx for new cholesterol medication at [facility] and to discontinue usage of current drug. Pt is encouraged to f/u with pcp, cardiologist and surgery, numbers for physicians provided to patient for follow up. Ambualted with physical therapy and recommendatiosn are to d/c to home. Pt pushed to visitor entrance via nursing staff, unit number provided to patient in the event that family or patient has questions, no current family at bedside.

## 2015-04-02 ENCOUNTER — Other Ambulatory Visit: Payer: Self-pay

## 2015-04-02 ENCOUNTER — Telehealth: Payer: Self-pay

## 2015-04-02 DIAGNOSIS — R7989 Other specified abnormal findings of blood chemistry: Secondary | ICD-10-CM

## 2015-04-02 DIAGNOSIS — R778 Other specified abnormalities of plasma proteins: Secondary | ICD-10-CM

## 2015-04-02 DIAGNOSIS — R9431 Abnormal electrocardiogram [ECG] [EKG]: Secondary | ICD-10-CM

## 2015-04-02 NOTE — Telephone Encounter (Signed)
Spoke w/ pt's wife.  Asked her to have pt call back to set up lexiscan & f/u w/ Dr. Mariah MillingGollan.

## 2015-04-03 NOTE — Telephone Encounter (Signed)
Spoke w/ pt.  Advised him that Ward Givenshris Berge, NP and Dr. Mariah MillingGollan recommend he proceed w/ lexiscan and that I can sched this for him.  He states that he saw his PCP yesterday and was advised that he does not need myoview.  Advised him that Dr. Mariah MillingGollan recommends he proceed w/ testing, as he presented to the ED w/ chest pain and his risk factors. Pt again declines. Asked him to call back if he changes his mind.

## 2015-04-13 DIAGNOSIS — M419 Scoliosis, unspecified: Secondary | ICD-10-CM | POA: Insufficient documentation

## 2015-04-13 DIAGNOSIS — M797 Fibromyalgia: Secondary | ICD-10-CM | POA: Insufficient documentation

## 2015-04-23 ENCOUNTER — Emergency Department
Admission: EM | Admit: 2015-04-23 | Discharge: 2015-04-23 | Disposition: A | Discharge status: Home / Self Care | Payer: Medicare Other | Attending: Student | Admitting: Student

## 2015-04-23 ENCOUNTER — Encounter: Payer: Self-pay | Admitting: Emergency Medicine

## 2015-04-23 ENCOUNTER — Emergency Department: Payer: Medicare Other

## 2015-04-23 DIAGNOSIS — E1140 Type 2 diabetes mellitus with diabetic neuropathy, unspecified: Secondary | ICD-10-CM | POA: Insufficient documentation

## 2015-04-23 DIAGNOSIS — K8020 Calculus of gallbladder without cholecystitis without obstruction: Secondary | ICD-10-CM | POA: Insufficient documentation

## 2015-04-23 DIAGNOSIS — Z87891 Personal history of nicotine dependence: Secondary | ICD-10-CM | POA: Insufficient documentation

## 2015-04-23 DIAGNOSIS — Z794 Long term (current) use of insulin: Secondary | ICD-10-CM | POA: Insufficient documentation

## 2015-04-23 DIAGNOSIS — Z79899 Other long term (current) drug therapy: Secondary | ICD-10-CM | POA: Insufficient documentation

## 2015-04-23 DIAGNOSIS — R1013 Epigastric pain: Secondary | ICD-10-CM

## 2015-04-23 DIAGNOSIS — E1159 Type 2 diabetes mellitus with other circulatory complications: Secondary | ICD-10-CM | POA: Diagnosis not present

## 2015-04-23 DIAGNOSIS — R109 Unspecified abdominal pain: Secondary | ICD-10-CM | POA: Diagnosis present

## 2015-04-23 DIAGNOSIS — I10 Essential (primary) hypertension: Secondary | ICD-10-CM | POA: Insufficient documentation

## 2015-04-23 DIAGNOSIS — R52 Pain, unspecified: Secondary | ICD-10-CM

## 2015-04-23 HISTORY — DX: Gastro-esophageal reflux disease without esophagitis: K21.9

## 2015-04-23 LAB — URINALYSIS COMPLETE WITH MICROSCOPIC (ARMC ONLY)
BILIRUBIN URINE: NEGATIVE
Bacteria, UA: NONE SEEN
GLUCOSE, UA: NEGATIVE mg/dL
HGB URINE DIPSTICK: NEGATIVE
KETONES UR: NEGATIVE mg/dL
LEUKOCYTES UA: NEGATIVE
NITRITE: NEGATIVE
Protein, ur: NEGATIVE mg/dL
SPECIFIC GRAVITY, URINE: 1.021 (ref 1.005–1.030)
WBC, UA: NONE SEEN WBC/hpf (ref 0–5)
pH: 5 (ref 5.0–8.0)

## 2015-04-23 LAB — CBC WITH DIFFERENTIAL/PLATELET
BASOS ABS: 0 10*3/uL (ref 0–0.1)
BASOS PCT: 0 %
EOS ABS: 0.1 10*3/uL (ref 0–0.7)
Eosinophils Relative: 1 %
HCT: 36.9 % — ABNORMAL LOW (ref 40.0–52.0)
HEMOGLOBIN: 12.7 g/dL — AB (ref 13.0–18.0)
Lymphocytes Relative: 14 %
Lymphs Abs: 1.3 10*3/uL (ref 1.0–3.6)
MCH: 30.9 pg (ref 26.0–34.0)
MCHC: 34.5 g/dL (ref 32.0–36.0)
MCV: 89.5 fL (ref 80.0–100.0)
MONOS PCT: 7 %
Monocytes Absolute: 0.6 10*3/uL (ref 0.2–1.0)
NEUTROS PCT: 78 %
Neutro Abs: 6.8 10*3/uL — ABNORMAL HIGH (ref 1.4–6.5)
PLATELETS: 223 10*3/uL (ref 150–440)
RBC: 4.12 MIL/uL — ABNORMAL LOW (ref 4.40–5.90)
RDW: 13 % (ref 11.5–14.5)
WBC: 8.8 10*3/uL (ref 3.8–10.6)

## 2015-04-23 LAB — COMPREHENSIVE METABOLIC PANEL
ALBUMIN: 3.7 g/dL (ref 3.5–5.0)
ALT: 17 U/L (ref 17–63)
ANION GAP: 4 — AB (ref 5–15)
AST: 19 U/L (ref 15–41)
Alkaline Phosphatase: 72 U/L (ref 38–126)
BILIRUBIN TOTAL: 0.7 mg/dL (ref 0.3–1.2)
BUN: 14 mg/dL (ref 6–20)
CHLORIDE: 104 mmol/L (ref 101–111)
CO2: 29 mmol/L (ref 22–32)
Calcium: 9.5 mg/dL (ref 8.9–10.3)
Creatinine, Ser: 0.95 mg/dL (ref 0.61–1.24)
GFR calc Af Amer: >60 mL/min (ref >60)
Glucose, Bld: 150 mg/dL — ABNORMAL HIGH (ref 65–99)
POTASSIUM: 4.1 mmol/L (ref 3.5–5.1)
Sodium: 137 mmol/L (ref 135–145)
TOTAL PROTEIN: 6.9 g/dL (ref 6.5–8.1)

## 2015-04-23 LAB — LIPASE, BLOOD: LIPASE: 23 U/L (ref 11–51)

## 2015-04-23 LAB — TROPONIN I: TROPONIN I: 0.03 ng/mL (ref <0.031)

## 2015-04-23 MED ORDER — GI COCKTAIL ~~LOC~~
30.0000 mL | Freq: Once | ORAL | Status: AC
  Administered 2015-04-23: 30 mL via ORAL
  Filled 2015-04-23: qty 30

## 2015-04-23 MED ORDER — ONDANSETRON 4 MG PO TBDP: 4.0000 mg | ORAL_TABLET | Freq: Three times a day (TID) | ORAL | Status: DC | PRN

## 2015-04-23 MED ORDER — OXYCODONE HCL 5 MG PO TABS: 5.0000 mg | ORAL_TABLET | Freq: Four times a day (QID) | ORAL | Status: DC | PRN

## 2015-04-23 MED ORDER — DICYCLOMINE HCL 10 MG/ML IM SOLN
20.0000 mg | Freq: Once | INTRAMUSCULAR | Status: AC
  Administered 2015-04-23: 20 mg via INTRAMUSCULAR
  Filled 2015-04-23: qty 2

## 2015-04-23 NOTE — ED Provider Notes (Signed)
[facility] Emergency Department Provider Note  ____________________________________________  Time seen: Approximately 12:54 PM  I have reviewed the triage vital signs and the nursing notes.   HISTORY  Chief Complaint Abdominal Pain    HPI David Sloan is a 74 y.o. male with hypertension, type 2 diabetes without complication, history of previous CVA, diabetic neuropathy, BPH, GERD, urinary incontinence who presents for evaluation of sudden onset epigastric cramping discomfort at 3 AM this morning, constant since onset, currently moderate in severity. No nausea, vomiting or diarrhea. Patient feels as if he is unable to burp or pass gas. He did have a bowel movement this morning. No chest pain or difficulty breathing. No modifying factors. He reports he has a history of GERD and take his pantoprazole with no resolution. Of note, he was discharged from [facility] approximately 3 weeks ago after treatment for CVA, troponemia, transaminitis with ultrasound that was concerning for acute cholecystitis however after surgical consult, it was decided that no operation was necessary. He reports that he has been feeling well since discharge.   Past Medical History  Diagnosis Date  . Essential hypertension   . Diabetes mellitus without complication (HCC)   . Stroke [facility])     a. ~ 2002 - no residual deficits;  b. 03/2015 Head CT: prior old infarcts and foci of small vesel dzs in pons bilat. ? bilat occipital lobe acute infarcts R>L.  . Diabetic neuropathy (HCC)   . BPH (benign prostatic hyperplasia)   . Urinary incontinence   . Atypical chest pain     a. 04/2013 MV: nl EF, no ischemia.  . Osteoarthritis     a. s/p bilat knee surgeries.  . OSA (obstructive sleep apnea)   . Chronic back pain   . Sinus bradycardia   . Falls   . Memory disorder   . GERD (gastroesophageal reflux disease)     Patient Active Problem List   Diagnosis Date Noted  .  Slurred speech 03/31/2015  . Elevated transaminase level 03/31/2015  . Gallstones 03/31/2015  . Lower extremity weakness 03/30/2015  . Cerebral infarction due to unspecified mechanism   . Elevated troponin   . Uncontrolled type 2 diabetes mellitus with circulatory disorder (HCC)   . Hyperlipidemia   . EKG abnormalities   . Emesis   . Gall bladder disease     Past Surgical History  Procedure Laterality Date  . Joint replacement      bilateral knee replacements    Current Outpatient Rx  Name  Route  Sig  Dispense  Refill  . amLODipine (NORVASC) 5 MG tablet   Oral   Take 5 mg by mouth daily.         Marland Kitchen atorvastatin (LIPITOR) 20 MG tablet   Oral   Take 1 tablet (20 mg total) by mouth daily.   30 tablet   4   . cloNIDine (CATAPRES) 0.1 MG tablet   Oral   Take 0.2 mg by mouth daily.         Marland Kitchen dipyridamole-aspirin (AGGRENOX) 200-25 MG 12hr capsule   Oral   Take 1 capsule by mouth 2 (two) times daily.         Marland Kitchen gabapentin (NEURONTIN) 300 MG capsule   Oral   Take 1-2 capsules by mouth 2 (two) times daily. Take 1 capsule in the morning and 2 capsule in the evening.         . insulin NPH Human (HUMULIN N) 100 UNIT/ML  injection   Subcutaneous   Inject 30-35 Units into the skin 2 (two) times daily. 35 units before breakfast and 30 units before supper.         . losartan (COZAAR) 100 MG tablet   Oral   Take 100 mg by mouth daily.         Marland Kitchen oxybutynin (DITROPAN XL) 5 MG 24 hr tablet   Oral   Take 5 mg by mouth daily.         Marland Kitchen oxycodone (OXY-IR) 5 MG capsule   Oral   Take 1 capsule (5 mg total) by mouth every 4 (four) hours as needed.   40 capsule   0   . pantoprazole (PROTONIX) 40 MG tablet   Oral   Take 40 mg by mouth 2 (two) times daily.         . tamsulosin (FLOMAX) 0.4 MG CAPS capsule   Oral   Take 0.4 mg by mouth daily.           Allergies Review of patient's allergies indicates no known allergies.  Family History  Problem Relation  Age of Onset  . Alzheimer's disease Mother   . Diabetes Father     Social History Social History  Substance Use Topics  . Smoking status: Former Smoker -- 1.00 packs/day for 25 years    Types: Cigarettes  . Smokeless tobacco: None  . Alcohol Use: No    Review of Systems Constitutional: No fever/chills Eyes: No visual changes. ENT: No sore throat. Cardiovascular: Denies chest pain. Respiratory: Denies shortness of breath. Gastrointestinal: + abdominal pain.  No nausea, no vomiting.  No diarrhea.  No constipation. Genitourinary: Negative for dysuria. Musculoskeletal: Negative for back pain. Skin: Negative for rash. Neurological: Negative for headaches, focal weakness or numbness.  10-point ROS otherwise negative.  ____________________________________________   PHYSICAL EXAM:  VITAL SIGNS: ED Triage Vitals  Enc Vitals Group     BP 04/23/15 1135 171/60 mmHg     Pulse Rate 04/23/15 1135 50     Resp 04/23/15 1135 20     Temp 04/23/15 1135 98.1 F (36.7 C)     Temp Source 04/23/15 1135 Oral     SpO2 04/23/15 1135 99 %     Weight 04/23/15 1135 203 lb (92.08 kg)     Height 04/23/15 1135  (1.676 m)     Head Cir --      Peak Flow --      Pain Score 04/23/15 1137 9     Pain Loc --      Pain Edu? --      Excl. in GC? --     Constitutional: Alert and oriented. Well appearing and in no acute distress. Eyes: Conjunctivae are normal. PERRL. EOMI. Head: Atraumatic. Nose: No congestion/rhinnorhea. Mouth/Throat: Mucous membranes are moist.  Oropharynx non-erythematous. Neck: No stridor.   Cardiovascular: Normal rate, regular rhythm. Grossly normal heart sounds.  Good peripheral circulation. Respiratory: Normal respiratory effort.  No retractions. Lungs CTAB. Gastrointestinal: Abdomen soft, normal bowel sounds, faint epigastric tenderness, no rebound No CVA tenderness. Genitourinary: deferred Musculoskeletal: No lower extremity tenderness nor edema.  No joint  effusions. Neurologic:  Normal speech and language. No gross focal neurologic deficits are appreciated.  Skin:  Skin is warm, dry and intact. No rash noted. Psychiatric: Mood and affect are normal. Speech and behavior are normal.  ____________________________________________   LABS (all labs ordered are listed, but only abnormal results are displayed)  Labs Reviewed  CBC WITH  DIFFERENTIAL/PLATELET - Abnormal; Notable for the following:    RBC 4.12 (*)    Hemoglobin 12.7 (*)    HCT 36.9 (*)    Neutro Abs 6.8 (*)    All other components within normal limits  COMPREHENSIVE METABOLIC PANEL - Abnormal; Notable for the following:    Glucose, Bld 150 (*)    Anion gap 4 (*)    All other components within normal limits  URINALYSIS COMPLETEWITH MICROSCOPIC ([facility] ONLY) - Abnormal; Notable for the following:    Color, Urine YELLOW (*)    APPearance CLEAR (*)    Squamous Epithelial / LPF 0-5 (*)    All other components within normal limits  LIPASE, BLOOD  TROPONIN I   ____________________________________________  EKG  ED ECG REPORT I, Gayla DossGayle, Saanya Zieske A, the attending physician, personally viewed and interpreted this ECG.   Date: 04/23/2015  EKG Time: 11:31  Rate: 50  Rhythm: sinus bradycardia  Axis: normal  Intervals:none  ST&T Change: No acute ST elevation. T-wave inversion in lead 3, T-wave flattening in aVF.  ____________________________________________  RADIOLOGY  RUQ ultrasound IMPRESSION: Gallstones without evidence of cholecystitis.  Fatty infiltration of the liver.   3 view abdominal plain films  IMPRESSION: 1. No acute cardiopulmonary abnormality. 2. Normal bowel gas pattern, no free air. ____________________________________________   PROCEDURES  Procedure(s) performed: None  Critical Care performed: No  ____________________________________________   INITIAL IMPRESSION / ASSESSMENT AND PLAN / ED COURSE  Pertinent labs & imaging results that were  available during my care of the patient were reviewed by me and considered in my medical decision making (see chart for details).  David Sloan is a 74 y.o. male with hypertension, type 2 diabetes without complication, history of previous CVA, diabetic neuropathy, BPH, GERD, urinary incontinence who presents for evaluation of sudden onset epigastric cramping discomfort at 3 AM. On exam, he is generally well-appearing and in no acute distress. Vital signs stable, he is afebrile. He does have tenderness to palpation in the epigastrium but it is faint. EKG shows no evidence of acute ischemia, doubt ACS but will obtain one set of cardiac enzymes, abdominal pain labs, right upper quadrant ultrasound given recent admission with concerning gallbladder findings and 3 view abdomen plain films to evaluate for obstruction. We'll treat his pain. Reassess for disposition.  ----------------------------------------- 3:04 PM on 04/23/2015 -----------------------------------------  Patient continues to appear well. Sitting up in bed, he is eaten a full or consent which. Still complaining of mild discomfort in his abdomen. Labs reviewed. CBC notable for very mild anemia with hemoglobin 12.7 but otherwise unremarkable. Normal CMP and lipase. Urinalysis not consistent with infection. Troponin negative. Plainfilms negative for obstruction. Ultrasound shows cholelithiasis without cholecystitis. I discussed with him that the cause of his pain is most likely gallstones. We discussed return precautions, symptomatic treatment with oxycodone, Zofran and need for close outpatient surgical follow-up. He is comfortable with the discharge plan. ____________________________________________   FINAL CLINICAL IMPRESSION(S) / ED DIAGNOSES  Final diagnoses:  Epigastric pain  Pain  Calculus of gallbladder without cholecystitis without obstruction      Gayla DossEryka A Yaneli Keithley, MD 04/23/15 1506

## 2015-04-23 NOTE — ED Notes (Signed)
C/O epigastric pain.  Awoke patient from sleep at 0310.  Has history of GERD.  States cannot pass gas or burp.  Last BM this morning.

## 2015-04-25 ENCOUNTER — Encounter: Payer: Self-pay | Admitting: *Deleted

## 2015-04-25 ENCOUNTER — Other Ambulatory Visit: Payer: Self-pay | Admitting: *Deleted

## 2015-04-26 ENCOUNTER — Encounter: Payer: Self-pay | Admitting: Surgery

## 2015-04-26 ENCOUNTER — Ambulatory Visit (INDEPENDENT_AMBULATORY_CARE_PROVIDER_SITE_OTHER): Payer: Medicare Other | Admitting: Surgery

## 2015-04-26 VITALS — BP 149/80 | HR 49 | Temp 98.4°F | Ht 66.5 in | Wt 205.0 lb

## 2015-04-26 DIAGNOSIS — K8020 Calculus of gallbladder without cholecystitis without obstruction: Secondary | ICD-10-CM | POA: Diagnosis not present

## 2015-04-26 DIAGNOSIS — K2100 Gastro-esophageal reflux disease with esophagitis, without bleeding: Secondary | ICD-10-CM

## 2015-04-26 DIAGNOSIS — K222 Esophageal obstruction: Secondary | ICD-10-CM

## 2015-04-26 MED ORDER — DEXLANSOPRAZOLE 60 MG PO CPDR: 60.0000 mg | DELAYED_RELEASE_CAPSULE | Freq: Every day | ORAL | Status: DC

## 2015-04-26 NOTE — Patient Instructions (Signed)
Patient needs f/u with GI provider.  He is to call if they do not find any reason for his abdominal pain.

## 2015-04-26 NOTE — Progress Notes (Signed)
Subjective:     Patient ID: David Sloan, male   DOB: [birth year], 74 y.o.   MRN: [id number]  HPI  74 yr old with multiple medical issues including HTN, CVA x 2, GERD, Acid relux, Esophageal stenosis, DM type 2 and cholelithiasis.  He was recently seen in the hospital after having significant epigastric pain.  Patient states it was about 2 hours after eating, he cannot remember what.  He states it came on very suddenly and strong, stayed in the [place], did not travel any other location.  Patient states that his acid reflux has been worsening recently and he has been having dark tarry stools.  He states he has had pain like this in the past.  He also states he has had an EGD and colonoscopy in the past and thinks they were done at [facility].  He cannot remember when these were done. He denies any fever, chills, nausea, vomiting, diarrhea, or constipation.  Past Medical History  Diagnosis Date  . Essential hypertension   . Diabetes mellitus without complication ([place])   . Stroke [facility])     a. ~ 2002 - no residual deficits;  b. 03/2015 Head CT: prior old infarcts and foci of small vesel dzs in pons bilat. ? bilat occipital lobe acute infarcts R>L.  . Diabetic neuropathy ([place])   . BPH (benign prostatic hyperplasia)   . Urinary incontinence   . Atypical chest pain     a. 04/2013 MV: nl EF, no ischemia.  . Osteoarthritis     a. s/p bilat knee surgeries.  . OSA (obstructive sleep apnea)   . Chronic back pain   . Sinus bradycardia   . Falls   . Memory disorder   . GERD (gastroesophageal reflux disease)    Past Surgical History  Procedure Laterality Date  . Joint replacement      bilateral knee replacements   Family History  Problem Relation Age of Onset  . Alzheimer's disease Mother   . Diabetes Father    Social History   Social History  . Marital Status: Married    Spouse Name: N/A  . Number of Children: N/A  . Years of Education: N/A   Social History Main Topics  . Smoking status:  Former Smoker -- 1.00 packs/day for 25 years    Types: Cigarettes  . Smokeless tobacco: Never Used  . Alcohol Use: No  . Drug Use: No  . Sexual Activity: Not Asked   Other Topics Concern  . None   Social History Narrative    Current outpatient prescriptions:  .  amLODipine (NORVASC) 5 MG tablet, Take 5 mg by mouth daily., Disp: , Rfl:  .  aspirin EC 81 MG tablet, Take by mouth., Disp: , Rfl:  .  atorvastatin (LIPITOR) 20 MG tablet, Take 20 mg by mouth., Disp: , Rfl:  .  Blood Glucose Monitoring Suppl (RA BLOOD GLUCOSE MONITOR) DEVI, Please provide ACCU-CHEK KIT, Disp: , Rfl:  .  cloNIDine (CATAPRES) 0.1 MG tablet, Take 0.2 mg by mouth daily., Disp: , Rfl:  .  dipyridamole-aspirin (AGGRENOX) 200-25 MG 12hr capsule, Take 1 capsule by mouth 2 (two) times daily., Disp: , Rfl:  .  insulin NPH Human (HUMULIN N) 100 UNIT/ML injection, Inject 30-35 Units into the skin 2 (two) times daily. 35 units before breakfast and 30 units before supper., Disp: , Rfl:  .  losartan (COZAAR) 100 MG tablet, Take 100 mg by mouth daily., Disp: , Rfl:  .  lovastatin (MEVACOR) 40  MG tablet, Take 40 mg by mouth at bedtime., Disp: , Rfl:  .  ondansetron (ZOFRAN ODT) 4 MG disintegrating tablet, Take 1 tablet (4 mg total) by mouth every 8 (eight) hours as needed for nausea or vomiting., Disp: 12 tablet, Rfl: 0 .  oxybutynin (DITROPAN XL) 5 MG 24 hr tablet, Take 5 mg by mouth daily., Disp: , Rfl:  .  oxyCODONE-acetaminophen (PERCOCET) 7.5-325 MG tablet, Take by mouth., Disp: , Rfl:  .  pantoprazole (PROTONIX) 40 MG tablet, Take 40 mg by mouth 2 (two) times daily., Disp: , Rfl:  .  tamsulosin (FLOMAX) 0.4 MG CAPS capsule, Take 0.4 mg by mouth daily., Disp: , Rfl:  .  dexlansoprazole (DEXILANT) 60 MG capsule, Take 1 capsule (60 mg total) by mouth daily., Disp: 30 capsule, Rfl: 3 No Known Allergies     Review of Systems  Constitutional: Negative for fever, chills, activity change, appetite change, fatigue and  unexpected weight change.  HENT: Negative for congestion and sore throat.   Respiratory: Negative for cough, shortness of breath and wheezing.   Cardiovascular: Negative for chest pain, palpitations and leg swelling.  Gastrointestinal: Positive for abdominal pain, blood in stool and abdominal distention. Negative for nausea, vomiting, diarrhea, constipation and rectal pain.  Genitourinary: Negative for dysuria, frequency and hematuria.  Musculoskeletal: Positive for back pain and gait problem.  Skin: Positive for pallor. Negative for color change, rash and wound.  Hematological: Negative for adenopathy. Does not bruise/bleed easily.  Psychiatric/Behavioral: Negative for confusion and agitation.  All other systems reviewed and are negative.      Filed Vitals:   04/26/15 1144  BP: 149/80  Pulse: 49  Temp: 98.4 F (36.9 C)    Objective:   Physical Exam  Constitutional: He is oriented to person, place, and time. He appears well-developed and well-nourished. No distress.  HENT:  Head: Normocephalic and atraumatic.  Right Ear: External ear normal.  Left Ear: External ear normal.  Nose: Nose normal.  Mouth/Throat: Oropharynx is clear and moist. No oropharyngeal exudate.  Eyes: Conjunctivae and EOM are normal. Pupils are equal, round, and reactive to light. No scleral icterus.  Neck: Normal range of motion. Neck supple. No tracheal deviation present.  Cardiovascular: Normal rate, regular rhythm, normal heart sounds and intact distal pulses.   No murmur heard. Pulmonary/Chest: Effort normal and breath sounds normal. No respiratory distress. He has no wheezes.  Abdominal: Soft. Bowel sounds are normal. He exhibits no distension and no mass. There is no tenderness. There is no rebound.  Musculoskeletal: Normal range of motion. He exhibits no edema or tenderness.  Neurological: He is alert and oriented to person, place, and time.  Skin: Skin is dry. No rash noted. No erythema. No pallor.   Psychiatric: He has a normal mood and affect. His behavior is normal. Judgment and thought content normal.  Vitals reviewed.      CBC Latest Ref Rng 04/23/2015 03/31/2015 03/31/2015  WBC 3.8 - 10.6 K/uL 8.8 13.0(H) 13.0(H)  Hemoglobin 13.0 - 18.0 g/dL 12.7(L) 11.9(L) 12.1(L)  Hematocrit 40.0 - 52.0 % 36.9(L) 35.3(L) 35.9(L)  Platelets 150 - 440 K/uL 223 137(L) 145(L)    CMP Latest Ref Rng 04/23/2015 03/31/2015 03/31/2015  Glucose 65 - 99 mg/dL 150(H) - 108(H)  BUN 6 - 20 mg/dL 14 - 17  Creatinine 0.61 - 1.24 mg/dL 0.95 - 0.95  Sodium 135 - 145 mmol/L 137 - 141  Potassium 3.5 - 5.1 mmol/L 4.1 - 3.5  Chloride 101 - 111 mmol/L  104 - 107  CO2 22 - 32 mmol/L 29 - 27  Calcium 8.9 - 10.3 mg/dL 9.5 - 8.9  Total Protein 6.5 - 8.1 g/dL 6.9 6.0(L) 6.1(L)  Total Bilirubin 0.3 - 1.2 mg/dL 0.7 3.7(H) 4.0(H)  Alkaline Phos 38 - 126 U/L 72 84 84  AST 15 - 41 U/L 19 99(H) 125(H)  ALT 17 - 63 U/L 17 172(H) 194(H)   U/s: IMPRESSION: Gallstones without evidence of cholecystitis. Fatty infiltration of the liver.  Assessment:     74 yr old with epigastric pain and cholelithasis     Plan:     I discussed with the patient that given the complex of symptoms, epigastric pain, worsening acid reflux and some melena that I was concerned he had gastritis or PUD. I personally reviewed his records, recent NSTEMI concern and multiple medical issues.  I also reviewed his laboratory values and I personally reviewed the U/S images and radiology read showing cholelithiasis without cholecystitis with some elevation in liver enzymes in the past.  I changed his prilosec to dexilant and will refer him to a gastroenterlogist.  If they do not feel his symptoms are from the stomach or other enteric pathology will be happy to evaluate him again.

## 2015-05-02 ENCOUNTER — Telehealth: Payer: Self-pay | Admitting: Surgery

## 2015-05-02 NOTE — Telephone Encounter (Signed)
[facility] @ [phone] for Epigastric pain/GERD per Dr Orvis BrillLoflin.  All notes, labs and demographics have been sent. [facility] will call patient to set up an appointment once records are reviewed.

## 2015-05-28 ENCOUNTER — Emergency Department: Payer: Medicare Other

## 2015-05-28 ENCOUNTER — Emergency Department
Admission: EM | Admit: 2015-05-28 | Discharge: 2015-05-28 | Disposition: A | Discharge status: Home / Self Care | Payer: Medicare Other | Attending: Emergency Medicine | Admitting: Emergency Medicine

## 2015-05-28 DIAGNOSIS — R509 Fever, unspecified: Secondary | ICD-10-CM

## 2015-05-28 DIAGNOSIS — M549 Dorsalgia, unspecified: Secondary | ICD-10-CM | POA: Diagnosis not present

## 2015-05-28 DIAGNOSIS — I10 Essential (primary) hypertension: Secondary | ICD-10-CM | POA: Insufficient documentation

## 2015-05-28 DIAGNOSIS — R1110 Vomiting, unspecified: Secondary | ICD-10-CM | POA: Insufficient documentation

## 2015-05-28 DIAGNOSIS — Z794 Long term (current) use of insulin: Secondary | ICD-10-CM | POA: Diagnosis not present

## 2015-05-28 DIAGNOSIS — Z87891 Personal history of nicotine dependence: Secondary | ICD-10-CM | POA: Insufficient documentation

## 2015-05-28 DIAGNOSIS — J069 Acute upper respiratory infection, unspecified: Secondary | ICD-10-CM | POA: Diagnosis not present

## 2015-05-28 DIAGNOSIS — Z7982 Long term (current) use of aspirin: Secondary | ICD-10-CM | POA: Diagnosis not present

## 2015-05-28 DIAGNOSIS — E1159 Type 2 diabetes mellitus with other circulatory complications: Secondary | ICD-10-CM | POA: Insufficient documentation

## 2015-05-28 DIAGNOSIS — Z79899 Other long term (current) drug therapy: Secondary | ICD-10-CM | POA: Insufficient documentation

## 2015-05-28 DIAGNOSIS — R531 Weakness: Secondary | ICD-10-CM | POA: Diagnosis not present

## 2015-05-28 DIAGNOSIS — R197 Diarrhea, unspecified: Secondary | ICD-10-CM | POA: Diagnosis not present

## 2015-05-28 DIAGNOSIS — E1136 Type 2 diabetes mellitus with diabetic cataract: Secondary | ICD-10-CM | POA: Insufficient documentation

## 2015-05-28 DIAGNOSIS — R000 Tachycardia, unspecified: Secondary | ICD-10-CM | POA: Diagnosis not present

## 2015-05-28 LAB — CBC
HCT: 37.8 % — ABNORMAL LOW (ref 40.0–52.0)
HEMOGLOBIN: 12.6 g/dL — AB (ref 13.0–18.0)
MCH: 30 pg (ref 26.0–34.0)
MCHC: 33.4 g/dL (ref 32.0–36.0)
MCV: 90 fL (ref 80.0–100.0)
Platelets: 213 10*3/uL (ref 150–440)
RBC: 4.19 MIL/uL — ABNORMAL LOW (ref 4.40–5.90)
RDW: 13 % (ref 11.5–14.5)
WBC: 18.1 10*3/uL — ABNORMAL HIGH (ref 3.8–10.6)

## 2015-05-28 LAB — URINALYSIS COMPLETE WITH MICROSCOPIC (ARMC ONLY)
Bacteria, UA: NONE SEEN
Bilirubin Urine: NEGATIVE
Glucose, UA: NEGATIVE mg/dL
HGB URINE DIPSTICK: NEGATIVE
KETONES UR: NEGATIVE mg/dL
LEUKOCYTES UA: NEGATIVE
NITRITE: NEGATIVE
PROTEIN: NEGATIVE mg/dL
RBC / HPF: NONE SEEN RBC/hpf (ref 0–5)
SPECIFIC GRAVITY, URINE: 1.013 (ref 1.005–1.030)
Squamous Epithelial / LPF: NONE SEEN
pH: 5 (ref 5.0–8.0)

## 2015-05-28 LAB — COMPREHENSIVE METABOLIC PANEL
ALBUMIN: 3.5 g/dL (ref 3.5–5.0)
ALK PHOS: 96 U/L (ref 38–126)
ALT: 132 U/L — AB (ref 17–63)
AST: 198 U/L — AB (ref 15–41)
Anion gap: 5 (ref 5–15)
BUN: 19 mg/dL (ref 6–20)
CALCIUM: 9.5 mg/dL (ref 8.9–10.3)
CHLORIDE: 109 mmol/L (ref 101–111)
CO2: 27 mmol/L (ref 22–32)
CREATININE: 1.03 mg/dL (ref 0.61–1.24)
GFR calc non Af Amer: >60 mL/min (ref >60)
GLUCOSE: 147 mg/dL — AB (ref 65–99)
Potassium: 4 mmol/L (ref 3.5–5.1)
SODIUM: 141 mmol/L (ref 135–145)
Total Bilirubin: 1.7 mg/dL — ABNORMAL HIGH (ref 0.3–1.2)
Total Protein: 6.7 g/dL (ref 6.5–8.1)

## 2015-05-28 LAB — TROPONIN I: Troponin I: <0.03 ng/mL (ref <0.031)

## 2015-05-28 MED ORDER — ACETAMINOPHEN 325 MG PO TABS
650.0000 mg | ORAL_TABLET | Freq: Once | ORAL | Status: AC
  Administered 2015-05-28: 650 mg via ORAL
  Filled 2015-05-28: qty 2

## 2015-05-28 MED ORDER — LEVOFLOXACIN 750 MG PO TABS: 750.0000 mg | ORAL_TABLET | Freq: Every day | ORAL | Status: AC

## 2015-05-28 MED ORDER — SODIUM CHLORIDE 0.9 % IV BOLUS (SEPSIS)
500.0000 mL | Freq: Once | INTRAVENOUS | Status: AC
  Administered 2015-05-28: 500 mL via INTRAVENOUS

## 2015-05-28 MED ORDER — IBUPROFEN 600 MG PO TABS
600.0000 mg | ORAL_TABLET | Freq: Once | ORAL | Status: AC
Start: 2015-05-28 — End: 2015-05-28
  Administered 2015-05-28: 600 mg via ORAL
  Filled 2015-05-28: qty 1

## 2015-05-28 NOTE — ED Notes (Signed)
Only able to obtain one set of blood cultures after multiple sticks. Dr Zenda AlpersWebster notified. Order to hold second set of blood cultures.

## 2015-05-28 NOTE — ED Notes (Addendum)
Pt to triage via w/c, appears uncomfortable; reports lower back pain, weakness, N/V/D; st sched for surgery tomorrow for kidney stone; pt also c/o prod cough white sputum for 2-3wks

## 2015-05-28 NOTE — Discharge Instructions (Signed)
As we discussed please take your antibiotics as prescribed for their entire course. Please return to the emergency department in 1-2 days if not feeling better, or sooner if your symptoms worsen. Please follow-up with your primary care doctor in 2-3 days for recheck. Return to the emergency department for any acutely concerning symptoms, especially if you develop abdominal or chest pain.    Fever, Adult A fever is an increase in the body's temperature. It is usually defined as a temperature of 100F (38C) or higher. Brief mild or moderate fevers generally have no long-term effects, and they often do not require treatment. Moderate or high fevers may make you feel uncomfortable and can sometimes be a sign of a serious illness or disease. The sweating that may occur with repeated or prolonged fever may also cause dehydration. Fever is confirmed by taking a temperature with a thermometer. A measured temperature can vary with:  Age.  Time of day.  Location of the thermometer:  Mouth (oral).  Rectum (rectal).  Ear (tympanic).  Underarm (axillary).  Forehead (temporal). HOME CARE INSTRUCTIONS Pay attention to any changes in your symptoms. Take these actions to help with your condition:  Take over-the counter and prescription medicines only as told by your health care provider. Follow the dosing instructions carefully.  If you were prescribed an antibiotic medicine, take it as told by your health care provider. Do not stop taking the antibiotic even if you start to feel better.  Rest as needed.  Drink enough fluid to keep your urine clear or pale yellow. This helps to prevent dehydration.  Sponge yourself or bathe with room-temperature water to help reduce your body temperature as needed. Do not use ice water.  Do not overbundle yourself in blankets or heavy clothes. SEEK MEDICAL CARE IF:  You vomit.  You cannot eat or drink without vomiting.  You have diarrhea.  You have pain  when you urinate.  Your symptoms do not improve with treatment.  You develop new symptoms.  You develop excessive weakness. SEEK IMMEDIATE MEDICAL CARE IF:  You have shortness of breath or have trouble breathing.  You are dizzy or you faint.  You are disoriented or confused.  You develop signs of dehydration, such as a dry mouth, decreased urination, or paleness.  You develop severe pain in your abdomen.  You have persistent vomiting or diarrhea.  You develop a skin rash.  Your symptoms suddenly get worse.   This information is not intended to replace advice given to you by your health care provider. Make sure you discuss any questions you have with your health care provider.   Document Released: 11/26/2000 Document Revised: 02/21/2015 Document Reviewed: 07/27/2014 Elsevier Interactive Patient Education 2016 Else[employer] Inc.  Upper Respiratory Infection, Adult Most upper respiratory infections (URIs) are a viral infection of the air passages leading to the lungs. A URI affects the nose, throat, and upper air passages. The most common type of URI is nasopharyngitis and is typically referred to as "the common cold." URIs run their course and usually go away on their own. Most of the time, a URI does not require medical attention, but sometimes a bacterial infection in the upper airways can follow a viral infection. This is called a secondary infection. Sinus and middle ear infections are common types of secondary upper respiratory infections. Bacterial pneumonia can also complicate a URI. A URI can worsen asthma and chronic obstructive pulmonary disease (COPD). Sometimes, these complications can require emergency medical care and may be  life threatening.  CAUSES Almost all URIs are caused by viruses. A virus is a type of germ and can spread from one person to another.  RISKS FACTORS You may be at risk for a URI if:   You smoke.   You have chronic heart or lung disease.  You  have a weakened defense (immune) system.   You are very young or very old.   You have nasal allergies or asthma.  You work in crowded or poorly ventilated areas.  You work in health care facilities or schools. SIGNS AND SYMPTOMS  Symptoms typically develop 2-3 days after you come in contact with a cold virus. Most viral URIs last 7-10 days. However, viral URIs from the influenza virus (flu virus) can last 14-18 days and are typically more severe. Symptoms may include:   Runny or stuffy (congested) nose.   Sneezing.   Cough.   Sore throat.   Headache.   Fatigue.   Fever.   Loss of appetite.   Pain in your forehead, behind your eyes, and over your cheekbones (sinus pain).  Muscle aches.  DIAGNOSIS  Your health care provider may diagnose a URI by:  Physical exam.  Tests to check that your symptoms are not due to another condition such as:  Strep throat.  Sinusitis.  Pneumonia.  Asthma. TREATMENT  A URI goes away on its own with time. It cannot be cured with medicines, but medicines may be prescribed or recommended to relieve symptoms. Medicines may help:  Reduce your fever.  Reduce your cough.  Relieve nasal congestion. HOME CARE INSTRUCTIONS   Take medicines only as directed by your health care provider.   Gargle warm saltwater or take cough drops to comfort your throat as directed by your health care provider.  Use a warm mist humidifier or inhale steam from a shower to increase air moisture. This may make it easier to breathe.  Drink enough fluid to keep your urine clear or pale yellow.   Eat soups and other clear broths and maintain good nutrition.   Rest as needed.   Return to work when your temperature has returned to normal or as your health care provider advises. You may need to stay home longer to avoid infecting others. You can also use a face mask and careful hand washing to prevent spread of the virus.  Increase the usage of  your inhaler if you have asthma.   Do not use any tobacco products, including cigarettes, chewing tobacco, or electronic cigarettes. If you need help quitting, ask your health care provider. PREVENTION  The best way to protect yourself from getting a cold is to practice good hygiene.   Avoid oral or hand contact with people with cold symptoms.   Wash your hands often if contact occurs.  There is no clear evidence that vitamin C, vitamin E, echinacea, or exercise reduces the chance of developing a cold. However, it is always recommended to get plenty of rest, exercise, and practice good nutrition.  SEEK MEDICAL CARE IF:   You are getting worse rather than better.   Your symptoms are not controlled by medicine.   You have chills.  You have worsening shortness of breath.  You have brown or red mucus.  You have yellow or brown nasal discharge.  You have pain in your face, especially when you bend forward.  You have a fever.  You have swollen neck glands.  You have pain while swallowing.  You have white areas in  the back of your throat. SEEK IMMEDIATE MEDICAL CARE IF:   You have severe or persistent:  Headache.  Ear pain.  Sinus pain.  Chest pain.  You have chronic lung disease and any of the following:  Wheezing.  Prolonged cough.  Coughing up blood.  A change in your usual mucus.  You have a stiff neck.  You have changes in your:  Vision.  Hearing.  Thinking.  Mood. MAKE SURE YOU:   Understand these instructions.  Will watch your condition.  Will get help right away if you are not doing well or get worse.   This information is not intended to replace advice given to you by your health care provider. Make sure you discuss any questions you have with your health care provider.   Document Released: 11/26/2000 Document Revised: 10/17/2014 Document Reviewed: 09/07/2013 Elsevier Interactive Patient Education [employer]2016 Elsevier Inc.

## 2015-05-28 NOTE — ED Provider Notes (Signed)
-----------------------------------------   10:00 AM on 05/28/2015 -----------------------------------------  Ultrasound shows gallstones, with some mild thickening but no other signs of the cholecystitis. On my exam the patient has no abdominal tenderness palpation. Specifically absolutely no right upper quadrant tenderness. States he has known about the gallstones for years but has not pursued the surgery. In reviewing the patient's chart he has intermittently had elevated LFTs consistent with today's labs.  Patient has a cough with congestion, symptoms most suggestive of an upper respiratory infection. I discussed with the patient given his elevated white blood cell count, fever, tachycardia, admission to the hospital versus antibiotics and discharge home.. The patient much prefers to be discharged home at this time. I discussed with the patient tryingthe antibiotics  for the next 2 days, if not better the patient is to return to the emergency department for reevaluation, otherwise the patient will follow up with his primary care physician in 2-3 days for recheck. Currently the patient has a 100% oxygen saturation on room air, pulse rate of 78, respiratory rate around 15.  Minna AntisKevin Soni Kegel, MD 05/28/15 1003

## 2015-05-28 NOTE — ED Notes (Signed)
Patient transported to CT 

## 2015-05-28 NOTE — ED Notes (Signed)
Patient transported to Ultrasound 

## 2015-05-28 NOTE — ED Provider Notes (Signed)
[facility] Emergency Department Provider Note  ____________________________________________  Time seen: Approximately 325 AM  I have reviewed the triage vital signs and the nursing notes.   HISTORY  Chief Complaint Back Pain; Weakness; and Emesis    HPI David Sloan is a 74 y.o. male who comes into the hospital today with back pain and weakness. The patient reports that he passed a kidney stone 2 months ago and thinks he may be passing a kidney stone now. The patient reports that he was cold and shaking and he vomited which is why he thinks he is passing the stone. The patient has had similar symptoms with vomiting and diarrhea where he was told he had a gallstone and then was told he had a mild heart attack. The patient reports that he is supposed to receive a stress test tomorrow but the symptoms caused him to come in tonight. The patient is having pain in his back all the way across his back that started at 1 AM. He is unsure if it feels like a previous stone but again he's been vomiting and having diarrhea. The patient was here previously for gallstonesand a possible MI with these symptoms.     Past Medical History  Diagnosis Date  . Essential hypertension   . Diabetes mellitus without complication ([place])   . Stroke [facility])     a. ~ 2002 - no residual deficits;  b. 03/2015 Head CT: prior old infarcts and foci of small vesel dzs in pons bilat. ? bilat occipital lobe acute infarcts R>L.  . Diabetic neuropathy ([place])   . BPH (benign prostatic hyperplasia)   . Urinary incontinence   . Atypical chest pain     a. 04/2013 MV: nl EF, no ischemia.  . Osteoarthritis     a. s/p bilat knee surgeries.  . OSA (obstructive sleep apnea)   . Chronic back pain   . Sinus bradycardia   . Falls   . Memory disorder   . GERD (gastroesophageal reflux disease)     Patient Active Problem List   Diagnosis Date Noted  . Scoliosis 04/13/2015  . Fibrositis 04/13/2015  .  Slurred speech 03/31/2015  . Elevated transaminase level 03/31/2015  . Gallstones 03/31/2015  . Lower extremity weakness 03/30/2015  . Cerebral infarction due to unspecified mechanism   . Elevated troponin   . Uncontrolled type 2 diabetes mellitus with circulatory disorder ([place])   . Hyperlipidemia   . EKG abnormalities   . Emesis   . Gall bladder disease   . David long term (current) drug therapy 02/13/2015  . Essential (primary) hypertension 09/18/2014  . Neuralgia neuritis, sciatic nerve 09/15/2014  . Bradycardia 09/13/2014  . Cognitive disorder 04/05/2014  . Chronic pain associated with significant psychosocial dysfunction 01/17/2014  . Back ache 01/17/2014  . Esophageal stenosis 11/30/2013  . Can't get food down 11/30/2013  . Continuous opioid dependence ([place]) 07/06/2013  . Cerebrovascular accident (CVA) ([place]) 05/19/2013  . Adult hypothyroidism 05/19/2013  . Acid reflux 05/19/2013  . Lumbar and sacral osteoarthritis 01/11/2013  . Arthralgia, sacroiliac 10/06/2012  . Arthritis, degenerative 10/06/2012  . Obstructive apnea 10/06/2012  . Degeneration of intervertebral disc of lumbar region 10/06/2012  . Lumbar and sacral arthritis 10/06/2012  . Diabetes mellitus ([place]) 10/06/2012  . Punctate keratitis 01/21/2012  . 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[place]) 01/21/2012  . Type 2 diabetes mellitus ([place]) 10/16/2010  . Dry eye syndrome 10/16/2010  . Corneal changes, senile 10/16/2010  . Primary open angle glaucoma 10/16/2010  .  Glaucoma 10/16/2010  . Nonproliferative diabetic retinopathy ([place]) 10/16/2010    Past Surgical History  Procedure Laterality Date  . Joint replacement      bilateral knee replacements    Current Outpatient Rx  Name  Route  Sig  Dispense  Refill  . amLODipine (NORVASC) 5 MG tablet   Oral   Take 5 mg by mouth daily.         Marland Kitchen aspirin EC 81 MG tablet   Oral   Take 81 mg by mouth.          Marland Kitchen atorvastatin (LIPITOR) 20 MG tablet   Oral   Take 20 mg by  mouth.         . Blood Glucose Monitoring Suppl (RA BLOOD GLUCOSE MONITOR) DEVI      Please provide ACCU-CHEK KIT         . cloNIDine (CATAPRES) 0.1 MG tablet   Oral   Take 0.2 mg by mouth daily.         Marland Kitchen dexlansoprazole (DEXILANT) 60 MG capsule   Oral   Take 60 mg by mouth daily.         Marland Kitchen dipyridamole-aspirin (AGGRENOX) 200-25 MG 12hr capsule   Oral   Take 1 capsule by mouth 2 (two) times daily.         Marland Kitchen gabapentin (NEURONTIN) 300 MG capsule   Oral   Take 300 mg by mouth 2 (two) times daily. Take 1 capsule every morning and 2 capsules every night         . insulin NPH Human (HUMULIN N) 100 UNIT/ML injection   Subcutaneous   Inject 30-35 Units into the skin 2 (two) times daily. 35 units before breakfast and 30 units before supper.         . losartan (COZAAR) 100 MG tablet   Oral   Take 100 mg by mouth daily.         Marland Kitchen lovastatin (MEVACOR) 40 MG tablet   Oral   Take 40 mg by mouth at bedtime.         Marland Kitchen oxybutynin (DITROPAN XL) 5 MG 24 hr tablet   Oral   Take 5 mg by mouth daily.         Marland Kitchen oxyCODONE-acetaminophen (PERCOCET) 7.5-325 MG tablet   Oral   Take 1 tablet by mouth every 6 (six) hours as needed for moderate pain.          . pantoprazole (PROTONIX) 40 MG tablet   Oral   Take 40 mg by mouth 2 (two) times daily.         . tamsulosin (FLOMAX) 0.4 MG CAPS capsule   Oral   Take 0.4 mg by mouth daily.           Allergies Review of patient's allergies indicates no known allergies.  Family History  Problem Relation Age of Onset  . Alzheimer's disease Mother   . Diabetes Father     Social History Social History  Substance Use Topics  . Smoking status: Former Smoker -- 1.00 packs/day for 25 years    Types: Cigarettes  . Smokeless tobacco: Never Used  . Alcohol Use: No    Review of Systems Constitutional: No fever/chills Eyes: No visual changes. ENT: No sore throat. Cardiovascular: Denies chest pain. Respiratory:  cough Gastrointestinal: No abdominal pain.  No nausea, no vomiting.  No diarrhea.  No constipation. Genitourinary: Negative for dysuria. Musculoskeletal: back pain. Skin: Negative for rash. Neurological: Negative for headaches, focal  weakness or numbness.  10-point ROS otherwise negative.  ____________________________________________   PHYSICAL EXAM:  VITAL SIGNS: ED Triage Vitals  Enc Vitals Group     BP 05/28/15 0244 131/49 mmHg     Pulse Rate 05/28/15 0244 113     Resp 05/28/15 0244 27     Temp 05/28/15 0244 98.1 F (36.7 C)     Temp Source 05/28/15 0244 Oral     SpO2 05/28/15 0244 91 %     Weight 05/28/15 0244 203 lb (92.08 kg)     Height 05/28/15 0244 '5\' 6"'  (1.676 m)     Head Cir --      Peak Flow --      Pain Score 05/28/15 0238 9     Pain Loc --      Pain Edu? --      Excl. in [place]? --     Constitutional: Alert and oriented. Well appearing and in mild distress. Eyes: Conjunctivae are normal. PERRL. EOMI. Head: Atraumatic. Nose: No congestion/rhinnorhea. Mouth/Throat: Mucous membranes are moist.  Oropharynx non-erythematous. Cardiovascular: tachycardia, regular rhythm. Grossly normal heart sounds.  Good peripheral circulation. Respiratory: Normal respiratory effort.  No retractions. Lungs CTAB. Gastrointestinal: Soft and nontender. No distention. Positive bowel sounds Musculoskeletal: No lower extremity tenderness nor edema.   Neurologic:  Normal speech and language.  Skin:  Skin is warm, dry and intact. Psychiatric: Mood and affect are normal.   ____________________________________________   LABS (all labs ordered are listed, but only abnormal results are displayed)  Labs Reviewed  CBC - Abnormal; Notable for the following:    WBC 18.1 (*)    RBC 4.19 (*)    Hemoglobin 12.6 (*)    HCT 37.8 (*)    All David components within normal limits  CULTURE, BLOOD (ROUTINE X 2)  CULTURE, BLOOD (ROUTINE X 2)  COMPREHENSIVE METABOLIC PANEL  TROPONIN I   URINALYSIS COMPLETEWITH MICROSCOPIC ([facility] ONLY)   ____________________________________________  EKG  ED ECG REPORT I, Loney Hering, the attending physician, personally viewed and interpreted this ECG.   Date: 05/28/2015  EKG Time: 244  Rate: 114  Rhythm: sinus tachycardia  Axis: Left axis deviation  Intervals:none  ST&T Change: Flipped T waves in leads 1 and aVL  ____________________________________________  RADIOLOGY  Chest x-ray: No acute cardiopulmonary process ____________________________________________   PROCEDURES  Procedure(s) performed: None  Critical Care performed: No  ____________________________________________   INITIAL IMPRESSION / ASSESSMENT AND PLAN / ED COURSE  Pertinent labs & imaging results that were available during my care of the patient were reviewed by me and considered in my medical decision making (see chart for details).  This is a 74 year old male who comes in today with back pain nausea and vomiting. The patient has had similar symptoms but is febrile here in the emergency department. I well do some blood work as well as do a chest x-ray and check some urine. I will give the patient some Tylenol and 500 mL of normal saline and reassess him once he's received this medication.  The patient's urine and chest x-ray are unremarkable at this time. I will do a CT scan looking at the patient's back to evaluate for possible kidney stone or David cause of the patient's fever. The patient did receive some Tylenol and ibuprofen as well as a liter of normal saline. The patient's care will be signed out to Dr. Kerman Passey who will follow-up the results of the CT scan and reassess the patient. ____________________________________________   FINAL  CLINICAL IMPRESSION(S) / ED DIAGNOSES  Final diagnoses:  None      Loney Hering, MD 05/28/15 [phone]

## 2015-06-02 LAB — CULTURE, BLOOD (ROUTINE X 2): Culture: NO GROWTH

## 2015-07-17 DIAGNOSIS — E1140 Type 2 diabetes mellitus with diabetic neuropathy, unspecified: Secondary | ICD-10-CM | POA: Insufficient documentation

## 2015-07-30 DIAGNOSIS — Z9049 Acquired absence of other specified parts of digestive tract: Secondary | ICD-10-CM | POA: Insufficient documentation

## 2015-07-30 DIAGNOSIS — Z9181 History of falling: Secondary | ICD-10-CM | POA: Insufficient documentation

## 2015-10-02 ENCOUNTER — Encounter: Payer: Self-pay | Admitting: Emergency Medicine

## 2015-10-02 ENCOUNTER — Inpatient Hospital Stay
Admission: EM | Admit: 2015-10-02 | Discharge: 2015-10-04 | DRG: 065 | Disposition: A | Discharge status: Skilled Nursing Facility | Payer: Medicare Other | Attending: Internal Medicine | Admitting: Internal Medicine

## 2015-10-02 ENCOUNTER — Emergency Department: Payer: Medicare Other

## 2015-10-02 DIAGNOSIS — K219 Gastro-esophageal reflux disease without esophagitis: Secondary | ICD-10-CM | POA: Diagnosis present

## 2015-10-02 DIAGNOSIS — Z9181 History of falling: Secondary | ICD-10-CM

## 2015-10-02 DIAGNOSIS — R001 Bradycardia, unspecified: Secondary | ICD-10-CM | POA: Diagnosis present

## 2015-10-02 DIAGNOSIS — N400 Enlarged prostate without lower urinary tract symptoms: Secondary | ICD-10-CM | POA: Diagnosis present

## 2015-10-02 DIAGNOSIS — G4733 Obstructive sleep apnea (adult) (pediatric): Secondary | ICD-10-CM | POA: Diagnosis present

## 2015-10-02 DIAGNOSIS — M4802 Spinal stenosis, cervical region: Secondary | ICD-10-CM | POA: Diagnosis present

## 2015-10-02 DIAGNOSIS — R29701 NIHSS score 1: Secondary | ICD-10-CM | POA: Diagnosis present

## 2015-10-02 DIAGNOSIS — R32 Unspecified urinary incontinence: Secondary | ICD-10-CM | POA: Diagnosis present

## 2015-10-02 DIAGNOSIS — R531 Weakness: Secondary | ICD-10-CM | POA: Diagnosis not present

## 2015-10-02 DIAGNOSIS — I10 Essential (primary) hypertension: Secondary | ICD-10-CM | POA: Diagnosis present

## 2015-10-02 DIAGNOSIS — W19XXXA Unspecified fall, initial encounter: Secondary | ICD-10-CM | POA: Diagnosis present

## 2015-10-02 DIAGNOSIS — Z96653 Presence of artificial knee joint, bilateral: Secondary | ICD-10-CM | POA: Diagnosis present

## 2015-10-02 DIAGNOSIS — G8194 Hemiplegia, unspecified affecting left nondominant side: Secondary | ICD-10-CM | POA: Diagnosis present

## 2015-10-02 DIAGNOSIS — M549 Dorsalgia, unspecified: Secondary | ICD-10-CM | POA: Diagnosis present

## 2015-10-02 DIAGNOSIS — Z794 Long term (current) use of insulin: Secondary | ICD-10-CM

## 2015-10-02 DIAGNOSIS — I639 Cerebral infarction, unspecified: Principal | ICD-10-CM | POA: Diagnosis present

## 2015-10-02 DIAGNOSIS — R2681 Unsteadiness on feet: Secondary | ICD-10-CM | POA: Diagnosis present

## 2015-10-02 DIAGNOSIS — R269 Unspecified abnormalities of gait and mobility: Secondary | ICD-10-CM | POA: Diagnosis present

## 2015-10-02 DIAGNOSIS — Z87891 Personal history of nicotine dependence: Secondary | ICD-10-CM

## 2015-10-02 DIAGNOSIS — G459 Transient cerebral ischemic attack, unspecified: Secondary | ICD-10-CM

## 2015-10-02 DIAGNOSIS — Z8673 Personal history of transient ischemic attack (TIA), and cerebral infarction without residual deficits: Secondary | ICD-10-CM

## 2015-10-02 DIAGNOSIS — E1140 Type 2 diabetes mellitus with diabetic neuropathy, unspecified: Secondary | ICD-10-CM | POA: Diagnosis present

## 2015-10-02 DIAGNOSIS — Z7982 Long term (current) use of aspirin: Secondary | ICD-10-CM

## 2015-10-02 DIAGNOSIS — G8929 Other chronic pain: Secondary | ICD-10-CM | POA: Diagnosis present

## 2015-10-02 LAB — URINALYSIS COMPLETE WITH MICROSCOPIC (ARMC ONLY)
BACTERIA UA: NONE SEEN
Bilirubin Urine: NEGATIVE
Glucose, UA: NEGATIVE mg/dL
HGB URINE DIPSTICK: NEGATIVE
LEUKOCYTES UA: NEGATIVE
Nitrite: NEGATIVE
PH: 5 (ref 5.0–8.0)
Protein, ur: 30 mg/dL — AB
RBC / HPF: NONE SEEN RBC/hpf (ref 0–5)
SQUAMOUS EPITHELIAL / LPF: NONE SEEN
Specific Gravity, Urine: 1.025 (ref 1.005–1.030)

## 2015-10-02 LAB — CBC WITH DIFFERENTIAL/PLATELET
BASOS ABS: 0 10*3/uL (ref 0–0.1)
BASOS PCT: 1 %
EOS PCT: 2 %
Eosinophils Absolute: 0.1 10*3/uL (ref 0–0.7)
HEMATOCRIT: 36 % — AB (ref 40.0–52.0)
Hemoglobin: 12.4 g/dL — ABNORMAL LOW (ref 13.0–18.0)
Lymphocytes Relative: 25 %
Lymphs Abs: 1.5 10*3/uL (ref 1.0–3.6)
MCH: 31.1 pg (ref 26.0–34.0)
MCHC: 34.5 g/dL (ref 32.0–36.0)
MCV: 90.1 fL (ref 80.0–100.0)
MONO ABS: 0.5 10*3/uL (ref 0.2–1.0)
Monocytes Relative: 9 %
NEUTROS ABS: 3.7 10*3/uL (ref 1.4–6.5)
Neutrophils Relative %: 63 %
PLATELETS: 182 10*3/uL (ref 150–440)
RBC: 4 MIL/uL — ABNORMAL LOW (ref 4.40–5.90)
RDW: 13.3 % (ref 11.5–14.5)
WBC: 5.8 10*3/uL (ref 3.8–10.6)

## 2015-10-02 LAB — COMPREHENSIVE METABOLIC PANEL
ALBUMIN: 4 g/dL (ref 3.5–5.0)
ALT: 24 U/L (ref 17–63)
AST: 31 U/L (ref 15–41)
Alkaline Phosphatase: 58 U/L (ref 38–126)
Anion gap: 7 (ref 5–15)
BUN: 16 mg/dL (ref 6–20)
CHLORIDE: 106 mmol/L (ref 101–111)
CO2: 29 mmol/L (ref 22–32)
Calcium: 9.8 mg/dL (ref 8.9–10.3)
Creatinine, Ser: 0.9 mg/dL (ref 0.61–1.24)
GFR calc Af Amer: >60 mL/min (ref >60)
GFR calc non Af Amer: >60 mL/min (ref >60)
GLUCOSE: 119 mg/dL — AB (ref 65–99)
POTASSIUM: 3.4 mmol/L — AB (ref 3.5–5.1)
Sodium: 142 mmol/L (ref 135–145)
Total Bilirubin: 0.8 mg/dL (ref 0.3–1.2)
Total Protein: 7 g/dL (ref 6.5–8.1)

## 2015-10-02 LAB — TROPONIN I: Troponin I: <0.03 ng/mL (ref <0.031)

## 2015-10-02 LAB — GLUCOSE, CAPILLARY: GLUCOSE-CAPILLARY: 114 mg/dL — AB (ref 65–99)

## 2015-10-02 MED ORDER — ASPIRIN 81 MG PO CHEW
324.0000 mg | CHEWABLE_TABLET | Freq: Once | ORAL | Status: AC
  Administered 2015-10-02: 324 mg via ORAL
  Filled 2015-10-02: qty 4

## 2015-10-02 NOTE — ED Notes (Signed)
States started having weakness worse on left side on Friday. Fell twice since then.  Grips equal at this time, no slurred speech, face symmetrical.

## 2015-10-02 NOTE — ED Provider Notes (Signed)
[facility] Emergency Department Provider Note  Time seen: 9:37 PM  I have reviewed the triage vital signs and the nursing notes.   HISTORY  Chief Complaint Weakness    HPI David Sloan is a 75 y.o. male with a past medical history of hypertension, CVA, diabetes, who presents to the emergency department with generalized weakness and falls. According to the patient and family for the past 4 days the patient has been extremely unsteady, with multiple falls including a fall tonight hitting his head. Patient states he is unable to stand tonight due to generalized weakness in both of his legs. Denies any focal weakness or numbness.      Past Medical History  Diagnosis Date  . Essential hypertension   . Diabetes mellitus without complication ([place])   . Stroke [facility])     a. ~ 2002 - no residual deficits;  b. 03/2015 Head CT: prior old infarcts and foci of small vesel dzs in pons bilat. ? bilat occipital lobe acute infarcts R>L.  . Diabetic neuropathy ([place])   . BPH (benign prostatic hyperplasia)   . Urinary incontinence   . Atypical chest pain     a. 04/2013 MV: nl EF, no ischemia.  . Osteoarthritis     a. s/p bilat knee surgeries.  . OSA (obstructive sleep apnea)   . Chronic back pain   . Sinus bradycardia   . Falls   . Memory disorder   . GERD (gastroesophageal reflux disease)     Patient Active Problem List   Diagnosis Date Noted  . Scoliosis 04/13/2015  . Fibrositis 04/13/2015  . Slurred speech 03/31/2015  . Elevated transaminase level 03/31/2015  . Gallstones 03/31/2015  . Lower extremity weakness 03/30/2015  . Cerebral infarction due to unspecified mechanism   . Elevated troponin   . Uncontrolled type 2 diabetes mellitus with circulatory disorder ([place])   . Hyperlipidemia   . EKG abnormalities   . Emesis   . Gall bladder disease   . Other long term (current) drug therapy 02/13/2015  . Essential (primary) hypertension 09/18/2014  .  Neuralgia neuritis, sciatic nerve 09/15/2014  . Bradycardia 09/13/2014  . Cognitive disorder 04/05/2014  . Chronic pain associated with significant psychosocial dysfunction 01/17/2014  . Back ache 01/17/2014  . Esophageal stenosis 11/30/2013  . Can't get food down 11/30/2013  . Continuous opioid dependence ([place]) 07/06/2013  . Cerebrovascular accident (CVA) ([place]) 05/19/2013  . Adult hypothyroidism 05/19/2013  . Acid reflux 05/19/2013  . Lumbar and sacral osteoarthritis 01/11/2013  . Arthralgia, sacroiliac 10/06/2012  . Arthritis, degenerative 10/06/2012  . Obstructive apnea 10/06/2012  . Degeneration of intervertebral disc of lumbar region 10/06/2012  . Lumbar and sacral arthritis 10/06/2012  . Diabetes mellitus ([place]) 10/06/2012  . Punctate keratitis 01/21/2012  . 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[place]) 01/21/2012  . Type 2 diabetes mellitus ([place]) 10/16/2010  . Dry eye syndrome 10/16/2010  . Corneal changes, senile 10/16/2010  . Primary open angle glaucoma 10/16/2010  . Glaucoma 10/16/2010  . Nonproliferative diabetic retinopathy ([place]) 10/16/2010    Past Surgical History  Procedure Laterality Date  . Joint replacement      bilateral knee replacements    Current Outpatient Rx  Name  Route  Sig  Dispense  Refill  . amLODipine (NORVASC) 5 MG tablet   Oral   Take 5 mg by mouth daily.         Marland Kitchen aspirin EC 81 MG tablet   Oral   Take 81 mg by mouth.          Marland Kitchen  atorvastatin (LIPITOR) 20 MG tablet   Oral   Take 20 mg by mouth.         . Blood Glucose Monitoring Suppl (RA BLOOD GLUCOSE MONITOR) DEVI      Please provide ACCU-CHEK KIT         . EXPIRED: cloNIDine (CATAPRES) 0.1 MG tablet   Oral   Take 0.2 mg by mouth daily.         Marland Kitchen dexlansoprazole (DEXILANT) 60 MG capsule   Oral   Take 60 mg by mouth daily.         Marland Kitchen dipyridamole-aspirin (AGGRENOX) 200-25 MG 12hr capsule   Oral   Take 1 capsule by mouth 2 (two) times daily.         Marland Kitchen gabapentin (NEURONTIN) 300 MG  capsule   Oral   Take 300 mg by mouth 2 (two) times daily. Take 1 capsule every morning and 2 capsules every night         . insulin NPH Human (HUMULIN N) 100 UNIT/ML injection   Subcutaneous   Inject 30-35 Units into the skin 2 (two) times daily. 35 units before breakfast and 30 units before supper.         . losartan (COZAAR) 100 MG tablet   Oral   Take 100 mg by mouth daily.         Marland Kitchen lovastatin (MEVACOR) 40 MG tablet   Oral   Take 40 mg by mouth at bedtime.         Marland Kitchen oxyCODONE-acetaminophen (PERCOCET) 7.5-325 MG tablet   Oral   Take 1 tablet by mouth every 6 (six) hours as needed for moderate pain.          . pantoprazole (PROTONIX) 40 MG tablet   Oral   Take 40 mg by mouth 2 (two) times daily.         . tamsulosin (FLOMAX) 0.4 MG CAPS capsule   Oral   Take 0.4 mg by mouth daily.           Allergies Review of patient's allergies indicates no known allergies.  Family History  Problem Relation Age of Onset  . Alzheimer's disease Mother   . Diabetes Father     Social History Social History  Substance Use Topics  . Smoking status: Former Smoker -- 1.00 packs/day for 25 years    Types: Cigarettes  . Smokeless tobacco: Never Used  . Alcohol Use: No    Review of Systems Constitutional: Negative for fever. Cardiovascular: Negative for chest pain. Respiratory: Negative for shortness of breath. Gastrointestinal: Negative for abdominal pain Genitourinary: Negative for dysuria. Musculoskeletal: Negative for back pain. Neurological:Generalized weakness. 10-point ROS otherwise negative.  ____________________________________________   PHYSICAL EXAM:  VITAL SIGNS: ED Triage Vitals  Enc Vitals Group     BP 10/02/15 1612 151/59 mmHg     Pulse Rate 10/02/15 1612 51     Resp 10/02/15 1612 18     Temp 10/02/15 1612 98.3 F (36.8 C)     Temp Source 10/02/15 1612 Oral     SpO2 10/02/15 1612 99 %     Weight 10/02/15 1612 190 lb (86.183 kg)      Height 10/02/15 1612 5' 6" (1.676 m)     Head Cir --      Peak Flow --      Pain Score 10/02/15 1900 0     Pain Loc --      Pain Edu? --      Excl.  in [place]? --     Constitutional: Alert and oriented. Well appearing and in no distress. Eyes: Normal exam ENT   Head: Normocephalic and atraumatic.   Mouth/Throat: Mucous membranes are moist. Cardiovascular: Normal rate, regular rhythm. Respiratory: Normal respiratory effort without tachypnea nor retractions. Breath sounds are clear  Gastrointestinal: Soft and nontender. No distention.  Musculoskeletal: Nontender with normal range of motion in all extremities. Neurologic:  4+/5 motor in left upper extremity, 4/5 motor in left lower extremity. Mild pronator drift in left upper extremity. Possible slight facial droop to left face, although family states it appears normal. Upon ambulation the patient is unable to advance his left foot. Unable to bear his own weight. Skin:  Skin is warm, dry and intact.  Psychiatric: Mood and affect are normal. Speech and behavior are normal.   ____________________________________________    EKG  EKG reviewed and interpreted by myself shows sinus bradycardia at 50 bpm, narrow QRS, normal axis, normal intervals, nonspecific ST changes. Besides bradycardia no acute abnormality.  ____________________________________________    RADIOLOGY  CT head shows chronic white matter disease old left frontal and occipital infarcts.  ____________________________________________    INITIAL IMPRESSION / ASSESSMENT AND PLAN / ED COURSE  Pertinent labs & imaging results that were available during my care of the patient were reviewed by me and considered in my medical decision making (see chart for details).  Patient presents the emergency department with diffuse weakness being unable to ambulate for the past several days. According to family was now with the patient the state normally the patient drives, is very  independent walks without difficulty. The past 3 days or so the patient has had significant difficulty walking, is unable to bear his own weight, needs assistance walking anywhere and appears to fall to his left at times. On exam the patient has 4+/5 motor in the left upper extremity, 4/5 motor in the left lower extremity. Decreased grip strength in the left upper extremity as well with a mild left pronator drift. On attempted ambulation patient cannot bear his own weight, is unable to move his left foot forward. Patient is old CVAs appear to be affecting his left hemisphere which would not account for today's findings. Given the patient's acute decline with recent falls including one tonight and left-sided deficits noted on exam highly suspect the patient has suffered another CVA likely affecting the right hemisphere. Patient's CT scan shows no acute abnormality at this time but it does show the patient has old left sided CVAs. Patient's labs are all within normal limits.   NIH Stroke Scale   Time: 10:54 PM Person Administering Scale: ,   Administer stroke scale items in the order listed. Record performance in each category after each subscale exam. Do not go back and change scores. Follow directions provided for each exam technique. Scores should reflect what the patient does, not what the clinician thinks the patient can do. The clinician should record answers while administering the exam and work quickly. Except where indicated, the patient should not be coached (i.e., repeated requests to patient to make a special effort).   1a  Level of consciousness: 0=alert; keenly responsive  1b. LOC questions:  0=Performs both tasks correctly  1c. LOC commands: 0=Performs both tasks correctly  2.  Best Gaze: 0=normal  3.  Visual: 0=No visual loss  4. Facial Palsy: 1=Minor paralysis (flattened nasolabial fold, asymmetric on smiling)  5a.  Motor left arm: 1=Drift, limb holds 90 (or 45)  degrees but drifts  down before full 10 seconds: does not hit bed  5b.  Motor right arm: 0=No drift, limb holds 90 (or 45) degrees for full 10 seconds  6a. motor left leg: 2=Some effort against gravity, limb cannot get to or maintain (if cured) 90 (or 45) degrees, drifts down to bed, but has some effort against gravity  6b  Motor right leg:  0=No drift, limb holds 90 (or 45) degrees for full 10 seconds  7. Limb Ataxia: 0=Absent  8.  Sensory: 0=Normal; no sensory loss  9. Best Language:  0=No aphasia, normal  10. Dysarthria: 0=Normal  11. Extinction and Inattention: 0=No abnormality  12. Distal motor function: 0=Normal   Total:   4    ____________________________________________   FINAL CLINICAL IMPRESSION(S) / ED DIAGNOSES  Left-sided weakness Falls CVA  Harvest Dark, MD 10/02/15 2255

## 2015-10-03 ENCOUNTER — Observation Stay (HOSPITAL_COMMUNITY)
Admit: 2015-10-03 | Discharge: 2015-10-03 | Disposition: A | Discharge status: Home / Self Care | Payer: Medicare Other | Attending: Internal Medicine | Admitting: Internal Medicine

## 2015-10-03 ENCOUNTER — Observation Stay: Payer: Medicare Other

## 2015-10-03 ENCOUNTER — Encounter: Payer: Self-pay | Admitting: Internal Medicine

## 2015-10-03 DIAGNOSIS — R32 Unspecified urinary incontinence: Secondary | ICD-10-CM | POA: Diagnosis present

## 2015-10-03 DIAGNOSIS — G459 Transient cerebral ischemic attack, unspecified: Secondary | ICD-10-CM

## 2015-10-03 DIAGNOSIS — G8194 Hemiplegia, unspecified affecting left nondominant side: Secondary | ICD-10-CM | POA: Diagnosis present

## 2015-10-03 DIAGNOSIS — G8929 Other chronic pain: Secondary | ICD-10-CM | POA: Diagnosis present

## 2015-10-03 DIAGNOSIS — Z96653 Presence of artificial knee joint, bilateral: Secondary | ICD-10-CM | POA: Diagnosis present

## 2015-10-03 DIAGNOSIS — R2681 Unsteadiness on feet: Secondary | ICD-10-CM | POA: Diagnosis present

## 2015-10-03 DIAGNOSIS — K219 Gastro-esophageal reflux disease without esophagitis: Secondary | ICD-10-CM | POA: Diagnosis present

## 2015-10-03 DIAGNOSIS — R001 Bradycardia, unspecified: Secondary | ICD-10-CM | POA: Diagnosis present

## 2015-10-03 DIAGNOSIS — Z794 Long term (current) use of insulin: Secondary | ICD-10-CM | POA: Diagnosis not present

## 2015-10-03 DIAGNOSIS — I639 Cerebral infarction, unspecified: Principal | ICD-10-CM | POA: Diagnosis present

## 2015-10-03 DIAGNOSIS — Z87891 Personal history of nicotine dependence: Secondary | ICD-10-CM | POA: Diagnosis not present

## 2015-10-03 DIAGNOSIS — N400 Enlarged prostate without lower urinary tract symptoms: Secondary | ICD-10-CM | POA: Diagnosis present

## 2015-10-03 DIAGNOSIS — Z7982 Long term (current) use of aspirin: Secondary | ICD-10-CM | POA: Diagnosis not present

## 2015-10-03 DIAGNOSIS — R29701 NIHSS score 1: Secondary | ICD-10-CM | POA: Diagnosis present

## 2015-10-03 DIAGNOSIS — M4802 Spinal stenosis, cervical region: Secondary | ICD-10-CM | POA: Diagnosis present

## 2015-10-03 DIAGNOSIS — R269 Unspecified abnormalities of gait and mobility: Secondary | ICD-10-CM | POA: Diagnosis present

## 2015-10-03 DIAGNOSIS — M549 Dorsalgia, unspecified: Secondary | ICD-10-CM | POA: Diagnosis present

## 2015-10-03 DIAGNOSIS — Z8673 Personal history of transient ischemic attack (TIA), and cerebral infarction without residual deficits: Secondary | ICD-10-CM | POA: Diagnosis not present

## 2015-10-03 DIAGNOSIS — I10 Essential (primary) hypertension: Secondary | ICD-10-CM | POA: Diagnosis present

## 2015-10-03 DIAGNOSIS — W19XXXA Unspecified fall, initial encounter: Secondary | ICD-10-CM | POA: Diagnosis present

## 2015-10-03 DIAGNOSIS — R531 Weakness: Secondary | ICD-10-CM | POA: Diagnosis present

## 2015-10-03 DIAGNOSIS — Z9181 History of falling: Secondary | ICD-10-CM | POA: Diagnosis not present

## 2015-10-03 DIAGNOSIS — G4733 Obstructive sleep apnea (adult) (pediatric): Secondary | ICD-10-CM | POA: Diagnosis present

## 2015-10-03 DIAGNOSIS — E1140 Type 2 diabetes mellitus with diabetic neuropathy, unspecified: Secondary | ICD-10-CM | POA: Diagnosis present

## 2015-10-03 LAB — CBC
HCT: 33.9 % — ABNORMAL LOW (ref 40.0–52.0)
Hemoglobin: 11.7 g/dL — ABNORMAL LOW (ref 13.0–18.0)
MCH: 31.3 pg (ref 26.0–34.0)
MCHC: 34.6 g/dL (ref 32.0–36.0)
MCV: 90.5 fL (ref 80.0–100.0)
Platelets: 149 10*3/uL — ABNORMAL LOW (ref 150–440)
RBC: 3.74 MIL/uL — ABNORMAL LOW (ref 4.40–5.90)
RDW: 13.3 % (ref 11.5–14.5)
WBC: 4.7 10*3/uL (ref 3.8–10.6)

## 2015-10-03 LAB — ECHOCARDIOGRAM COMPLETE
HEIGHTINCHES: 66 in
Weight: 2896 oz

## 2015-10-03 LAB — LIPID PANEL
Cholesterol: 110 mg/dL (ref 0–200)
HDL: 42 mg/dL (ref >40)
LDL CALC: 57 mg/dL (ref 0–99)
Total CHOL/HDL Ratio: 2.6 RATIO
Triglycerides: 55 mg/dL (ref <150)
VLDL: 11 mg/dL (ref 0–40)

## 2015-10-03 LAB — GLUCOSE, CAPILLARY
Glucose-Capillary: 130 mg/dL — ABNORMAL HIGH (ref 65–99)
Glucose-Capillary: 172 mg/dL — ABNORMAL HIGH (ref 65–99)
Glucose-Capillary: 121 mg/dL — ABNORMAL HIGH (ref 65–99)
Glucose-Capillary: 111 mg/dL — ABNORMAL HIGH (ref 65–99)
Glucose-Capillary: 121 mg/dL — ABNORMAL HIGH (ref 65–99)

## 2015-10-03 LAB — HEMOGLOBIN A1C: Hgb A1c MFr Bld: 6.4 % — ABNORMAL HIGH (ref 4.0–6.0)

## 2015-10-03 LAB — CREATININE, SERUM
CREATININE: 0.95 mg/dL (ref 0.61–1.24)
GFR calc Af Amer: >60 mL/min (ref >60)

## 2015-10-03 MED ORDER — ENOXAPARIN SODIUM 40 MG/0.4ML ~~LOC~~ SOLN
40.0000 mg | SUBCUTANEOUS | Status: DC
  Administered 2015-10-03: 40 mg via SUBCUTANEOUS
  Filled 2015-10-03: qty 0.4

## 2015-10-03 MED ORDER — PANTOPRAZOLE SODIUM 40 MG PO TBEC
40.0000 mg | DELAYED_RELEASE_TABLET | Freq: Two times a day (BID) | ORAL | Status: DC
  Administered 2015-10-03 – 2015-10-04 (×4): 40 mg via ORAL
  Filled 2015-10-03 (×4): qty 1

## 2015-10-03 MED ORDER — INSULIN ASPART 100 UNIT/ML ~~LOC~~ SOLN
0.0000 [IU] | Freq: Three times a day (TID) | SUBCUTANEOUS | Status: DC
  Administered 2015-10-03: 1 [IU] via SUBCUTANEOUS
  Administered 2015-10-03: 13:00:00 2 [IU] via SUBCUTANEOUS
  Administered 2015-10-04: 12:00:00 1 [IU] via SUBCUTANEOUS
  Filled 2015-10-03 (×2): qty 1
  Filled 2015-10-03: qty 2

## 2015-10-03 MED ORDER — ACETAMINOPHEN 325 MG PO TABS
650.0000 mg | ORAL_TABLET | ORAL | Status: DC | PRN
  Administered 2015-10-03 (×2): 650 mg via ORAL
  Filled 2015-10-03 (×2): qty 2

## 2015-10-03 MED ORDER — CLOPIDOGREL BISULFATE 75 MG PO TABS
75.0000 mg | ORAL_TABLET | Freq: Every day | ORAL | Status: DC
  Administered 2015-10-03 – 2015-10-04 (×2): 75 mg via ORAL
  Filled 2015-10-03 (×2): qty 1

## 2015-10-03 MED ORDER — INSULIN DETEMIR 100 UNIT/ML ~~LOC~~ SOLN
35.0000 [IU] | Freq: Every day | SUBCUTANEOUS | Status: DC
  Administered 2015-10-03 – 2015-10-04 (×2): 35 [IU] via SUBCUTANEOUS
  Filled 2015-10-03 (×3): qty 0.35

## 2015-10-03 MED ORDER — TAMSULOSIN HCL 0.4 MG PO CAPS
0.4000 mg | ORAL_CAPSULE | Freq: Every day | ORAL | Status: DC
  Administered 2015-10-03 – 2015-10-04 (×2): 0.4 mg via ORAL
  Filled 2015-10-03 (×2): qty 1

## 2015-10-03 MED ORDER — ASPIRIN-DIPYRIDAMOLE ER 25-200 MG PO CP12
1.0000 | ORAL_CAPSULE | Freq: Two times a day (BID) | ORAL | Status: DC
  Administered 2015-10-03 (×2): 1 via ORAL
  Filled 2015-10-03 (×2): qty 1

## 2015-10-03 MED ORDER — CLONIDINE HCL 0.1 MG PO TABS
0.2000 mg | ORAL_TABLET | Freq: Every day | ORAL | Status: DC
  Administered 2015-10-03: 0.2 mg via ORAL
  Filled 2015-10-03: qty 2

## 2015-10-03 MED ORDER — CLONIDINE HCL 0.1 MG PO TABS
0.1000 mg | ORAL_TABLET | Freq: Every day | ORAL | Status: DC
  Administered 2015-10-04: 0.1 mg via ORAL
  Filled 2015-10-03: qty 1

## 2015-10-03 MED ORDER — INSULIN NPH (HUMAN) (ISOPHANE) 100 UNIT/ML ~~LOC~~ SUSP: 30.0000 [IU] | Freq: Two times a day (BID) | SUBCUTANEOUS | Status: DC

## 2015-10-03 MED ORDER — SENNOSIDES-DOCUSATE SODIUM 8.6-50 MG PO TABS: 1.0000 | ORAL_TABLET | Freq: Every evening | ORAL | Status: DC | PRN

## 2015-10-03 MED ORDER — ASPIRIN 81 MG PO CHEW
81.0000 mg | CHEWABLE_TABLET | Freq: Every day | ORAL | Status: DC
  Administered 2015-10-03 – 2015-10-04 (×2): 81 mg via ORAL
  Filled 2015-10-03 (×2): qty 1

## 2015-10-03 MED ORDER — POTASSIUM CHLORIDE CRYS ER 20 MEQ PO TBCR
20.0000 meq | EXTENDED_RELEASE_TABLET | Freq: Once | ORAL | Status: AC
  Administered 2015-10-03: 20 meq via ORAL
  Filled 2015-10-03: qty 1

## 2015-10-03 MED ORDER — OXYBUTYNIN CHLORIDE ER 5 MG PO TB24
5.0000 mg | ORAL_TABLET | Freq: Every day | ORAL | Status: DC
  Administered 2015-10-03 (×2): 5 mg via ORAL
  Filled 2015-10-03 (×2): qty 1

## 2015-10-03 MED ORDER — ACETAMINOPHEN 650 MG RE SUPP: 650.0000 mg | RECTAL | Status: DC | PRN

## 2015-10-03 MED ORDER — OXYCODONE-ACETAMINOPHEN 7.5-325 MG PO TABS: 1.0000 | ORAL_TABLET | Freq: Four times a day (QID) | ORAL | Status: DC | PRN

## 2015-10-03 MED ORDER — ATORVASTATIN CALCIUM 20 MG PO TABS
40.0000 mg | ORAL_TABLET | Freq: Every day | ORAL | Status: DC
  Administered 2015-10-03: 18:00:00 40 mg via ORAL
  Filled 2015-10-03: qty 2

## 2015-10-03 MED ORDER — LOSARTAN POTASSIUM 50 MG PO TABS
100.0000 mg | ORAL_TABLET | Freq: Every day | ORAL | Status: DC
  Administered 2015-10-03 – 2015-10-04 (×2): 100 mg via ORAL
  Filled 2015-10-03 (×2): qty 2

## 2015-10-03 MED ORDER — STROKE: EARLY STAGES OF RECOVERY BOOK
Freq: Once | Status: AC
  Administered 2015-10-03: 03:00:00

## 2015-10-03 MED ORDER — INSULIN DETEMIR 100 UNIT/ML ~~LOC~~ SOLN
30.0000 [IU] | Freq: Every day | SUBCUTANEOUS | Status: DC
  Administered 2015-10-03: 30 [IU] via SUBCUTANEOUS
  Filled 2015-10-03 (×2): qty 0.3

## 2015-10-03 MED ORDER — ATORVASTATIN CALCIUM 20 MG PO TABS: 20.0000 mg | ORAL_TABLET | Freq: Every day | ORAL | Status: DC

## 2015-10-03 NOTE — Progress Notes (Signed)
Pt transported to US

## 2015-10-03 NOTE — ED Notes (Signed)
Report attempted x 1

## 2015-10-03 NOTE — Progress Notes (Signed)
Rn notified by CT technician about duplicate order for head CT. MD Pyreddy notified. Per MD order, RN will discontinue head CT order.   David Sloan, Roye Gustafson M

## 2015-10-03 NOTE — Clinical Social Work Note (Signed)
Clinical Social Work Assessment  Patient Details  Name: David Sloan MRN: [id number] Date of Birth: 03/12/1941  Date of referral:  10/03/15               Reason for consult:  Facility Placement                Permission sought to share information with:  Family Supports Permission granted to share information::  Yes, Verbal Permission Granted  Name::      David Sloan, wife   Housing/Transportation Living arrangements for the past 2 months:  Single Family Home Source of Information:  Patient Patient Interpreter Needed:  None Criminal Activity/Legal Involvement Pertinent to Current Situation/Hospitalization:  No - Comment as needed Significant Relationships:  Adult Children, Spouse Lives with:  Spouse Do you feel safe going back to the place where you live?  Yes Need for family participation in patient care:  Yes (Comment)  Care giving concerns:  No care giving concerns identified.   Social Worker assessment / plan:  CSW met with pt to address consult for STR at SNF as recommended by PT. CSW introduced herself and explained role of social work. CSW also explained the process of discharging to SNF. Pt has not been to SNF in the past and is interested in [facility]. CSW spoke with pt's wife on the phone, who is also agreeable to discharge plan. CSW initiated SNF search, and will follow up with bed offers. CSW updated admissions coordinator at [facility], who will review referral. CSW will continue to follow.   Employment status:  Retired Insurance information:  Managed Medicare PT Recommendations:  Skilled Nursing Facility Information / Referral to community resources:  Skilled Nursing Facility  Patient/Family's Response to care:  Pt and wife were appreciative of CSW support.   Patient/Family's Understanding of and Emotional Response to Diagnosis, Current Treatment, and Prognosis:  Pt and wife are aware of discharge needs and are in agreement for STR.   Emotional  Assessment Appearance:  Appears stated age Attitude/Demeanor/Rapport:  Other (Appropriate) Affect (typically observed):  Accepting, Adaptable, Pleasant Orientation:  Oriented to Self, Oriented to Place, Oriented to  Time, Oriented to Situation Alcohol / Substance use:  Never Used Psych involvement (Current and /or in the community):  No (Comment)  Discharge Needs  Concerns to be addressed:  Adjustment to Illness Readmission within the last 30 days:  No Current discharge risk:  Chronically ill Barriers to Discharge:  Continued Medical Work up    , LCSW 10/03/2015, 12:13 PM  

## 2015-10-03 NOTE — Progress Notes (Signed)
Speech Therapy Note: received order, reviewed chart notes; consulted NSG. Met w/ pt who denied any difficulty swallowing - noted pt had eaten 50% or more of his breakfast meal on the table. NSG denied any reports of difficulty swallowing pills. Pt communicated at conversational level w/ mild hesitations or slower responses intermittently - pt does have a baseline of Memory Disorder, possible Cognitive change(?), per chart notes.  Pt appears at his baseline at this time per presentation and his report. Recommend f/u w/ Neurologist if need to determine a baseline Cognitive status or functioning in regard to ADLs as pt does live at home independently. NSG updated and agreed. No skilled ST services indicated currently. Pt agreed.

## 2015-10-03 NOTE — Progress Notes (Signed)
*  PRELIMINARY RESULTS* Echocardiogram 2D Echocardiogram has been performed.  Georgann HousekeeperJerry R Hege 10/03/2015, 11:20 AM

## 2015-10-03 NOTE — Consult Note (Signed)
Referring Physician: Leslye Peer    Chief Complaint: Left sided weakness  HPI: David Sloan is an 75 y.o. male who reports that last week he had the onset of right sided weakness.  He started doing his own physical therapy at home and it was starting to improve.  Then on Friday patient noted weakness on the right side.  He reports that it started about 3p.  He fell twice because of his weakness and with no improvement presented for evaluation.  He feels that he is now weak on both side with the left being the worst.   Initial NIHSS of 1  Date last known well: Unable to determine Time last known well: Unable to determine tPA Given: No: Outside time window  Past Medical History  Diagnosis Date  . Essential hypertension   . Diabetes mellitus without complication ([place])   . Stroke [facility])     a. ~ 2002 - no residual deficits;  b. 03/2015 Head CT: prior old infarcts and foci of small vesel dzs in pons bilat. ? bilat occipital lobe acute infarcts R>L.  . Diabetic neuropathy ([place])   . BPH (benign prostatic hyperplasia)   . Urinary incontinence   . Atypical chest pain     a. 04/2013 MV: nl EF, no ischemia.  . Osteoarthritis     a. s/p bilat knee surgeries.  . OSA (obstructive sleep apnea)   . Chronic back pain   . Sinus bradycardia   . Falls   . Memory disorder   . GERD (gastroesophageal reflux disease)     Past Surgical History  Procedure Laterality Date  . Joint replacement      bilateral knee replacements    Family History  Problem Relation Age of Onset  . Alzheimer's disease Mother   . Diabetes Father    Social History:  reports that he has quit smoking. His smoking use included Cigarettes. He has a 25 pack-year smoking history. He has never used smokeless tobacco. He reports that he does not drink alcohol or use illicit drugs.  Allergies: No Known Allergies  Medications:  I have reviewed the patient's current medications. Prior to Admission:  Prescriptions prior to  admission  Medication Sig Dispense Refill Last Dose  . amLODipine (NORVASC) 5 MG tablet Take 5 mg by mouth daily.   10/02/2015 at Unknown time  . atorvastatin (LIPITOR) 20 MG tablet Take 20 mg by mouth.   10/02/2015 at Unknown time  . Blood Glucose Monitoring Suppl (RA BLOOD GLUCOSE MONITOR) DEVI Please provide ACCU-CHEK KIT   10/02/2015 at Unknown time  . cloNIDine (CATAPRES) 0.1 MG tablet Take 0.2 mg by mouth daily.   10/02/2015 at Unknown time  . dipyridamole-aspirin (AGGRENOX) 200-25 MG 12hr capsule Take 1 capsule by mouth 2 (two) times daily.   10/02/2015 at Unknown time  . insulin NPH Human (HUMULIN N) 100 UNIT/ML injection Inject 30-35 Units into the skin 2 (two) times daily. 35 units before breakfast and 30 units before supper.   10/02/2015 at 0800  . losartan (COZAAR) 100 MG tablet Take 100 mg by mouth daily.   10/02/2015 at Unknown time  . oxybutynin (DITROPAN-XL) 5 MG 24 hr tablet Take 5 mg by mouth at bedtime.   10/01/2015 at Unknown time  . oxyCODONE-acetaminophen (PERCOCET) 7.5-325 MG tablet Take 1 tablet by mouth every 6 (six) hours as needed for moderate pain.    PRN at PRN  . pantoprazole (PROTONIX) 40 MG tablet Take 40 mg by mouth 2 (two) times  daily.   10/02/2015 at Unknown time  . tamsulosin (FLOMAX) 0.4 MG CAPS capsule Take 0.4 mg by mouth daily.   10/02/2015 at Unknown time   Scheduled: . atorvastatin  40 mg Oral q1800  . [START ON 10/04/2015] cloNIDine  0.1 mg Oral Daily  . clopidogrel  75 mg Oral Daily  . enoxaparin (LOVENOX) injection  40 mg Subcutaneous Q24H  . insulin aspart  0-9 Units Subcutaneous TID WC  . insulin detemir  30 Units Subcutaneous QAC supper  . insulin detemir  35 Units Subcutaneous QAC breakfast  . losartan  100 mg Oral Daily  . oxybutynin  5 mg Oral QHS  . pantoprazole  40 mg Oral BID  . tamsulosin  0.4 mg Oral Daily    ROS: History obtained from the patient  General ROS: negative for - chills, fatigue, fever, night sweats, weight gain or weight  loss Psychological ROS: negative for - behavioral disorder, hallucinations, memory difficulties, mood swings or suicidal ideation Ophthalmic ROS: negative for - blurry vision, double vision, eye pain or loss of vision ENT ROS: negative for - epistaxis, nasal discharge, oral lesions, sore throat, tinnitus or vertigo Allergy and Immunology ROS: negative for - hives or itchy/watery eyes Hematological and Lymphatic ROS: negative for - bleeding problems, bruising or swollen lymph nodes Endocrine ROS: negative for - galactorrhea, hair pattern changes, polydipsia/polyuria or temperature intolerance Respiratory ROS: negative for - cough, hemoptysis, shortness of breath or wheezing Cardiovascular ROS: negative for - chest pain, dyspnea on exertion, edema or irregular heartbeat Gastrointestinal ROS: negative for - abdominal pain, diarrhea, hematemesis, nausea/vomiting or stool incontinence Genito-Urinary ROS: negative for - dysuria, hematuria, incontinence or urinary frequency/urgency Musculoskeletal ROS: negative for - joint swelling or muscular weakness Neurological ROS: as noted in HPI Dermatological ROS: negative for rash and skin lesion changes  Physical Examination: Blood pressure 165/60, pulse 40, temperature 97.5 F (36.4 C), temperature source Oral, resp. rate 18, height 5' 6" (1.676 m), weight 82.101 kg (181 lb), SpO2 100 %.  HEENT-  Normocephalic, no lesions, without obvious abnormality.  Normal external eye and conjunctiva.  Normal TM's bilaterally.  Normal auditory canals and external ears. Normal external nose, mucus membranes and septum.  Normal pharynx. Cardiovascular- S1, S2 normal, pulses palpable throughout   Lungs- chest clear, no wheezing, rales, normal symmetric air entry Abdomen- soft, non-tender; bowel sounds normal; no masses,  no organomegaly Extremities- no edema Lymph-no adenopathy palpable Musculoskeletal-no joint tenderness, deformity or swelling Skin-warm and dry, no  hyperpigmentation, vitiligo, or suspicious lesions  Neurological Examination Mental Status: Alert, oriented, thought content appropriate.  Some memory deficits noted.  Speech fluent without evidence of aphasia.  Able to follow 3 step commands without difficulty. Cranial Nerves: II: Discs flat bilaterally; Visual fields grossly normal, pupils equal, round, reactive to light and accommodation III,IV, VI: ptosis not present, extra-ocular motions intact bilaterally V,VII: left facial droop, facial light touch sensation normal bilaterally VIII: hearing normal bilaterally IX,X: gag reflex present XI: bilateral shoulder shrug XII: midline tongue extension Motor: Right : Upper extremity   5/5    Left:     Upper extremity   4+/5  Lower extremity   5/5     Lower extremity   5-/5 Tone and bulk:normal tone throughout; no atrophy noted Sensory: Pinprick and light touch intact throughout, bilaterally Deep Tendon Reflexes: 2+ and symmetric with absent AJ's bilaterally Plantars: Right: downgoing   Left: downgoing Cerebellar: Normal finger-to-nose and normal heel-to-shin testing bilaterally Gait: not tested due to safety concerns  Laboratory Studies:  Basic Metabolic Panel:  Recent Labs Lab 10/02/15 1621 10/03/15 0601  NA 142  --   K 3.4*  --   CL 106  --   CO2 29  --   GLUCOSE 119*  --   BUN 16  --   CREATININE 0.90 0.95  CALCIUM 9.8  --     Liver Function Tests:  Recent Labs Lab 10/02/15 1621  AST 31  ALT 24  ALKPHOS 58  BILITOT 0.8  PROT 7.0  ALBUMIN 4.0   No results for input(s): LIPASE, AMYLASE in the last 168 hours. No results for input(s): AMMONIA in the last 168 hours.  CBC:  Recent Labs Lab 10/02/15 1621 10/03/15 0601  WBC 5.8 4.7  NEUTROABS 3.7  --   HGB 12.4* 11.7*  HCT 36.0* 33.9*  MCV 90.1 90.5  PLT 182 149*    Cardiac Enzymes:  Recent Labs Lab 10/02/15 1621  TROPONINI <0.03    BNP: Invalid input(s): POCBNP  CBG:  Recent Labs Lab  10/02/15 2146 10/03/15 0816 10/03/15 1224 10/03/15 1515  GLUCAP 114* 130* 172* 121*    Microbiology: Results for orders placed or performed during the hospital encounter of 05/28/15  Blood culture (routine x 2)     Status: None   Collection Time: 05/28/15  3:56 AM  Result Value Ref Range Status   Specimen Description BLOOD RIGHT HAND  Final   Special Requests BOTTLES DRAWN AEROBIC AND ANAEROBIC 4CC  Final   Culture NO GROWTH 5 DAYS  Final   Report Status 06/02/2015 FINAL  Final    Coagulation Studies: No results for input(s): LABPROT, INR in the last 72 hours.  Urinalysis:  Recent Labs Lab 10/02/15 1900  COLORURINE YELLOW*  LABSPEC 1.025  PHURINE 5.0  GLUCOSEU NEGATIVE  HGBUR NEGATIVE  BILIRUBINUR NEGATIVE  KETONESUR TRACE*  PROTEINUR 30*  NITRITE NEGATIVE  LEUKOCYTESUR NEGATIVE    Lipid Panel:    Component Value Date/Time   CHOL 110 10/03/2015 0601   TRIG 55 10/03/2015 0601   HDL 42 10/03/2015 0601   CHOLHDL 2.6 10/03/2015 0601   VLDL 11 10/03/2015 0601   LDLCALC 57 10/03/2015 0601    HgbA1C:  Lab Results  Component Value Date   HGBA1C 6.4* 10/03/2015    Urine Drug Screen:  No results found for: LABOPIA, COCAINSCRNUR, LABBENZ, AMPHETMU, THCU, LABBARB  Alcohol Level: No results for input(s): ETH in the last 168 hours.  Other results: EKG: sinus bradycardia at 50 bpm  Imaging: Ct Head Wo Contrast  10/02/2015  CLINICAL DATA:  Left-sided weakness. EXAM: CT HEAD WITHOUT CONTRAST TECHNIQUE: Contiguous axial images were obtained from the base of the skull through the vertex without intravenous contrast. COMPARISON:  CT scan of March 30, 2015. FINDINGS: Bony calvarium appears intact. Mild chronic ischemic white matter disease is noted. Left frontal encephalomalacia is noted consistent with old infarction. Left occipital encephalomalacia is also noted consistent with old infarction. No mass effect or midline shift is noted. Ventricular size is within normal  limits. There is no evidence of mass lesion, hemorrhage or acute infarction. IMPRESSION: Mild chronic ischemic white matter disease. 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 No acute intracranial abnormality seen. Electronically Signed   By: Marijo Conception, M.D.   On: 10/02/2015 17:03   Mr Brain Wo Contrast  10/03/2015  CLINICAL DATA:  History of multiple falls and generalized weakness. EXAM: MRI HEAD WITHOUT CONTRAST MRA HEAD WITHOUT CONTRAST TECHNIQUE: Multiplanar, multiecho pulse sequences of the brain and  surrounding structures were obtained without intravenous contrast. Angiographic images of the head were obtained using MRA technique without contrast. COMPARISON:  03/30/2015 brain MRI FINDINGS: MRI HEAD FINDINGS Calvarium and upper cervical spine: No focal marrow signal abnormality. Upper cervical facet arthropathy and disc degeneration with C3-4 and C4-5 canal stenosis. Orbits: Negative. Sinuses and Mastoids: Clear. Brain: 5 mm right subthalamic acute infarct, nonhemorrhagic. Remote lacunar infarcts in the right thalamus, deep cerebral white matter tracts, right caudate body, and right pons. Remote small to moderate cortical infarcts in the left occipital pole and high parasagittal left frontal lobe. Few foci of chronic microhemorrhage without specific pattern, likely post ischemic. No hemorrhage, hydrocephalus, mass lesion, or evidence of major vessel occlusion MRA HEAD FINDINGS Symmetric carotid and vertebral arteries. Symmetric and unremarkable vertebrobasilar branching. Small of any communicating arteries. Atherosclerotic type irregularity of the bilateral carotid siphons, with moderate stenosis at the right anterior genu. The M1 and A1 segments are patent there is a high-grade distal left M1 segment stenosis. Focal apparent advanced narrowing of the lower branch right M2 vessel is likely combination of poor signal and atherosclerosis. No evidence of major branch occlusion. Smooth and widely patent  vertebral, basilar, and proximal posterior cerebral arteries. Beginning at the P3 segments there is atherosclerotic irregularity and narrowing When accounting for infundibula at the ophthalmic origins no aneurysm is suspected. IMPRESSION: 1. 5 mm acute right subthalamic infarct. 2. Extensive chronic ischemic injury as described above. 3. No acute arterial finding. 4. Intracranial atherosclerosis with high-grade left distal M1 and right M2 stenoses. 5. 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 Electronically Signed   By: Monte Fantasia M.D.   On: 10/03/2015 12:37   US Carotid Bilateral  10/03/2015  CLINICAL DATA:  75 year old male with symptoms of transient ischemic attack EXAM: BILATERAL CAROTID DUPLEX ULTRASOUND TECHNIQUE: Pearline Cables scale imaging, color Doppler and duplex ultrasound were performed of bilateral carotid and vertebral arteries in the neck. COMPARISON:  Prior duplex carotid ultrasound 03/30/2015 ; prior CTA neck 03/30/2015 FINDINGS: Criteria: Quantification of carotid stenosis is based on velocity parameters that correlate the residual internal carotid diameter with NASCET-based stenosis levels, using the diameter of the distal internal carotid lumen as the denominator for stenosis measurement. The following velocity measurements were obtained: RIGHT ICA:  60/8 cm/sec CCA:  09/6 cm/sec SYSTOLIC ICA/CCA RATIO:  0.6 DIASTOLIC ICA/CCA RATIO:  1.7 ECA:  149 cm/sec LEFT ICA:  84/12 cm/sec CCA:  28/3 cm/sec SYSTOLIC ICA/CCA RATIO:  1.1 DIASTOLIC ICA/CCA RATIO:  1.7 ECA:  85 cm/sec RIGHT CAROTID ARTERY: Stable appearance of mild smooth heterogeneous atherosclerotic plaque in the carotid bifurcation and proximal internal carotid artery. By peak systolic velocity criteria the estimated stenosis remains less than 50%. RIGHT VERTEBRAL ARTERY:  Patent with normal antegrade flow. LEFT CAROTID ARTERY: Stable appearance of mild heterogeneous but smooth atherosclerotic plaque in the  proximal internal carotid artery. By peak systolic velocity criteria the estimated stenosis remains less than 50%. LEFT VERTEBRAL ARTERY:  Patent with normal antegrade flow. IMPRESSION: 1. Mild (1-49%) stenosis proximal right internal carotid artery secondary to smooth heterogeneous atherosclerotic plaque. 2. Mild (1-49%) stenosis proximal left internal carotid artery secondary to smooth, heterogeneous atherosclerotic plaque. 3. Vertebral arteries are patent with normal antegrade flow. Signed, Criselda Peaches, MD [facility] Radiology Electronically Signed   By: Jacqulynn Cadet M.D.   On: 10/03/2015 11:10   Mr Jodene Nam Head/brain Wo Cm  10/03/2015  CLINICAL DATA:  History of multiple falls  and generalized weakness. EXAM: MRI HEAD WITHOUT CONTRAST MRA HEAD WITHOUT CONTRAST TECHNIQUE: Multiplanar, multiecho pulse sequences of the brain and surrounding structures were obtained without intravenous contrast. Angiographic images of the head were obtained using MRA technique without contrast. COMPARISON:  03/30/2015 brain MRI FINDINGS: MRI HEAD FINDINGS Calvarium and upper cervical spine: No focal marrow signal abnormality. Upper cervical facet arthropathy and disc degeneration with C3-4 and C4-5 canal stenosis. Orbits: Negative. Sinuses and Mastoids: Clear. Brain: 5 mm right subthalamic acute infarct, nonhemorrhagic. Remote lacunar infarcts in the right thalamus, deep cerebral white matter tracts, right caudate body, and right pons. Remote small to moderate cortical infarcts in the left occipital pole and high parasagittal left frontal lobe. Few foci of chronic microhemorrhage without specific pattern, likely post ischemic. No hemorrhage, hydrocephalus, mass lesion, or evidence of major vessel occlusion MRA HEAD FINDINGS Symmetric carotid and vertebral arteries. Symmetric and unremarkable vertebrobasilar branching. Small of any communicating arteries. Atherosclerotic  type irregularity of the bilateral carotid siphons, with moderate stenosis at the right anterior genu. The M1 and A1 segments are patent there is a high-grade distal left M1 segment stenosis. Focal apparent advanced narrowing of the lower branch right M2 vessel is likely combination of poor signal and atherosclerosis. No evidence of major branch occlusion. Smooth and widely patent vertebral, basilar, and proximal posterior cerebral arteries. Beginning at the P3 segments there is atherosclerotic irregularity and narrowing When accounting for infundibula at the ophthalmic origins no aneurysm is suspected. IMPRESSION: 1. 5 mm acute right subthalamic infarct. 2. Extensive chronic ischemic injury as described above. 3. No acute arterial finding. 4. Intracranial atherosclerosis with high-grade left distal M1 and right M2 stenoses. 5. 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 Electronically Signed   By: Monte Fantasia M.D.   On: 10/03/2015 12:37    Assessment: 75 y.o. male presenting with complaints of right sided weakness followed by left sided weakness.  Left sided weakness remains on examination although patient reports that his right side is weak as well.  MRI of the brain personally reviewed and shows a right subthalamic infarct.  MRA shows a high grade left distal M1 and right M2 stenosis.  Carotid dopplers are unremarkable. Echocardiogram is pending.  LDL 55.  A1c 6.4.   Cervical spine findings not likely contributing to current presentation and may be followed on an outpatient basis.    Stroke Risk Factors - diabetes mellitus and hypertension  Plan: 1. Echocardiogram pending 2. Prophylactic therapy-ASA 81 mg and Plavix 75 mg daily for 3 months.  Will make adjustments to antiplatelet therapy at that time as an outpatient. 3. Telemetry monitoring 4. Frequent neuro checks 5. PT, OT, Speech therapy evaluations 6. Follow up with neurology at discharge.      Alexis Goodell, MD Neurology [phone] 10/03/2015, 4:02 PM

## 2015-10-03 NOTE — H&P (Signed)
[facility] NAME: David Sloan    MR#:  [id number]  DATE OF BIRTH:  August 27, 1940  DATE OF ADMISSION:  10/02/2015  PRIMARY CARE PHYSICIAN: Harlow Ohms, MD   REQUESTING/REFERRING PHYSICIAN:   CHIEF COMPLAINT:   Chief Complaint  Patient presents with  . Weakness    HISTORY OF PRESENT ILLNESS: David Sloan  is a 75 y.o. male with a known history of CVA diabetes mellitus type 2, hypertension, diabetic neuropathy, benign prostatic hyperplasia, sleep apnea, osteoarthritis presented to the emergency room with increased weakness in the right side. Patient also lost balance and fell down in his den and bump his head. No history of loss of consciousness. No history of any seizure. No complaints of any chest pain. No history of any tingling or numbness in any part of the body. Patient was evaluated in the emergency room with CT head which showed no acute intracranial abnormality. Patient has generalized weakness and unable to stand and move around without any falls. He has been having frequent falls. No history of dysphagia. Hospitalist service was consulted for further care of the patient.  PAST MEDICAL HISTORY:   Past Medical History  Diagnosis Date  . Essential hypertension   . Diabetes mellitus without complication ([place])   . Stroke [facility])     a. ~ 2002 - no residual deficits;  b. 03/2015 Head CT: prior old infarcts and foci of small vesel dzs in pons bilat. ? bilat occipital lobe acute infarcts R>L.  . Diabetic neuropathy ([place])   . BPH (benign prostatic hyperplasia)   . Urinary incontinence   . Atypical chest pain     a. 04/2013 MV: nl EF, no ischemia.  . Osteoarthritis     a. s/p bilat knee surgeries.  . OSA (obstructive sleep apnea)   . Chronic back pain   . Sinus bradycardia   . Falls   . Memory disorder   . GERD (gastroesophageal reflux disease)     PAST SURGICAL HISTORY: Past Surgical History  Procedure Laterality Date   . Joint replacement      bilateral knee replacements    SOCIAL HISTORY:  Social History  Substance Use Topics  . Smoking status: Former Smoker -- 1.00 packs/day for 25 years    Types: Cigarettes  . Smokeless tobacco: Never Used  . Alcohol Use: No    FAMILY HISTORY:  Family History  Problem Relation Age of Onset  . Alzheimer's disease Mother   . Diabetes Father     DRUG ALLERGIES: No Known Allergies  REVIEW OF SYSTEMS:   CONSTITUTIONAL: No fever, has weakness.  EYES: No blurred or double vision.  EARS, NOSE, AND THROAT: No tinnitus or ear pain.  RESPIRATORY: No cough, shortness of breath, wheezing or hemoptysis.  CARDIOVASCULAR: No chest pain, orthopnea, edema.  GASTROINTESTINAL: No nausea, vomiting, diarrhea or abdominal pain.  GENITOURINARY: No dysuria, hematuria.  ENDOCRINE: No polyuria, nocturia,  HEMATOLOGY: No anemia, easy bruising or bleeding SKIN: No rash or lesion. MUSCULOSKELETAL: No joint pain or arthritis.   NEUROLOGIC: No tingling, numbness, has weakness on the right side. No slurred speech PSYCHIATRY: No anxiety or depression.   MEDICATIONS AT HOME:  Prior to Admission medications   Medication Sig Start Date End Date Taking? Authorizing Provider  amLODipine (NORVASC) 5 MG tablet Take 5 mg by mouth daily. 12/20/14 12/20/15 Yes Historical Provider, MD  atorvastatin (LIPITOR) 20 MG tablet Take 20 mg by mouth.   Yes Historical Provider,  MD  Blood Glucose Monitoring Suppl (RA BLOOD GLUCOSE MONITOR) DEVI Please provide ACCU-CHEK KIT 04/17/15  Yes Historical Provider, MD  cloNIDine (CATAPRES) 0.1 MG tablet Take 0.2 mg by mouth daily. 09/18/14 10/02/15 Yes Historical Provider, MD  dipyridamole-aspirin (AGGRENOX) 200-25 MG 12hr capsule Take 1 capsule by mouth 2 (two) times daily. 01/15/15 01/15/16 Yes Historical Provider, MD  insulin NPH Human (HUMULIN N) 100 UNIT/ML injection Inject 30-35 Units into the skin 2 (two) times daily. 35 units before breakfast and 30 units before  supper. 01/15/15  Yes Historical Provider, MD  losartan (COZAAR) 100 MG tablet Take 100 mg by mouth daily. 12/20/14 12/20/15 Yes Historical Provider, MD  oxybutynin (DITROPAN-XL) 5 MG 24 hr tablet Take 5 mg by mouth at bedtime.   Yes Historical Provider, MD  oxyCODONE-acetaminophen (PERCOCET) 7.5-325 MG tablet Take 1 tablet by mouth every 6 (six) hours as needed for moderate pain.  04/13/15  Yes Historical Provider, MD  pantoprazole (PROTONIX) 40 MG tablet Take 40 mg by mouth 2 (two) times daily. 01/15/15 01/15/16 Yes Historical Provider, MD  tamsulosin (FLOMAX) 0.4 MG CAPS capsule Take 0.4 mg by mouth daily. 01/15/15 01/15/16 Yes Historical Provider, MD      PHYSICAL EXAMINATION:   VITAL SIGNS: Blood pressure 146/60, pulse 45, temperature 98.2 F (36.8 C), temperature source Oral, resp. rate 12, height '5\' 6"'  (1.676 m), weight 86.183 kg (190 lb), SpO2 96 %.  GENERAL:  75 y.o.-year-old patient lying in the bed with no acute distress.  EYES: Pupils equal, round, reactive to light and accommodation. No scleral icterus. Extraocular muscles intact.  HEENT: Head atraumatic, normocephalic. Oropharynx and nasopharynx clear.  NECK:  Supple, no jugular venous distention. No thyroid enlargement, no tenderness.  LUNGS: Normal breath sounds bilaterally, no wheezing, rales,rhonchi or crepitation. No use of accessory muscles of respiration.  CARDIOVASCULAR: S1, S2 normal. No murmurs, rubs, or gallops.  ABDOMEN: Soft, nontender, nondistended. Bowel sounds present. No organomegaly or mass.  EXTREMITIES: No pedal edema, cyanosis, or clubbing.  NEUROLOGIC: Cranial nerves II through XII are intact. Muscle strength 4/5 in right upper and right lower extremities.Power 5/5 left upper and left lower extremities. Sensation intact. Gait not checked  PSYCHIATRIC: The patient is alert and oriented x 3.  SKIN: No obvious rash, lesion, or ulcer.   LABORATORY PANEL:   CBC  Recent Labs Lab 10/02/15 1621  WBC 5.8  HGB 12.4*   HCT 36.0*  PLT 182  MCV 90.1  MCH 31.1  MCHC 34.5  RDW 13.3  LYMPHSABS 1.5  MONOABS 0.5  EOSABS 0.1  BASOSABS 0.0   ------------------------------------------------------------------------------------------------------------------  Chemistries   Recent Labs Lab 10/02/15 1621  NA 142  K 3.4*  CL 106  CO2 29  GLUCOSE 119*  BUN 16  CREATININE 0.90  CALCIUM 9.8  AST 31  ALT 24  ALKPHOS 58  BILITOT 0.8   ------------------------------------------------------------------------------------------------------------------ estimated creatinine clearance is 75.3 mL/min (by C-G formula based on Cr of 0.9). ------------------------------------------------------------------------------------------------------------------ No results for input(s): TSH, T4TOTAL, T3FREE, THYROIDAB in the last 72 hours.  Invalid input(s): FREET3   Coagulation profile No results for input(s): INR, PROTIME in the last 168 hours. ------------------------------------------------------------------------------------------------------------------- No results for input(s): DDIMER in the last 72 hours. -------------------------------------------------------------------------------------------------------------------  Cardiac Enzymes  Recent Labs Lab 10/02/15 1621  TROPONINI <0.03   ------------------------------------------------------------------------------------------------------------------ Invalid input(s): POCBNP  ---------------------------------------------------------------------------------------------------------------  Urinalysis    Component Value Date/Time   COLORURINE YELLOW* 10/02/2015 1900   APPEARANCEUR CLEAR* 10/02/2015 1900   LABSPEC 1.025 10/02/2015 1900   PHURINE  5.0 10/02/2015 1900   GLUCOSEU NEGATIVE 10/02/2015 1900   HGBUR NEGATIVE 10/02/2015 1900   BILIRUBINUR NEGATIVE 10/02/2015 1900   KETONESUR TRACE* 10/02/2015 1900   PROTEINUR 30* 10/02/2015 1900   NITRITE  NEGATIVE 10/02/2015 1900   LEUKOCYTESUR NEGATIVE 10/02/2015 1900     RADIOLOGY: Ct Head Wo Contrast  10/02/2015  CLINICAL DATA:  Left-sided weakness. EXAM: CT HEAD WITHOUT CONTRAST TECHNIQUE: Contiguous axial images were obtained from the base of the skull through the vertex without intravenous contrast. COMPARISON:  CT scan of March 30, 2015. FINDINGS: Bony calvarium appears intact. Mild chronic ischemic white matter disease is noted. Left frontal encephalomalacia is noted consistent with old infarction. Left occipital encephalomalacia is also noted consistent with old infarction. No mass effect or midline shift is noted. Ventricular size is within normal limits. There is no evidence of mass lesion, hemorrhage or acute infarction. IMPRESSION: Mild chronic ischemic white matter disease. 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 No acute intracranial abnormality seen. Electronically Signed   By: Marijo Conception, M.D.   On: 10/02/2015 17:03    EKG: Orders placed or performed during the hospital encounter of 10/02/15  . ED EKG  . ED EKG  . EKG 12-Lead  . EKG 12-Lead    IMPRESSION AND PLAN: 75 year old male patient with history of CVA, arthritis, hypertension, diabetes mellitus, benign prostatic hypertrophy presented to the emergency room with right-sided weakness. Patient also lost balance and had a fall. Admitting diagnosis 1. Right-sided weakness rule out new onset CVA 2. Gait instability 3. Accidental fall 4. Mild hypokalemia 5 history of CVA Treatment plan Admit patient to medical floor MRI MRA brain to rule out CVA Carotid ultrasound to rule out obstruction Resume Aggrenox at home dose Replace potassium Physical therapy evaluation Resume home antihypertensive medication Supportive care.  All the records are reviewed and case discussed with ED provider. Management plans discussed with the patient, family and they are in agreement.  CODE STATUS:FULL Code Status History     Date Active Date Inactive Code Status Order ID Comments User Context   03/30/2015 11:29 AM 03/31/2015  8:13 PM Full Code [id number]  Henreitta Leber, MD Inpatient       TOTAL TIME TAKING CARE OF THIS PATIENT: 50 minutes.    Saundra Shelling M.D on 10/03/2015 at 12:19 AM  Between 7am to 6pm - Pager - [phone]  After 6pm go to [email or web address] - password [email or web address] [facility]  Office  [phone]  CC: Primary care physician; Harlow Ohms, MD

## 2015-10-03 NOTE — Clinical Social Work Placement (Signed)
   CLINICAL SOCIAL WORK PLACEMENT  NOTE  Date:  10/03/2015  Patient Details  Name: David Sloan MRN: [id number] Date of Birth: 08/29/1940  Clinical Social Work is seeking post-discharge placement for this patient at the Skilled  Nursing Facility level of care (*CSW will initial, date and re-position this form in  chart as items are completed):  Yes   Patient/family provided with [place] Clinical Social Work Department's list of facilities offering this level of care within the geographic area requested by the patient (or if unable, by the patient's family).  Yes   Patient/family informed of their freedom to choose among providers that offer the needed level of care, that participate in Medicare, Medicaid or managed care program needed by the patient, have an available bed and are willing to accept the patient.  Yes   Patient/family informed of [place]'s ownership interest in [facility] and [facility], as well as of the fact that they are under no obligation to receive care at these facilities.  PASRR submitted to EDS on 10/03/15     PASRR number received on 10/03/15     Existing PASRR number confirmed on       FL2 transmitted to all facilities in geographic area requested by pt/family on 10/03/15     FL2 transmitted to all facilities within larger geographic area on       Patient informed that his/her managed care company has contracts with or will negotiate with certain facilities, including the following:            Patient/family informed of bed offers received.  Patient chooses bed at       Physician recommends and patient chooses bed at      Patient to be transferred to   on  .  Patient to be transferred to facility by       Patient family notified on   of transfer.  Name of family member notified:        PHYSICIAN       Additional Comment:    _______________________________________________ Dede QuerySarah Tallie Hevia, LCSW 10/03/2015, 12:08 PM

## 2015-10-03 NOTE — Evaluation (Signed)
Physical Therapy Evaluation Patient Details Name: SOVEREIGN RAMIRO MRN: [id number] DOB: 11/16/1940 Today's Date: 10/03/2015   History of Present Illness  Maxine Huynh is a 75 y.o. male with a known history of CVA diabetes mellitus type 2, hypertension, diabetic neuropathy, benign prostatic hyperplasia, sleep apnea, osteoarthritis presented to the emergency room with increased weakness in the right side. Patient also lost balance and fell down in his den and bump his head. No history of loss of consciousness. No history of any seizure. No complaints of any chest pain. No history of any tingling or numbness in any part of the body. Patient was evaluated in the emergency room with CT head which showed no acute intracranial abnormality. Patient has generalized weakness and unable to stand and move around without any falls. He has been having frequent falls. No history of dysphagia. Hospitalist service was consulted for further care of the patient. During PT evaluation pt actually reports that it is his left side which has been weak, primarily his LLE. He denies R sided weakness. Pt reports 6-8 falls in the last 12 months. Pt reports history of prior CVA however states that L sided weakness is new.  Clinical Impression  Pt presents with LLE weakness noted with L hip flexion and in standing with gait. History indicates that he was complaining of R side weakness in ER however pt refutes this stating that it has always been his L side that was weak. He struggles to advance LLE with attempted ambulation and is very unstable and unsafe requiring +2 assist. Pt also requiring assist for bed mobility and transfers. No rigidity, spasticity, clonus, or foot drop noted in LLE. LUE strength appears grossly WFL and symmetrical to R side. Pt denies numbness/tingling throughout. Pt is AOx4 but does appear to take extended time to answer some questions regarding his symptoms. Pt will need SNF placement at discharge. Pt will  benefit from skilled PT services to address deficits in strength, balance, and mobility in order to return to full function at home.     Follow Up Recommendations SNF    Equipment Recommendations  None recommended by PT    Recommendations for Other Services       Precautions / Restrictions Precautions Precautions: Fall Restrictions Weight Bearing Restrictions: No      Mobility  Bed Mobility Overal bed mobility: Needs Assistance Bed Mobility: Supine to Sit;Sit to Supine     Supine to sit: Mod assist Sit to supine: Min assist   General bed mobility comments: Pt with difficulty flexing L hip during bed mobility. Also demonstrates truncal weakness requiring assist to come to sitting  Transfers Overall transfer level: Needs assistance Equipment used: Rolling walker (2 wheeled) Transfers: Sit to/from Stand Sit to Stand: Min assist         General transfer comment: Pt with poor anterior weigth shifting and decreased LE power. Requires extended time to come to standing with cues for proper hand placement. Maintains crouched gait posture with cues for full hip and knee extension in standing   Ambulation/Gait Ambulation/Gait assistance: Min assist;+2 physical assistance Ambulation Distance (Feet): 10 Feet (5+5) Assistive device: Rolling walker (2 wheeled) Gait Pattern/deviations: Decreased step length - left;Decreased stance time - left;Decreased dorsiflexion - left;Decreased weight shift to left;Step-to pattern Gait velocity: Decreased Gait velocity interpretation: <1.8 ft/sec, indicative of risk for recurrent falls General Gait Details: Pt ambulates with rolling walker. Cues for proper advancement of walker and hand placement. Pt has significant difficulty advancing LLE during gait. He  has trouble initiating motion and bears weight completely through forefoot. Pt demonstrates adequate L DF strength in sitting but functionally he does not dorsiflex during gait. Poor balance and  +2 required for safety due to poor stability with gait  Stairs            Wheelchair Mobility    Modified Rankin (Stroke Patients Only)       Balance Overall balance assessment: Needs assistance Sitting-balance support: No upper extremity supported Sitting balance-Leahy Scale: Fair Sitting balance - Comments: Pt only tolerates minimal challenge to balance with external perturbations in sitting   Standing balance support: Bilateral upper extremity supported Standing balance-Leahy Scale: Poor Standing balance comment: Unable to balance in standing without UE support                             Pertinent Vitals/Pain Pain Assessment: No/denies pain    Home Living Family/patient expects to be discharged to:: Private residence Living Arrangements: Spouse/significant other Available Help at Discharge: Family Type of Home: House Home Access: Stairs to enter Entrance Stairs-Rails: Can reach both Entrance Stairs-Number of Steps: 1 Home Layout: One level Home Equipment: Cane - single point;Walker - 2 wheels;Bedside commode;Shower seat;Grab bars - toilet (no wheelchair or hospital bed)      Prior Function Level of Independence: Independent with assistive device(s)         Comments: Patient has been using a SPC vs RW for limited community mobility. Reports he drives. Uses motorized wheelchair for long distance community      Hand Dominance   Dominant Hand: Right    Extremity/Trunk Assessment   Upper Extremity Assessment: Overall WFL for tasks assessed (4-/5 bilateral shoulder flexion, no focal weakness)           Lower Extremity Assessment: LLE deficits/detail   LLE Deficits / Details: RLE grossly 4 to 4+/5. L hip flexion 4-/5. L knee flexion/extension and ankle dorsiflexion 4+/5. Functionally pt with difficulty advancing LLE during gait. Difficulty with dorsiflexion and stepping. No clonus, spasticity, or rigidity noted in LLE. Pt reports fully intact  sensation to light touch throughout LLE     Communication   Communication: No difficulties  Cognition Arousal/Alertness: Awake/alert Behavior During Therapy: WFL for tasks assessed/performed Overall Cognitive Status: No family/caregiver present to determine baseline cognitive functioning (AOx4)       Memory: Decreased short-term memory (Takes extended time for questions about president, etc)              General Comments      Exercises General Exercises - Lower Extremity Long Arc Quad: Strengthening;Both;10 reps;Seated Hip ABduction/ADduction: Strengthening;Both;10 reps;Seated Hip Flexion/Marching: Strengthening;Both;10 reps;Seated Heel Raises: Strengthening;Both;10 reps;Seated      Assessment/Plan    PT Assessment Patient needs continued PT services  PT Diagnosis Difficulty walking;Abnormality of gait;Generalized weakness   PT Problem List Decreased strength;Decreased activity tolerance;Decreased balance;Decreased mobility;Decreased safety awareness  PT Treatment Interventions DME instruction;Gait training;Stair training;Functional mobility training;Therapeutic activities;Therapeutic exercise;Balance training;Neuromuscular re-education;Patient/family education   PT Goals (Current goals can be found in the Care Plan section) Acute Rehab PT Goals Patient Stated Goal: Return to prior level of function PT Goal Formulation: With patient Time For Goal Achievement: 10/17/15 Potential to Achieve Goals: Good    Frequency 7X/week   Barriers to discharge        Co-evaluation               End of Session Equipment Utilized During Treatment: Gait belt  Activity Tolerance: Patient tolerated treatment well Patient left: in bed;with call bell/phone within reach;with bed alarm set Nurse Communication: Other (comment) (Ensured mobility on board correct)    Functional Assessment Tool Used: clinical judgement Functional Limitation: Mobility: Walking and moving  around Mobility: Walking and Moving Around Current Status ([id number]([id number]): At least 80 percent but less than 100 percent impaired, limited or restricted Mobility: Walking and Moving Around Goal Status [phone]([id number]): At least 40 percent but less than 60 percent impaired, limited or restricted    Time: 0915-0948 PT Time Calculation (min) (ACUTE ONLY): 33 min   Charges:   PT Evaluation $PT Eval Moderate Complexity: 1 Procedure PT Treatments $Therapeutic Exercise: 8-22 mins   PT G Codes:   PT G-Codes **NOT FOR INPATIENT CLASS** Functional Assessment Tool Used: clinical judgement Functional Limitation: Mobility: Walking and moving around Mobility: Walking and Moving Around Current Status ([id number]([id number]): At least 80 percent but less than 100 percent impaired, limited or restricted Mobility: Walking and Moving Around Goal Status [phone]([id number]): At least 40 percent but less than 60 percent impaired, limited or restricted   Lynnea MaizesJason D Josefine Fuhr PT, DPT   Brown [place] 10/03/2015, 10:43 AM

## 2015-10-03 NOTE — Progress Notes (Signed)
Patient ID: David Sloan, male   DOB: 11/23/1940, 75 y.o.   MRN: [id number] David Sloan  David Sloan OZD:[id number] DOB: [birth year] DOA: 10/02/2015 PCP: David Leys, MD  HPI/Subjective: Patient is very weak and has been falling. He states he has left-sided weakness which is new. No complaints of any neck pain or trouble moving his neck.  Objective: Filed Vitals:   10/03/15 0938 10/03/15 1221  BP: 153/59 165/60  Pulse: 44 40  Temp: 97.5 F (36.4 C) 97.5 F (36.4 C)  Resp:      Filed Weights   10/02/15 1612 10/03/15 0155  Weight: 86.183 kg (190 lb) 82.101 kg (181 lb)    ROS: Review of Systems  Constitutional: Negative for fever and chills.  Eyes: Negative for blurred vision.  Respiratory: Negative for cough and shortness of breath.   Cardiovascular: Negative for chest pain.  Gastrointestinal: Negative for nausea, vomiting, abdominal pain, diarrhea and constipation.  Genitourinary: Negative for dysuria.  Musculoskeletal: Negative for joint pain.  Neurological: Positive for focal weakness and weakness. Negative for dizziness and headaches.   Exam: Physical Exam  Constitutional: He is oriented to person, place, and time.  HENT:  Nose: No mucosal edema.  Mouth/Throat: No oropharyngeal exudate or posterior oropharyngeal edema.  Eyes: Conjunctivae, EOM and lids are normal. Pupils are equal, round, and reactive to light.  Neck: No JVD present. Carotid bruit is not present. No edema present. No thyroid mass and no thyromegaly present.  Good range of motion of neck.  Cardiovascular: S1 normal and S2 normal.  Exam reveals no gallop.   No murmur heard. Pulses:      Dorsalis pedis pulses are 2+ on the right side, and 2+ on the left side.  Respiratory: No respiratory distress. He has no wheezes. He has no rhonchi. He has no rales.  GI: Soft. Bowel sounds are normal. There is no tenderness.  Musculoskeletal:       Right ankle: He exhibits swelling.       Left  ankle: He exhibits swelling.  Lymphadenopathy:    He has no cervical adenopathy.  Neurological: He is alert and oriented to person, place, and time. No cranial nerve deficit.  Left lower extremity 4 out of 5 power. Left upper extremity 4+ out of 5 power but incoordination of the left hand. Right sided 4+ out of 5 power.  Skin: Skin is warm. No rash noted. Nails show no clubbing.  Psychiatric: He has a normal mood and affect.      Data Reviewed: Basic Metabolic Panel:  Recent Labs Lab 10/02/15 1621 10/03/15 0601  NA 142  --   K 3.4*  --   CL 106  --   CO2 29  --   GLUCOSE 119*  --   BUN 16  --   CREATININE 0.90 0.95  CALCIUM 9.8  --    Liver Function Tests:  Recent Labs Lab 10/02/15 1621  AST 31  ALT 24  ALKPHOS 58  BILITOT 0.8  PROT 7.0  ALBUMIN 4.0   CBC:  Recent Labs Lab 10/02/15 1621 10/03/15 0601  WBC 5.8 4.7  NEUTROABS 3.7  --   HGB 12.4* 11.7*  HCT 36.0* 33.9*  MCV 90.1 90.5  PLT 182 149*    CBG:  Recent Labs Lab 10/02/15 2146 10/03/15 0816 10/03/15 1224  GLUCAP 114* 130* 172*     Studies: Ct Head Wo Contrast  10/02/2015  CLINICAL DATA:  Left-sided weakness. EXAM: CT HEAD WITHOUT CONTRAST  TECHNIQUE: Contiguous axial images were obtained from the base of the skull through the vertex without intravenous contrast. COMPARISON:  CT scan of March 30, 2015. FINDINGS: Bony calvarium appears intact. Mild chronic ischemic white matter disease is noted. Left frontal encephalomalacia is noted consistent with old infarction. Left occipital encephalomalacia is also noted consistent with old infarction. No mass effect or midline shift is noted. Ventricular size is within normal limits. There is no evidence of mass lesion, hemorrhage or acute infarction. IMPRESSION: Mild chronic ischemic white matter disease. 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 No acute intracranial abnormality seen. Electronically Signed   By: David Sloan, M.D.   On: 10/02/2015  17:03   Mr Brain Wo Contrast  10/03/2015  CLINICAL DATA:  History of multiple falls and generalized weakness. EXAM: MRI HEAD WITHOUT CONTRAST MRA HEAD WITHOUT CONTRAST TECHNIQUE: Multiplanar, multiecho pulse sequences of the brain and surrounding structures were obtained without intravenous contrast. Angiographic images of the head were obtained using MRA technique without contrast. COMPARISON:  03/30/2015 brain MRI FINDINGS: MRI HEAD FINDINGS Calvarium and upper cervical spine: No focal marrow signal abnormality. Upper cervical facet arthropathy and disc degeneration with C3-4 and C4-5 canal stenosis. Orbits: Negative. Sinuses and Mastoids: Clear. Brain: 5 mm right subthalamic acute infarct, nonhemorrhagic. Remote lacunar infarcts in the right thalamus, deep cerebral white matter tracts, right caudate body, and right pons. Remote small to moderate cortical infarcts in the left occipital pole and high parasagittal left frontal lobe. Few foci of chronic microhemorrhage without specific pattern, likely post ischemic. No hemorrhage, hydrocephalus, mass lesion, or evidence of major vessel occlusion MRA HEAD FINDINGS Symmetric carotid and vertebral arteries. Symmetric and unremarkable vertebrobasilar branching. Small of any communicating arteries. Atherosclerotic type irregularity of the bilateral carotid siphons, with moderate stenosis at the right anterior genu. The M1 and A1 segments are patent there is a high-grade distal left M1 segment stenosis. Focal apparent advanced narrowing of the lower branch right M2 vessel is likely combination of poor signal and atherosclerosis. No evidence of major branch occlusion. Smooth and widely patent vertebral, basilar, and proximal posterior cerebral arteries. Beginning at the P3 segments there is atherosclerotic irregularity and narrowing When accounting for infundibula at the ophthalmic origins no aneurysm is suspected. IMPRESSION: 1. 5 mm acute right subthalamic infarct. 2.  Extensive chronic ischemic injury as described above. 3. No acute arterial finding. 4. Intracranial atherosclerosis with high-grade left distal M1 and right M2 stenoses. 5. 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 Electronically Signed   By: Marnee Spring M.D.   On: 10/03/2015 12:37   US Carotid Bilateral  10/03/2015  CLINICAL DATA:  75 year old male with symptoms of transient ischemic attack EXAM: BILATERAL CAROTID DUPLEX ULTRASOUND TECHNIQUE: Wallace Cullens scale imaging, color Doppler and duplex ultrasound were performed of bilateral carotid and vertebral arteries in the neck. COMPARISON:  Prior duplex carotid ultrasound 03/30/2015 ; prior CTA neck 03/30/2015 FINDINGS: Criteria: Quantification of carotid stenosis is based on velocity parameters that correlate the residual internal carotid diameter with NASCET-based stenosis levels, using the diameter of the distal internal carotid lumen as the denominator for stenosis measurement. The following velocity measurements were obtained: RIGHT ICA:  60/8 cm/sec CCA:  97/8 cm/sec SYSTOLIC ICA/CCA RATIO:  0.6 DIASTOLIC ICA/CCA RATIO:  1.7 ECA:  149 cm/sec LEFT ICA:  84/12 cm/sec CCA:  75/5 cm/sec SYSTOLIC ICA/CCA RATIO:  1.1 DIASTOLIC ICA/CCA RATIO:  1.7 ECA:  85 cm/sec RIGHT CAROTID ARTERY: Stable appearance of mild smooth heterogeneous atherosclerotic  plaque in the carotid bifurcation and proximal internal carotid artery. By peak systolic velocity criteria the estimated stenosis remains less than 50%. RIGHT VERTEBRAL ARTERY:  Patent with normal antegrade flow. LEFT CAROTID ARTERY: Stable appearance of mild heterogeneous but smooth atherosclerotic plaque in the proximal internal carotid artery. By peak systolic velocity criteria the estimated stenosis remains less than 50%. LEFT VERTEBRAL ARTERY:  Patent with normal antegrade flow. IMPRESSION: 1. Mild (1-49%) stenosis proximal right internal carotid artery secondary to smooth heterogeneous  atherosclerotic plaque. 2. Mild (1-49%) stenosis proximal left internal carotid artery secondary to smooth, heterogeneous atherosclerotic plaque. 3. Vertebral arteries are patent with normal antegrade flow. Signed, Sterling BigHeath K. McCullough, MD [facility] Electronically Signed   By: Malachy MoanHeath  McCullough M.D.   On: 10/03/2015 11:10   Mr Maxine GlennMra Head/brain Wo Cm  10/03/2015  CLINICAL DATA:  History of multiple falls and generalized weakness. EXAM: MRI HEAD WITHOUT CONTRAST MRA HEAD WITHOUT CONTRAST TECHNIQUE: Multiplanar, multiecho pulse sequences of the brain and surrounding structures were obtained without intravenous contrast. Angiographic images of the head were obtained using MRA technique without contrast. COMPARISON:  03/30/2015 brain MRI FINDINGS: MRI HEAD FINDINGS Calvarium and upper cervical spine: No focal marrow signal abnormality. Upper cervical facet arthropathy and disc degeneration with C3-4 and C4-5 canal stenosis. Orbits: Negative. Sinuses and Mastoids: Clear. Brain: 5 mm right subthalamic acute infarct, nonhemorrhagic. Remote lacunar infarcts in the right thalamus, deep cerebral white matter tracts, right caudate body, and right pons. Remote small to moderate cortical infarcts in the left occipital pole and high parasagittal left frontal lobe. Few foci of chronic microhemorrhage without specific pattern, likely post ischemic. No hemorrhage, hydrocephalus, mass lesion, or evidence of major vessel occlusion MRA HEAD FINDINGS Symmetric carotid and vertebral arteries. Symmetric and unremarkable vertebrobasilar branching. Small of any communicating arteries. Atherosclerotic type irregularity of the bilateral carotid siphons, with moderate stenosis at the right anterior genu. The M1 and A1 segments are patent there is a high-grade distal left M1 segment stenosis. Focal apparent advanced narrowing of the lower branch right M2 vessel is likely combination of  poor signal and atherosclerosis. No evidence of major branch occlusion. Smooth and widely patent vertebral, basilar, and proximal posterior cerebral arteries. Beginning at the P3 segments there is atherosclerotic irregularity and narrowing When accounting for infundibula at the ophthalmic origins no aneurysm is suspected. IMPRESSION: 1. 5 mm acute right subthalamic infarct. 2. Extensive chronic ischemic injury as described above. 3. No acute arterial finding. 4. Intracranial atherosclerosis with high-grade left distal M1 and right M2 stenoses. 5. 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 Electronically Signed   By: Marnee SpringJonathon  Watts M.D.   On: 10/03/2015 12:37    Scheduled Meds: . atorvastatin  40 mg Oral q1800  . [START ON 10/04/2015] cloNIDine  0.1 mg Oral Daily  . clopidogrel  75 mg Oral Daily  . enoxaparin (LOVENOX) injection  40 mg Subcutaneous Q24H  . insulin aspart  0-9 Units Subcutaneous TID WC  . insulin detemir  30 Units Subcutaneous QAC supper  . insulin detemir  35 Units Subcutaneous QAC breakfast  . losartan  100 mg Oral Daily  . oxybutynin  5 mg Oral QHS  . pantoprazole  40 mg Oral BID  . tamsulosin  0.4 mg Oral Daily    Assessment/Plan:  1. 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 Patient switched to inpatient. Take a step up and treatment to Plavix. Increase statin to 40 mg  daily. Case discussed with neurology. Physical therapy recommended rehabilitation. 2. MRI of the brain commented on cervical spine which showed canal stenosis. Patient is asymptomatic with regards to that moving his neck around too well. I do not think this is symptomatic. Neurology to evaluate. 3. Bradycardia. Taper clonidine to 0.1 mg daily. I probably will have to taper off clonidine altogether. 4. Essential hypertension allow permissive hypertension at this point. Likely will have to go back on the Norvasc at higher dose. 5. BPH without urinary  obstruction. Continue Flomax 6. Type 2 diabetes mellitus on sliding scale insulin and Levemir.  Code Status:     Code Status Orders        Start     Ordered   10/03/15 0157  Full code   Continuous     10/03/15 0156    Code Status History    Date Active Date Inactive Code Status Order ID Comments User Context   03/30/2015 11:29 AM 03/31/2015  8:13 PM Full Code [id number]  Houston Siren, MD Inpatient     Family Communication: Spoke with daughter-in-law Wilmon Arms Disposition Plan: To rehabilitation soon  Consultants:  Neurology  Time spent: 35 minutes  Alford Highland  [employer]

## 2015-10-03 NOTE — Progress Notes (Signed)
Spoke with Dr. Hilton SinclairWeiting, made him aware pt has no order for CBG checks.  Per Dr. Hilton SinclairWeiting, order insulin sliding scale order set.  Order placed.  Orson Apeanielle Mayara Paulson, RN

## 2015-10-04 LAB — GLUCOSE, CAPILLARY
GLUCOSE-CAPILLARY: 76 mg/dL (ref 65–99)
Glucose-Capillary: 134 mg/dL — ABNORMAL HIGH (ref 65–99)

## 2015-10-04 MED ORDER — CLOPIDOGREL BISULFATE 75 MG PO TABS: 75.0000 mg | ORAL_TABLET | Freq: Every day | ORAL | Status: DC

## 2015-10-04 MED ORDER — AMLODIPINE BESYLATE 10 MG PO TABS: 10.0000 mg | ORAL_TABLET | Freq: Every day | ORAL | Status: DC

## 2015-10-04 MED ORDER — INSULIN DETEMIR 100 UNIT/ML ~~LOC~~ SOLN
25.0000 [IU] | Freq: Every day | SUBCUTANEOUS | Status: DC
  Filled 2015-10-04: qty 0.25

## 2015-10-04 MED ORDER — AMLODIPINE BESYLATE 10 MG PO TABS
10.0000 mg | ORAL_TABLET | Freq: Every day | ORAL | Status: DC
  Administered 2015-10-04: 09:00:00 10 mg via ORAL
  Filled 2015-10-04: qty 1

## 2015-10-04 MED ORDER — CLONIDINE HCL 0.1 MG PO TABS: 0.1000 mg | ORAL_TABLET | Freq: Every day | ORAL | Status: DC

## 2015-10-04 MED ORDER — OXYCODONE-ACETAMINOPHEN 7.5-325 MG PO TABS: 1.0000 | ORAL_TABLET | Freq: Four times a day (QID) | ORAL | Status: DC | PRN

## 2015-10-04 MED ORDER — ASPIRIN 81 MG PO CHEW: 81.0000 mg | CHEWABLE_TABLET | Freq: Every day | ORAL | Status: DC

## 2015-10-04 MED ORDER — ATORVASTATIN CALCIUM 40 MG PO TABS: 40.0000 mg | ORAL_TABLET | Freq: Every day | ORAL | Status: DC

## 2015-10-04 MED ORDER — INSULIN NPH (HUMAN) (ISOPHANE) 100 UNIT/ML ~~LOC~~ SUSP: SUBCUTANEOUS | Status: DC

## 2015-10-04 NOTE — Progress Notes (Signed)
EMS called for transport. Julyanna Scholle S, RN  

## 2015-10-04 NOTE — Progress Notes (Signed)
Occupational Therapy Evaluation Patient Details Name: David Sloan MRN: [id number] DOB: December 27, 1940 Today's Date: 10/04/2015    History of Present Illness David Sloan is a 75 y.o. male with a known history of CVA diabetes mellitus type 2, hypertension, diabetic neuropathy, benign prostatic hyperplasia, sleep apnea, osteoarthritis presented to the emergency room with increased weakness in the right side. Patient also lost balance and fell down in his den and bump his head. No history of loss of consciousness. No history of any seizure. No complaints of any chest pain. No history of any tingling or numbness in any part of the body. Patient was evaluated in the emergency room with CT head which showed no acute intracranial abnormality. Patient has generalized weakness and unable to stand and move around without any falls. He has been having frequent falls. No history of dysphagia. Hospitalist service was consulted for further care of the patient. During PT evaluation pt actually reports that it is his left side which has been weak, primarily his LLE. He denies R sided weakness. Pt reports 6-8 falls in the last 12 months. Pt reports history of prior CVA however states that L sided weakness is new.   Clinical Impression   Pt. Is a 75 y.o. male who was admitted to [facility] with a history of falls, and CVA. Pt. presents with weakness, and  limited activity tolerance, and impaired safety awareness which hinders her ability to complete ADLs and IADLs. Pt. Could benefit from skilled OT services to work on improving ADL tasks in order to return to his PLOF at his previous living environment. Pt. Plans to go to SNF level of care.    Follow Up Recommendations  SNF    Equipment Recommendations       Recommendations for Other Services       Precautions / Restrictions        Mobility Bed Mobility Overal bed mobility: Needs Assistance Bed Mobility: Supine to Sit     Supine to sit: Min assist Sit to  supine: Min assist      Transfers Overall transfer level: Needs assistance     Sit to Stand: Min assist to BSCommode. Pt. Requires cues for direction.              Balance Overall balance assessment: Needs assistance   Sitting balance-Leahy Scale: Fair       Standing balance-Leahy Scale: Poor                              ADL Overall ADL's : Needs assistance/impaired Eating/Feeding: Set up   Grooming: Minimal assistance;Set up           Upper Body Dressing : Minimal assistance (gown)   Lower Body Dressing: Maximal assistance   Toilet Transfer: Minimal assistance                   Vision     Perception     Praxis      Pertinent Vitals/Pain Pain Assessment: No/denies pain Pain Score: 0-No pain     Hand Dominance Right   Extremity/Trunk Assessment Upper Extremity Assessment Upper Extremity Assessment: Overall WFL for tasks assessed           Communication Communication Communication: No difficulties   Cognition Arousal/Alertness: Awake/alert Behavior During Therapy: WFL for tasks assessed/performed Overall Cognitive Status: No family/caregiver present to determine baseline cognitive functioning  General Comments       Exercises       Shoulder Instructions      Home Living Family/patient expects to be discharged to:: Private residence   Available Help at Discharge: Family Type of Home: House Home Access: Stairs to enter Secretary/administratorntrance Stairs-Number of Steps: 1 Entrance Stairs-Rails: Can reach both Home Layout: One level     Bathroom Shower/Tub: Tub/shower unit         Home Equipment: Cane - single point;Walker - 2 wheels;Bedside commode;Shower seat;Grab bars - toilet          Prior Functioning/Environment Level of Independence: Independent with assistive device(s)             OT Diagnosis:     OT Problem List:     OT Treatment/Interventions:      OT Goals(Current goals  can be found in the care plan section)    OT Frequency: Min 1X/week   Barriers to D/C:            Co-evaluation              End of Session Equipment Utilized During Treatment: Gait belt  Activity Tolerance: Patient tolerated treatment well Patient left: in bed;with call bell/phone within reach;with bed alarm set   Time: 1025-1055 OT Time Calculation (min): 30 min Charges:  OT General Charges $OT Visit: 1 Procedure OT Evaluation $OT Eval Moderate Complexity: 1 Procedure OT Treatments $Self Care/Home Management : 8-22 mins G-Codes:    Olegario MessierElaine Sary Bogie, MS, OTR/L Olegario MessierElaine Tierrah Anastos 10/04/2015, 12:18 PM

## 2015-10-04 NOTE — Clinical Social Work Note (Signed)
Pt is ready for discharge to [facility] (pt's first choice). CSW updated facility, and they are ready to admit pt. Pt is aware and agreeable to discharge plan. CSW called wife to update her as well. RN to call report and EMS will provide transportation. CSW is signing off as no further needs identified.   Dede QuerySarah Kastin Cerda, MSW, LCSW Clinical Social Worker  [phone]

## 2015-10-04 NOTE — Discharge Summary (Signed)
[facility] NAME: David Sloan    MR#:  [id number]  DATE OF BIRTH:  August 12, 1940  DATE OF ADMISSION:  10/02/2015 ADMITTING PHYSICIAN: Saundra Shelling, MD  DATE OF DISCHARGE: 10/03/2015  PRIMARY CARE PHYSICIAN: Harlow Ohms, MD    ADMISSION DIAGNOSIS:  TIA (transient ischemic attack) [G45.9] Left-sided weakness [M62.89] Cerebral infarction due to unspecified mechanism [I63.9]  DISCHARGE DIAGNOSIS:  Active Problems:   Fall   Gait instability   TIA (transient ischemic attack)   Acute CVA (cerebrovascular accident) ([place])   CVA (cerebral infarction)   SECONDARY DIAGNOSIS:   Past Medical History  Diagnosis Date  . Essential hypertension   . Diabetes mellitus without complication ([place])   . Stroke [facility])     a. ~ 2002 - no residual deficits;  b. 03/2015 Head CT: prior old infarcts and foci of small vesel dzs in pons bilat. ? bilat occipital lobe acute infarcts R>L.  . Diabetic neuropathy ([place])   . BPH (benign prostatic hyperplasia)   . Urinary incontinence   . Atypical chest pain     a. 04/2013 MV: nl EF, no ischemia.  . Osteoarthritis     a. s/p bilat knee surgeries.  . OSA (obstructive sleep apnea)   . Chronic back pain   . Sinus bradycardia   . Falls   . Memory disorder   . GERD (gastroesophageal reflux disease)     HOSPITAL COURSE:   1. 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 Patient was on Aggrenox as outpatient. I will change to aspirin and Plavix. Increase Lipitor to 40 mg daily. Patient very weak and hard to ambulate and move around. Physical therapy recommended rehabilitation. 2. Cervical spinal degeneration with canal stenosis. Patient having no pain in his neck and no symptoms from this. Patient can follow-up with his back specialist as outpatient. 3. Bradycardia. Patient states he's always had this. I will decrease the clonidine from 0.2 mg to 0.1 mg for 1 week. Can stop the clonidine 0.1 mg  after one week. 4. Essential hypertension. Allow permissive hypertension at this point. Once clonidine as stopped after one week, you may have to add other blood pressure medications to compensate. 5. Type 2 diabetes under good control hemoglobin A1c actually on the lower side at 6.4. I will decrease his Levemir dosing down to 32 units in the a.m. and 24 units in the p.m. 6. Chronic back pain on Percocet 7. Gastroesophageal reflux disease without esophagitis on PPI 8. BPH on Flomax 9. Patient had a fall in the hospital slid off the bed. Nothing hurts at this time no further imaging ordered  DISCHARGE CONDITIONS:   Satisfactory  CONSULTS OBTAINED:  Treatment Team:  Catarina Hartshorn, MD Alexis Goodell, MD  DRUG ALLERGIES:  No Known Allergies  DISCHARGE MEDICATIONS:   Current Discharge Medication List    START taking these medications   Details  aspirin 81 MG chewable tablet Chew 1 tablet (81 mg total) by mouth daily. Qty: 30 tablet, Refills: 0    clopidogrel (PLAVIX) 75 MG tablet Take 1 tablet (75 mg total) by mouth daily. Qty: 30 tablet, Refills: 0      CONTINUE these medications which have CHANGED   Details  amLODipine (NORVASC) 10 MG tablet Take 1 tablet (10 mg total) by mouth daily. Qty: 30 tablet, Refills: 0    atorvastatin (LIPITOR) 40 MG tablet Take 1 tablet (40 mg total) by mouth daily at 6 PM. Qty: 30 tablet, Refills: 0  cloNIDine (CATAPRES) 0.1 MG tablet Take 1 tablet (0.1 mg total) by mouth daily. Qty: 7 tablet, Refills: 0    insulin NPH Human (HUMULIN N) 100 UNIT/ML injection 32 units before breakfast and 24 units before supper. Qty: 10 mL, Refills: 11    oxyCODONE-acetaminophen (PERCOCET) 7.5-325 MG tablet Take 1 tablet by mouth every 6 (six) hours as needed for moderate pain. Qty: 30 tablet, Refills: 0      CONTINUE these medications which have NOT CHANGED   Details  Blood Glucose Monitoring Suppl (RA BLOOD GLUCOSE MONITOR) DEVI Please provide  ACCU-CHEK KIT    losartan (COZAAR) 100 MG tablet Take 100 mg by mouth daily.    oxybutynin (DITROPAN-XL) 5 MG 24 hr tablet Take 5 mg by mouth at bedtime.    pantoprazole (PROTONIX) 40 MG tablet Take 40 mg by mouth 2 (two) times daily.    tamsulosin (FLOMAX) 0.4 MG CAPS capsule Take 0.4 mg by mouth daily.      STOP taking these medications     dipyridamole-aspirin (AGGRENOX) 200-25 MG 12hr capsule          DISCHARGE INSTRUCTIONS:   Satisfactory  If you experience worsening of your admission symptoms, develop shortness of breath, life threatening emergency, suicidal or homicidal thoughts you must seek medical attention immediately by calling 911 or calling your MD immediately  if symptoms less severe.  You Must read complete instructions/literature along with all the possible adverse reactions/side effects for all the Medicines you take and that have been prescribed to you. Take any new Medicines after you have completely understood and accept all the possible adverse reactions/side effects.   Please note  You were cared for by a hospitalist during your hospital stay. If you have any questions about your discharge medications or the care you received while you were in the hospital after you are discharged, you can call the unit and asked to speak with the hospitalist on call if the hospitalist that took care of you is not available. Once you are discharged, your primary care physician will handle any further medical issues. Please note that NO REFILLS for any discharge medications will be authorized once you are discharged, as it is imperative that you return to your primary care physician (or establish a relationship with a primary care physician if you do not have one) for your aftercare needs so that they can reassess your need for medications and monitor your lab values.    Today   CHIEF COMPLAINT:   Chief Complaint  Patient presents with  . Weakness    HISTORY OF PRESENT  ILLNESS:  David Sloan  is a 75 y.o. male with a known history of prior stroke presented with new left-sided weakness   VITAL SIGNS:  Blood pressure 170/69, pulse 50, temperature 97.9 F (36.6 C), temperature source Oral, resp. rate 18, height 5' 6" (1.676 m), weight 82.101 kg (181 lb), SpO2 100 %.    PHYSICAL EXAMINATION:  GENERAL:  75 y.o.-year-old patient lying in the bed with no acute distress.  EYES: Pupils equal, round, reactive to light and accommodation. No scleral icterus. Extraocular muscles intact.  HEENT: Head atraumatic, normocephalic. Oropharynx and nasopharynx clear.  NECK:  Supple, no jugular venous distention. No thyroid enlargement, no tenderness.  LUNGS: Normal breath sounds bilaterally, no wheezing, rales,rhonchi or crepitation. No use of accessory muscles of respiration.  CARDIOVASCULAR: S1, S2 normal. No murmurs, rubs, or gallops.  ABDOMEN: Soft, non-tender, non-distended. Bowel sounds present. No organomegaly or mass.  EXTREMITIES: Trace pedal edema, no cyanosis, or clubbing.  NEUROLOGIC: Cranial nerves II through XII are intact. Muscle strength 4+/5 in all extremities. Sensation intact. Gait not checked.  PSYCHIATRIC: The patient is alert and oriented x 3.  SKIN: No obvious rash, lesion, or ulcer.   DATA REVIEW:   CBC  Recent Labs Lab 10/03/15 0601  WBC 4.7  HGB 11.7*  HCT 33.9*  PLT 149*    Chemistries   Recent Labs Lab 10/02/15 1621 10/03/15 0601  NA 142  --   K 3.4*  --   CL 106  --   CO2 29  --   GLUCOSE 119*  --   BUN 16  --   CREATININE 0.90 0.95  CALCIUM 9.8  --   AST 31  --   ALT 24  --   ALKPHOS 58  --   BILITOT 0.8  --     Cardiac Enzymes  Recent Labs Lab 10/02/15 1621  TROPONINI <0.03      RADIOLOGY:  Ct Head Wo Contrast  10/02/2015  CLINICAL DATA:  Left-sided weakness. EXAM: CT HEAD WITHOUT CONTRAST TECHNIQUE: Contiguous axial images were obtained from the base of the skull through the vertex without intravenous  contrast. COMPARISON:  CT scan of March 30, 2015. FINDINGS: Bony calvarium appears intact. Mild chronic ischemic white matter disease is noted. Left frontal encephalomalacia is noted consistent with old infarction. Left occipital encephalomalacia is also noted consistent with old infarction. No mass effect or midline shift is noted. Ventricular size is within normal limits. There is no evidence of mass lesion, hemorrhage or acute infarction. IMPRESSION: Mild chronic ischemic white matter disease. 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 No acute intracranial abnormality seen. Electronically Signed   By: Marijo Conception, M.D.   On: 10/02/2015 17:03   Mr Brain Wo Contrast  10/03/2015  CLINICAL DATA:  History of multiple falls and generalized weakness. EXAM: MRI HEAD WITHOUT CONTRAST MRA HEAD WITHOUT CONTRAST TECHNIQUE: Multiplanar, multiecho pulse sequences of the brain and surrounding structures were obtained without intravenous contrast. Angiographic images of the head were obtained using MRA technique without contrast. COMPARISON:  03/30/2015 brain MRI FINDINGS: MRI HEAD FINDINGS Calvarium and upper cervical spine: No focal marrow signal abnormality. Upper cervical facet arthropathy and disc degeneration with C3-4 and C4-5 canal stenosis. Orbits: Negative. Sinuses and Mastoids: Clear. Brain: 5 mm right subthalamic acute infarct, nonhemorrhagic. Remote lacunar infarcts in the right thalamus, deep cerebral white matter tracts, right caudate body, and right pons. Remote small to moderate cortical infarcts in the left occipital pole and high parasagittal left frontal lobe. Few foci of chronic microhemorrhage without specific pattern, likely post ischemic. No hemorrhage, hydrocephalus, mass lesion, or evidence of major vessel occlusion MRA HEAD FINDINGS Symmetric carotid and vertebral arteries. Symmetric and unremarkable vertebrobasilar branching. Small of any communicating arteries. Atherosclerotic type  irregularity of the bilateral carotid siphons, with moderate stenosis at the right anterior genu. The M1 and A1 segments are patent there is a high-grade distal left M1 segment stenosis. Focal apparent advanced narrowing of the lower branch right M2 vessel is likely combination of poor signal and atherosclerosis. No evidence of major branch occlusion. Smooth and widely patent vertebral, basilar, and proximal posterior cerebral arteries. Beginning at the P3 segments there is atherosclerotic irregularity and narrowing When accounting for infundibula at the ophthalmic origins no aneurysm is suspected. IMPRESSION: 1. 5 mm acute right subthalamic infarct. 2. Extensive chronic ischemic injury as described above. 3. No acute arterial finding. 4.  Intracranial atherosclerosis with high-grade left distal M1 and right M2 stenoses. 5. 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 Electronically Signed   By: Monte Fantasia M.D.   On: 10/03/2015 12:37   US Carotid Bilateral  10/03/2015  CLINICAL DATA:  75 year old male with symptoms of transient ischemic attack EXAM: BILATERAL CAROTID DUPLEX ULTRASOUND TECHNIQUE: Pearline Cables scale imaging, color Doppler and duplex ultrasound were performed of bilateral carotid and vertebral arteries in the neck. COMPARISON:  Prior duplex carotid ultrasound 03/30/2015 ; prior CTA neck 03/30/2015 FINDINGS: Criteria: Quantification of carotid stenosis is based on velocity parameters that correlate the residual internal carotid diameter with NASCET-based stenosis levels, using the diameter of the distal internal carotid lumen as the denominator for stenosis measurement. The following velocity measurements were obtained: RIGHT ICA:  60/8 cm/sec CCA:  25/8 cm/sec SYSTOLIC ICA/CCA RATIO:  0.6 DIASTOLIC ICA/CCA RATIO:  1.7 ECA:  149 cm/sec LEFT ICA:  84/12 cm/sec CCA:  52/7 cm/sec SYSTOLIC ICA/CCA RATIO:  1.1 DIASTOLIC ICA/CCA RATIO:  1.7 ECA:  85 cm/sec RIGHT CAROTID ARTERY:  Stable appearance of mild smooth heterogeneous atherosclerotic plaque in the carotid bifurcation and proximal internal carotid artery. By peak systolic velocity criteria the estimated stenosis remains less than 50%. RIGHT VERTEBRAL ARTERY:  Patent with normal antegrade flow. LEFT CAROTID ARTERY: Stable appearance of mild heterogeneous but smooth atherosclerotic plaque in the proximal internal carotid artery. By peak systolic velocity criteria the estimated stenosis remains less than 50%. LEFT VERTEBRAL ARTERY:  Patent with normal antegrade flow. IMPRESSION: 1. Mild (1-49%) stenosis proximal right internal carotid artery secondary to smooth heterogeneous atherosclerotic plaque. 2. Mild (1-49%) stenosis proximal left internal carotid artery secondary to smooth, heterogeneous atherosclerotic plaque. 3. Vertebral arteries are patent with normal antegrade flow. Signed, Criselda Peaches, MD [facility] Electronically Signed   By: Jacqulynn Cadet M.D.   On: 10/03/2015 11:10   Mr Jodene Nam Head/brain Wo Cm  10/03/2015  CLINICAL DATA:  History of multiple falls and generalized weakness. EXAM: MRI HEAD WITHOUT CONTRAST MRA HEAD WITHOUT CONTRAST TECHNIQUE: Multiplanar, multiecho pulse sequences of the brain and surrounding structures were obtained without intravenous contrast. Angiographic images of the head were obtained using MRA technique without contrast. COMPARISON:  03/30/2015 brain MRI FINDINGS: MRI HEAD FINDINGS Calvarium and upper cervical spine: No focal marrow signal abnormality. Upper cervical facet arthropathy and disc degeneration with C3-4 and C4-5 canal stenosis. Orbits: Negative. Sinuses and Mastoids: Clear. Brain: 5 mm right subthalamic acute infarct, nonhemorrhagic. Remote lacunar infarcts in the right thalamus, deep cerebral white matter tracts, right caudate body, and right pons. Remote small to moderate cortical infarcts in the left occipital  pole and high parasagittal left frontal lobe. Few foci of chronic microhemorrhage without specific pattern, likely post ischemic. No hemorrhage, hydrocephalus, mass lesion, or evidence of major vessel occlusion MRA HEAD FINDINGS Symmetric carotid and vertebral arteries. Symmetric and unremarkable vertebrobasilar branching. Small of any communicating arteries. Atherosclerotic type irregularity of the bilateral carotid siphons, with moderate stenosis at the right anterior genu. The M1 and A1 segments are patent there is a high-grade distal left M1 segment stenosis. Focal apparent advanced narrowing of the lower branch right M2 vessel is likely combination of poor signal and atherosclerosis. No evidence of major branch occlusion. Smooth and widely patent vertebral, basilar, and proximal posterior cerebral arteries. Beginning at the P3 segments there is atherosclerotic irregularity and narrowing When accounting for infundibula at the ophthalmic origins no aneurysm is suspected. IMPRESSION:  1. 5 mm acute right subthalamic infarct. 2. Extensive chronic ischemic injury as described above. 3. No acute arterial finding. 4. Intracranial atherosclerosis with high-grade left distal M1 and right M2 stenoses. 5. 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 Electronically Signed   By: Monte Fantasia M.D.   On: 10/03/2015 12:37    Management plans discussed with the patient, family and they are in agreement.  CODE STATUS:     Code Status Orders        Start     Ordered   10/03/15 0157  Full code   Continuous     10/03/15 0156    Code Status History    Date Active Date Inactive Code Status Order ID Comments User Context   03/30/2015 11:29 AM 03/31/2015  8:13 PM Full Code [id number]  Henreitta Leber, MD Inpatient      TOTAL TIME TAKING CARE OF THIS PATIENT: 35 minutes.    Loletha Grayer M.D on 10/04/2015 at 9:00 AM  Between 7am to 6pm - Pager - [phone]  After 6pm go  to [email or web address] - password [email or web address]  [employer] Office  [phone]  CC: Primary care physician; Harlow Ohms, MD    [email or web address]

## 2015-10-04 NOTE — Progress Notes (Signed)
Report given to [employer]. Veva Grimley S, RN  

## 2015-10-04 NOTE — Discharge Instructions (Signed)
Ischemic Stroke Treated Without Warfarin °An ischemic stroke (cerebrovascular accident) is the sudden death of brain tissue. It is a medical emergency. An ischemic stroke can cause permanent loss of brain function. This can cause problems with different parts of your body. °CAUSES °An ischemic stroke is caused by a decrease of oxygen supply to an area of your brain. It is usually the result of a small blood clot (embolus) or collection of cholesterol or fat (plaque) that blocks blood flow in the brain. An ischemic stroke can also be caused by blocked or damaged carotid arteries. °RISK FACTORS °· High blood pressure (hypertension). °· High cholesterol. °· Diabetes mellitus. °· Heart disease. °· The buildup of plaque in the blood vessels (peripheral artery disease or atherosclerosis). °· The buildup of plaque in the blood vessels that provide blood and oxygen to the brain (carotid artery stenosis). °· An abnormal heart rhythm (atrial fibrillation). °· Obesity. °· Smoking cigarettes. °· Taking oral contraceptives, especially in combination with using tobacco. °· Physical inactivity. °· A diet that is high in fats, salt (sodium), and calories. °· Excessive alcohol use. °· Use of illegal drugs, especially cocaine and methamphetamine. °· Being African American. °· Being over the age of 55 years. °· Family history of stroke. °· Previous history of blood clots, stroke, TIA (transient ischemic attack), or heart attack. °· Sickle cell disease. °SIGNS AND SYMPTOMS °These symptoms usually develop suddenly, or you may notice them after waking up from sleep. Symptoms may include sudden: °· Weakness or numbness in your face, arm, or leg, especially on one side of your body. °· Confusion. °· Trouble speaking (aphasia) or understanding speech. °· Trouble seeing with one or both eyes. °· Trouble walking or difficulty moving your arms or legs. °· Dizziness. °· Loss of balance or coordination. °· Severe headache with no known cause.  The headache is often described as the worst headache ever experienced. °DIAGNOSIS °Your health care provider can often determine the presence or absence of an ischemic stroke based on your symptoms, history, and physical exam. CT (computed tomography) of the brain is usually performed to confirm the stroke, determine causes, and determine stroke severity. Other tests may be done to find the cause of the stroke. These tests may include: °· ECG (electrocardiogram). °· Continuous heart monitoring. °· Echocardiogram. °· Carotid ultrasound. °· MRI. °· A scan of the brain circulation. °· Blood tests. °TREATMENT °It is very important to seek treatment at the first sign of stroke symptoms. Your health care provider may perform the following treatments within 6 hours of the onset of stroke symptoms: °· Medicine to dissolve the blood clot (thrombolytic). °· Inserting a device into the affected artery to remove the blood clot. °These treatments may not be effective if too much time has passed since your stroke symptoms began. Even if you do not know when your symptoms began, get treatment as soon as possible. There are other treatment options that may be given, such as: °· Oxygen. °· IV fluids. °· Medicines to thin the blood (anticoagulants). °· A procedure to widen blocked arteries. °Your treatment will depend on how long you have had your symptoms, the severity of your symptoms, and the cause of your symptoms. °Your health care provider will take measures to prevent short-term and long-term complications of stroke, such as: °· Breathing foreign material into the lungs (aspiration pneumonia). °· Blood clots in the legs. °· Bedsores. °· Falls. °Medicines and dietary changes may be used to help treat and manage risk factors for   stroke, such as diabetes and high blood pressure. °If any of your body's functions were impaired by stroke, you may work with physical, speech, or occupational therapists to help you recover. °HOME CARE  INSTRUCTIONS °· Take medicines only as directed by your health care provider. Follow the directions carefully. Medicines may be used to control risk factors for a stroke. Be sure that you understand all your medicine instructions. °· If swallow studies have determined that your swallowing reflex is present, you should eat healthy foods. Foods may need to be a soft or pureed consistency, or you may need to take small bites in order to avoid aspirating or choking. °· Follow physical activity guidelines as directed by your health care team. °· Do not use any tobacco products, including cigarettes, chewing tobacco, or electronic cigarettes. If you smoke, quit. If you need help quitting, ask your health care provider. °· Limit or stop alcohol use. °· A safe home environment is important to reduce the risk of falls. Your health care provider may arrange for specialists to evaluate your home. Having grab bars in the bedroom and bathroom is often important. Your health care provider may arrange for equipment to be used at home, such as raised toilets and a seat for the shower. °· Ongoing physical, occupational, and speech therapy may be needed to maximize your recovery after a stroke. If you have been advised to use a walker or a cane, use it at all times. Be sure to keep your therapy appointments. °· Keep all follow-up visits with your health care provider. This is very important. This includes any referrals, therapy, rehabilitation, and lab tests. Proper follow-up can prevent another stroke from occurring. °PREVENTION °The risk of a stroke can be decreased by appropriately treating high blood pressure, high cholesterol, diabetes, heart disease, and obesity. It can also be decreased by quitting smoking, limiting alcohol, and staying physically active. °SEEK IMMEDIATE MEDICAL CARE IF: °· You have sudden weakness or numbness in your face, arm, or leg, especially on one side of your body. °· You have sudden confusion. °· You  have sudden trouble speaking (aphasia) or understanding. °· You have sudden trouble seeing with one or both eyes. °· You have sudden trouble walking or difficulty moving your arms or legs. °· You have sudden dizziness. °· You have a sudden loss of balance or coordination. °· You have a sudden, severe headache with no known cause. °· You have a partial or total loss of consciousness. °Any of these symptoms may represent a serious problem that is an emergency. Do not wait to see if the symptoms will go away. Get medical help right away. Call your local emergency services (911 in [place] Do not drive yourself to the hospital. °  °This information is not intended to replace advice given to you by your health care provider. Make sure you discuss any questions you have with your health care provider. °  °Document Released: 03/17/2014 Document Reviewed: 03/17/2014 °Elsevier Interactive Patient Education ©2016 [employer] ° °

## 2015-10-04 NOTE — Progress Notes (Signed)
Patient discharged via EMS. Cleven Jansma S, RN  

## 2015-10-04 NOTE — Progress Notes (Signed)
Spoke with Ephriam KnucklesChristian, [facility] at [phone], to notify of non-emergent EMS transport.  Auth notification reference given as [id number].   Service date range good from 10/04/15 - 01/02/16.   Gap exception requested to determine if services can be considered at an in-network level.

## 2015-10-04 NOTE — Clinical Social Work Placement (Signed)
   CLINICAL SOCIAL WORK PLACEMENT  NOTE  Date:  10/04/2015  Patient Details  Name: David Sloan MRN: [id number] Date of Birth: 05/07/1941  Clinical Social Work is seeking post-discharge placement for this patient at the Skilled  Nursing Facility level of care (*CSW will initial, date and re-position this form in  chart as items are completed):  Yes   Patient/family provided with [facility] Social Work Department's list of facilities offering this level of care within the geographic area requested by the patient (or if unable, by the patient's family).  Yes   Patient/family informed of their freedom to choose among providers that offer the needed level of care, that participate in Medicare, Medicaid or managed care program needed by the patient, have an available bed and are willing to accept the patient.  Yes   Patient/family informed of [facility]'s ownership interest in [facility] and [facility], as well as of the fact that they are under no obligation to receive care at these facilities.  PASRR submitted to EDS on 10/03/15     PASRR number received on 10/03/15     Existing PASRR number confirmed on       FL2 transmitted to all facilities in geographic area requested by pt/family on 10/03/15     FL2 transmitted to all facilities within larger geographic area on       Patient informed that his/her managed care company has contracts with or will negotiate with certain facilities, including the following:        Yes   Patient/family informed of bed offers received.  Patient chooses bed at [facility]     Physician recommends and patient chooses bed at  [facility](SNF)    Patient to be transferred to [facility] on 10/04/15.  Patient to be transferred to facility by [facility]     Patient family notified on 10/04/15 of transfer.  Name of family member notified:  Mrs. Radich     PHYSICIAN       Additional Comment:     _______________________________________________ Dede QuerySarah Jisele Price, LCSW 10/04/2015, 11:14 AM

## 2015-10-04 NOTE — NC FL2 (Signed)
[place] MEDICAID FL2 LEVEL OF CARE SCREENING TOOL     IDENTIFICATION  Patient Name: David Sloan Birthdate: [birth year] Sex: male Admission Date (Current Location): 10/02/2015  [place] and [place] Number:  Chiropodist and Address:  [facility], [address], [place], [place] [address]      Provider Number: [id number]  Attending Physician Name and Address:  Alford Highland, MD  Relative Name and Phone Number:       Current Level of Care: Hospital Recommended Level of Care: Skilled Nursing Facility Prior Approval Number:    Date Approved/Denied:   PASRR Number: [id number]  Discharge Plan: SNF    Current Diagnoses: Patient Active Problem List   Diagnosis Date Noted  . Fall 10/03/2015  . Gait instability 10/03/2015  . TIA (transient ischemic attack) 10/03/2015  . Acute CVA (cerebrovascular accident) (HCC) 10/03/2015  . CVA (cerebral infarction) 10/03/2015  . Scoliosis 04/13/2015  . Fibrositis 04/13/2015  . Slurred speech 03/31/2015  . Elevated transaminase level 03/31/2015  . Gallstones 03/31/2015  . Lower extremity weakness 03/30/2015  . Cerebral infarction due to unspecified mechanism   . Elevated troponin   . Uncontrolled type 2 diabetes mellitus with circulatory disorder (HCC)   . Hyperlipidemia   . EKG abnormalities   . Emesis   . Gall bladder disease   . Other long term (current) drug therapy 02/13/2015  . Essential (primary) hypertension 09/18/2014  . Neuralgia neuritis, sciatic nerve 09/15/2014  . Bradycardia 09/13/2014  . Cognitive disorder 04/05/2014  . Chronic pain associated with significant psychosocial dysfunction 01/17/2014  . Back ache 01/17/2014  . Esophageal stenosis 11/30/2013  . Can't get food down 11/30/2013  . Continuous opioid dependence (HCC) 07/06/2013  . Cerebrovascular accident (CVA) (HCC) 05/19/2013  . Adult hypothyroidism 05/19/2013  . Acid reflux 05/19/2013  . Lumbar and sacral  osteoarthritis 01/11/2013  . Arthralgia, sacroiliac 10/06/2012  . Arthritis, degenerative 10/06/2012  . Obstructive apnea 10/06/2012  . Degeneration of intervertebral disc of lumbar region 10/06/2012  . Lumbar and sacral arthritis 10/06/2012  . Diabetes mellitus (HCC) 10/06/2012  . Punctate keratitis 01/21/2012  . 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HCC) 01/21/2012  . Type 2 diabetes mellitus (HCC) 10/16/2010  . Dry eye syndrome 10/16/2010  . Corneal changes, senile 10/16/2010  . Primary open angle glaucoma 10/16/2010  . Glaucoma 10/16/2010  . Nonproliferative diabetic retinopathy (HCC) 10/16/2010    Orientation RESPIRATION BLADDER Height & Weight     Self, Time, Situation, Place  Normal Incontinent Weight: 181 lb (82.101 kg) Height:   (167.6 cm)  BEHAVIORAL SYMPTOMS/MOOD NEUROLOGICAL BOWEL NUTRITION STATUS      Continent Diet (Heart Healthy/Carb Modified, Thin Liquids)  AMBULATORY STATUS COMMUNICATION OF NEEDS Skin   Extensive Assist Verbally Normal                       Personal Care Assistance Level of Assistance  Bathing, Feeding, Dressing Bathing Assistance: Maximum assistance Feeding assistance: Independent Dressing Assistance: Maximum assistance     Functional Limitations Info  Sight, Hearing, Speech Sight Info: Adequate Hearing Info: Adequate Speech Info: Adequate    SPECIAL CARE FACTORS FREQUENCY  PT (By licensed PT), OT (By licensed OT)     PT Frequency: 5 OT Frequency: 5            Contractures      Additional Factors Info  Code Status, Allergies Code Status Info: Full Code Allergies Info: No known allergies  Current Medications (10/04/2015):  This is the current hospital active medication list Current Facility-Administered Medications  Medication Dose Route Frequency Provider Last Rate Last Dose  . acetaminophen (TYLENOL) tablet 650 mg  650 mg Oral Q4H PRN Ihor AustinPavan Pyreddy, MD   650 mg at 10/03/15 2036   Or  . acetaminophen (TYLENOL)  suppository 650 mg  650 mg Rectal Q4H PRN Ihor AustinPavan Pyreddy, MD      . aspirin chewable tablet 81 mg  81 mg Oral Daily Thana FarrLeslie Reynolds, MD   81 mg at 10/03/15 1802  . atorvastatin (LIPITOR) tablet 40 mg  40 mg Oral q1800 Alford Highlandichard Broly Hatfield, MD   40 mg at 10/03/15 1802  . cloNIDine (CATAPRES) tablet 0.1 mg  0.1 mg Oral Daily Alford Highlandichard Samella Lucchetti, MD      . clopidogrel (PLAVIX) tablet 75 mg  75 mg Oral Daily Alford Highlandichard Marolyn Urschel, MD   75 mg at 10/03/15 1510  . enoxaparin (LOVENOX) injection 40 mg  40 mg Subcutaneous Q24H Ihor AustinPavan Pyreddy, MD   40 mg at 10/03/15 2036  . insulin aspart (novoLOG) injection 0-9 Units  0-9 Units Subcutaneous TID WC Alford Highlandichard Kijuan Gallicchio, MD   2 Units at 10/03/15 1315  . insulin detemir (LEVEMIR) injection 30 Units  30 Units Subcutaneous QAC supper Ihor AustinPavan Pyreddy, MD   30 Units at 10/03/15 1702  . insulin detemir (LEVEMIR) injection 35 Units  35 Units Subcutaneous QAC breakfast Ihor AustinPavan Pyreddy, MD   35 Units at 10/03/15 0914  . losartan (COZAAR) tablet 100 mg  100 mg Oral Daily Ihor AustinPavan Pyreddy, MD   100 mg at 10/03/15 0914  . oxybutynin (DITROPAN-XL) 24 hr tablet 5 mg  5 mg Oral QHS Ihor AustinPavan Pyreddy, MD   5 mg at 10/03/15 2036  . oxyCODONE-acetaminophen (PERCOCET) 7.5-325 MG per tablet 1 tablet  1 tablet Oral Q6H PRN Pavan Pyreddy, MD      . pantoprazole (PROTONIX) EC tablet 40 mg  40 mg Oral BID Ihor AustinPavan Pyreddy, MD   40 mg at 10/03/15 2036  . senna-docusate (Senokot-S) tablet 1 tablet  1 tablet Oral QHS PRN Ihor AustinPavan Pyreddy, MD      . tamsulosin (FLOMAX) capsule 0.4 mg  0.4 mg Oral Daily Ihor AustinPavan Pyreddy, MD   0.4 mg at 10/03/15 [id number]     Discharge Medications: Please see discharge summary for a list of discharge medications.  Relevant Imaging Results:  Relevant Lab Results:   Additional Information SSN:  [id number]  Dede QuerySarah McNulty, LCSW

## 2016-05-24 IMAGING — CR DG ABDOMEN ACUTE W/ 1V CHEST
4 series · 4 of 4 positions shown · non-contrast
Comparison: Portable chest 05/06/2013.

CLINICAL DATA: 74-year-old male with epigastric pain since 7317
hours. Initial encounter.

EXAM:
DG ABDOMEN ACUTE W/ 1V CHEST

[chest pa]
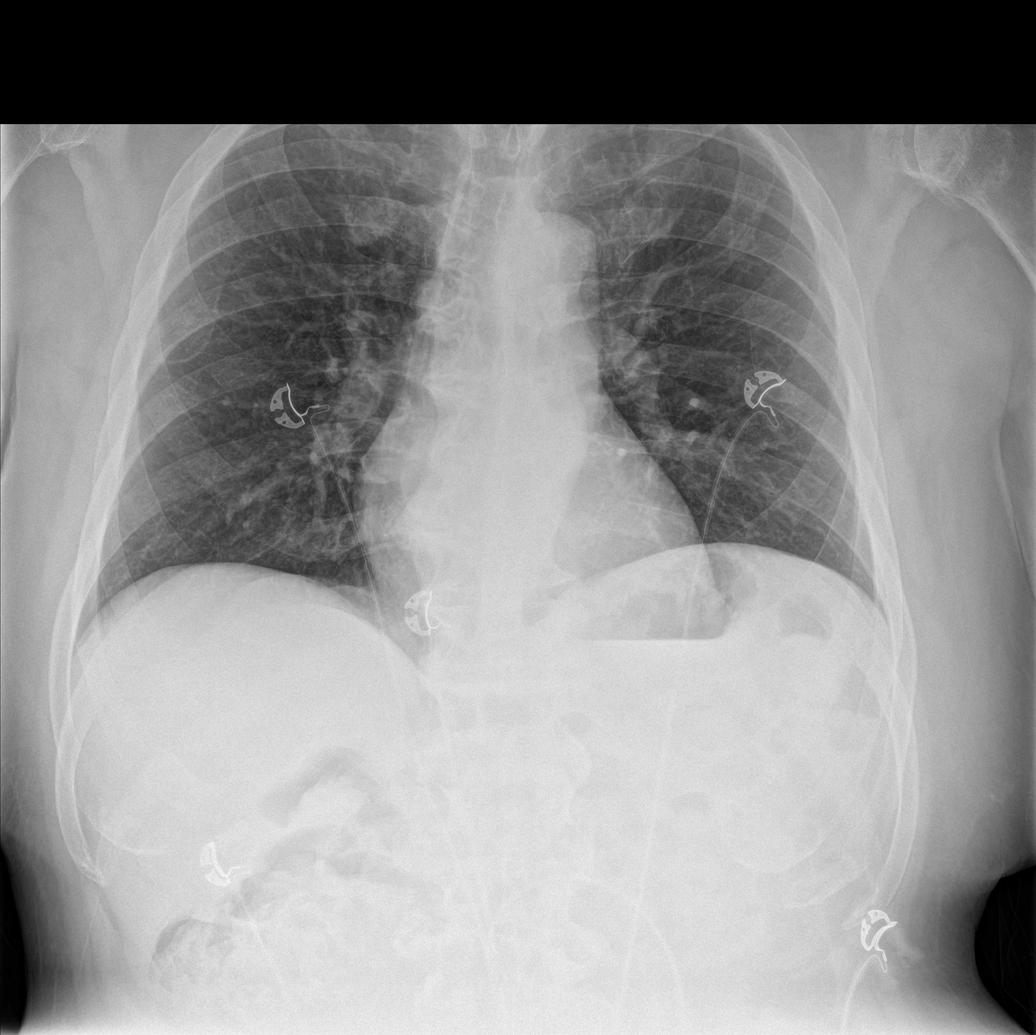

[abdomen erect]
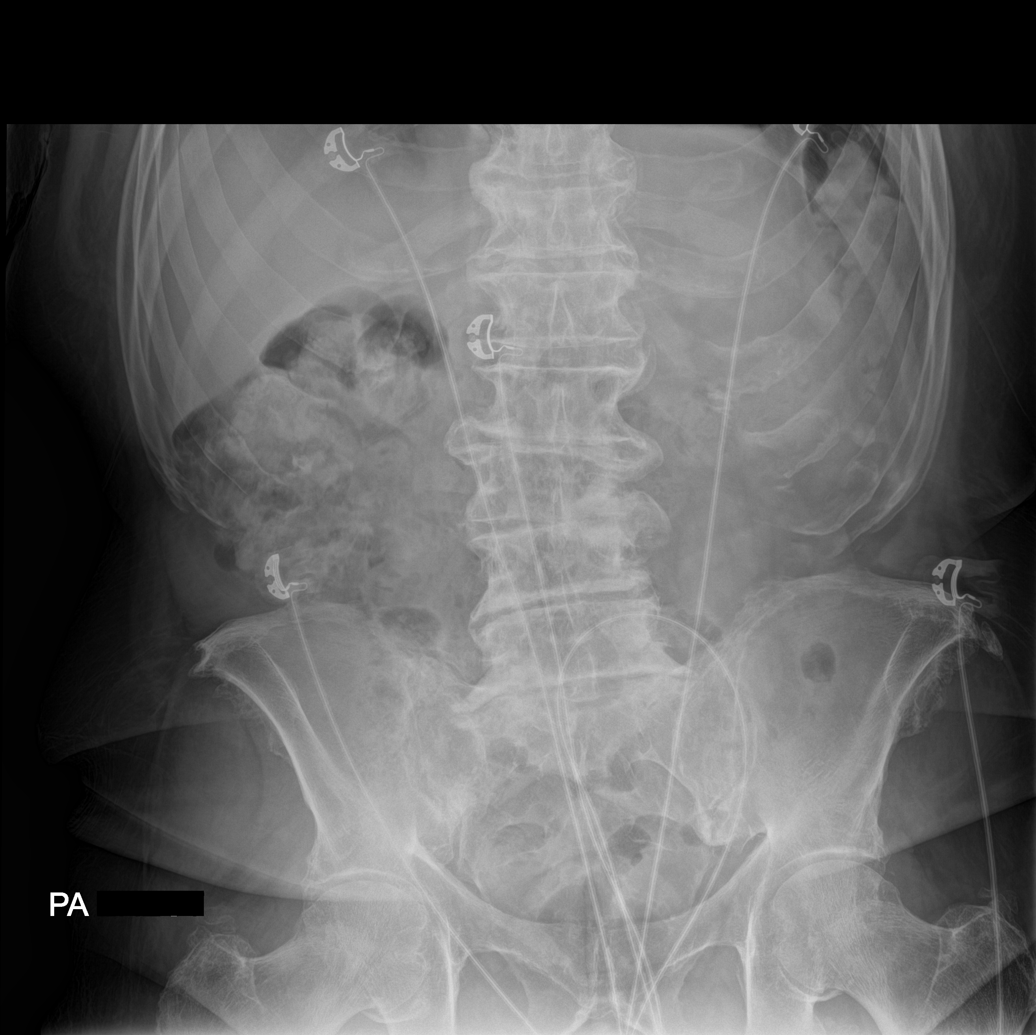

[abdomen supine (1 of 2)]
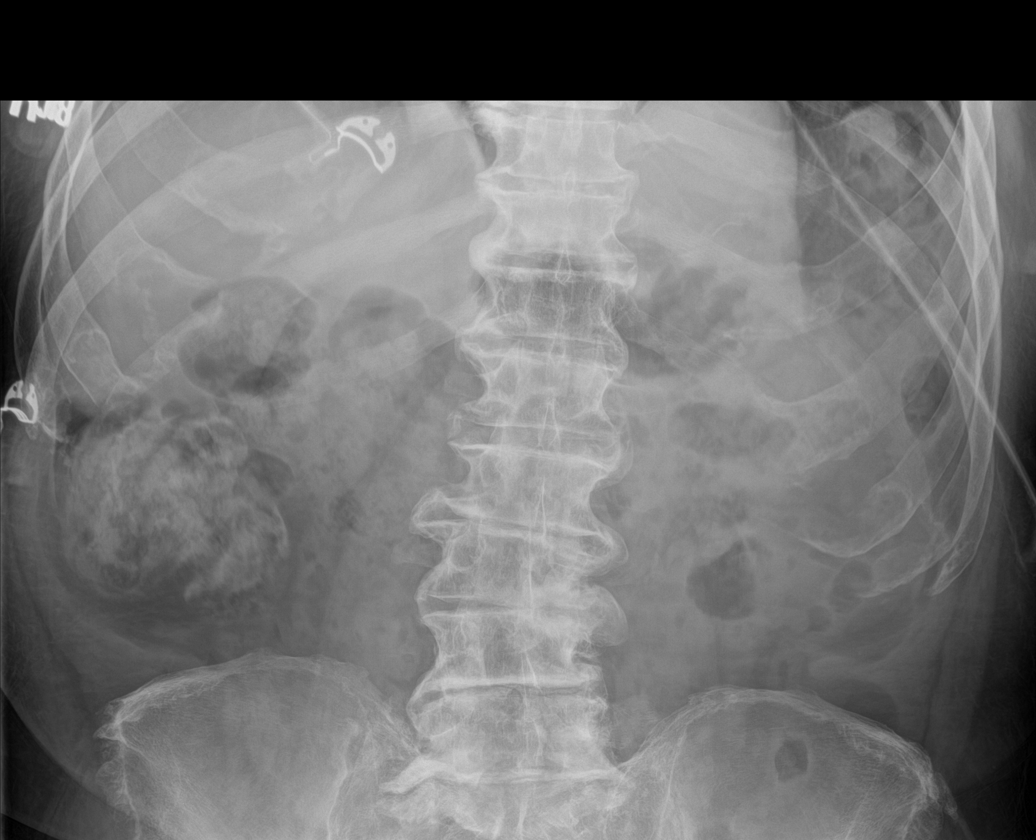

[abdomen supine (2 of 2)]
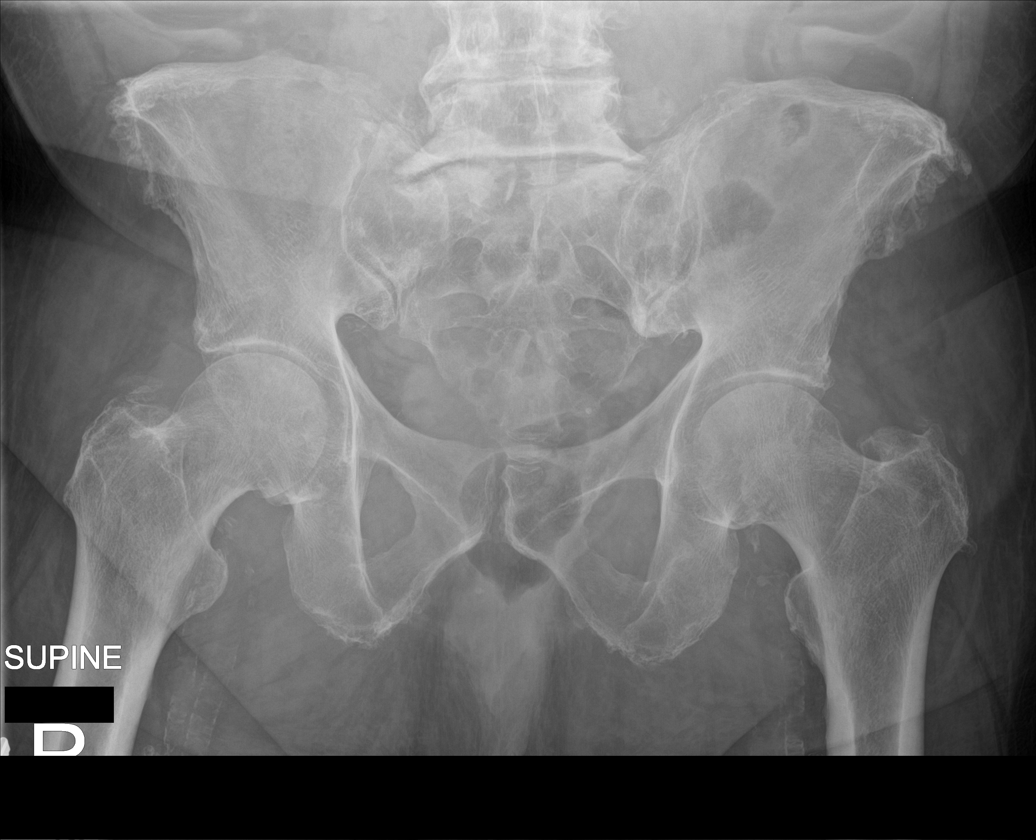

[4 of 4 positions shown; findings below may reference images not displayed]

FINDINGS: Stable lung volumes. Normal cardiac size and mediastinal contours.
The lungs are clear. No pneumothorax or pneumoperitoneum.

Upright and supine views of the abdomen. Small air-fluid level in
the stomach. Non obstructed bowel gas pattern. Abdominal and pelvic
visceral contours are within normal limits. Calcified aortic
atherosclerosis continuing into the proximal lower extremities.
Widespread spinal disc and endplate degeneration with bulky endplate
osteophytosis. No acute osseous abnormality identified.
IMPRESSION: 1.  No acute cardiopulmonary abnormality.
2.  Normal bowel gas pattern, no free air.

## 2016-08-05 DIAGNOSIS — Z0289 Encounter for other administrative examinations: Secondary | ICD-10-CM | POA: Insufficient documentation

## 2016-09-29 DIAGNOSIS — M898X5 Other specified disorders of bone, thigh: Secondary | ICD-10-CM | POA: Insufficient documentation

## 2016-09-29 DIAGNOSIS — A6001 Herpesviral infection of penis: Secondary | ICD-10-CM | POA: Insufficient documentation

## 2017-04-25 ENCOUNTER — Encounter: Payer: Self-pay | Admitting: Emergency Medicine

## 2017-04-25 ENCOUNTER — Emergency Department: Payer: Medicare Other

## 2017-04-25 ENCOUNTER — Emergency Department
Admission: EM | Admit: 2017-04-25 | Discharge: 2017-04-25 | Disposition: A | Discharge status: Home / Self Care | Payer: Medicare Other | Attending: Emergency Medicine | Admitting: Emergency Medicine

## 2017-04-25 ENCOUNTER — Other Ambulatory Visit: Payer: Self-pay

## 2017-04-25 DIAGNOSIS — E119 Type 2 diabetes mellitus without complications: Secondary | ICD-10-CM | POA: Diagnosis not present

## 2017-04-25 DIAGNOSIS — Z7982 Long term (current) use of aspirin: Secondary | ICD-10-CM | POA: Insufficient documentation

## 2017-04-25 DIAGNOSIS — Y939 Activity, unspecified: Secondary | ICD-10-CM | POA: Diagnosis not present

## 2017-04-25 DIAGNOSIS — Z794 Long term (current) use of insulin: Secondary | ICD-10-CM | POA: Insufficient documentation

## 2017-04-25 DIAGNOSIS — Z7901 Long term (current) use of anticoagulants: Secondary | ICD-10-CM | POA: Insufficient documentation

## 2017-04-25 DIAGNOSIS — Z79899 Other long term (current) drug therapy: Secondary | ICD-10-CM | POA: Insufficient documentation

## 2017-04-25 DIAGNOSIS — M25551 Pain in right hip: Secondary | ICD-10-CM | POA: Insufficient documentation

## 2017-04-25 DIAGNOSIS — Y998 Other external cause status: Secondary | ICD-10-CM | POA: Diagnosis not present

## 2017-04-25 DIAGNOSIS — Y929 Unspecified place or not applicable: Secondary | ICD-10-CM | POA: Insufficient documentation

## 2017-04-25 DIAGNOSIS — Z8673 Personal history of transient ischemic attack (TIA), and cerebral infarction without residual deficits: Secondary | ICD-10-CM | POA: Insufficient documentation

## 2017-04-25 DIAGNOSIS — Z87891 Personal history of nicotine dependence: Secondary | ICD-10-CM | POA: Diagnosis not present

## 2017-04-25 DIAGNOSIS — I10 Essential (primary) hypertension: Secondary | ICD-10-CM | POA: Diagnosis not present

## 2017-04-25 DIAGNOSIS — E785 Hyperlipidemia, unspecified: Secondary | ICD-10-CM | POA: Insufficient documentation

## 2017-04-25 DIAGNOSIS — W010XXA Fall on same level from slipping, tripping and stumbling without subsequent striking against object, initial encounter: Secondary | ICD-10-CM | POA: Diagnosis not present

## 2017-04-25 LAB — GLUCOSE, CAPILLARY: GLUCOSE-CAPILLARY: 87 mg/dL (ref 65–99)

## 2017-04-25 MED ORDER — OXYCODONE-ACETAMINOPHEN 5-325 MG PO TABS
1.0000 | ORAL_TABLET | Freq: Once | ORAL | Status: AC
  Administered 2017-04-25: 1 via ORAL
  Filled 2017-04-25: qty 1

## 2017-04-25 NOTE — ED Notes (Signed)
Pt voided in pull up, pt cleaned and clean diaper applied to pt.  Pt then assisted to put pants back on.  Pt given call light.

## 2017-04-25 NOTE — ED Notes (Signed)
Pt discharged to home.  Family member driving.  Discharge instructions reviewed.  Verbalized understanding.  No questions or concerns at this time.  Teach back verified.  Pt in NAD.  No items left in ED.   

## 2017-04-25 NOTE — ED Notes (Signed)
Pt given peanut butter and crackers due to no sandwich trays;pt blood glucose was 87

## 2017-04-25 NOTE — ED Notes (Signed)
Pt fell outside yesterday lost balance, pt denies hitting head, or LOC. Pt states "he crawled backwards on his elbows to get to deck rails." Pt is NAD and A/O at this time. Pt baseline walks with walker.

## 2017-04-25 NOTE — ED Notes (Signed)
Pt assisted to use urinal.  Pt needed 1 person assist.  Also called pt son, Jeri CosDeshannon, [phone] and advised pt was ready for discharge.

## 2017-04-25 NOTE — ED Provider Notes (Signed)
[facility] Emergency Department Provider Note   ____________________________________________   First MD Initiated Contact with Patient 04/25/17 1224     (approximate)  I have reviewed the triage vital signs and the nursing notes.   HISTORY  Chief Complaint Fall    HPI David Sloan is a 76 y.o. male patient complaining of right hip pain secondary to a fall yesterday. Patient lost his balance and fell landing on the right side. Patient pain increase with weightbearing. He said he went and played with discomfort. It is noted patient has a heart rate of 47. Patient state this is in his normal range between 47 and 50 beats per minute. The patient does admit to having repetitive falls. Patient denies LOC.  Past Medical History:  Diagnosis Date  . Atypical chest pain    a. 04/2013 MV: nl EF, no ischemia.  Marland Kitchen BPH (benign prostatic hyperplasia)   . Chronic back pain   . Diabetes mellitus without complication ([place])   . Diabetic neuropathy ([place])   . Essential hypertension   . Falls   . GERD (gastroesophageal reflux disease)   . Memory disorder   . OSA (obstructive sleep apnea)   . Osteoarthritis    a. s/p bilat knee surgeries.  . Sinus bradycardia   . Stroke [facility])    a. ~ 2002 - no residual deficits;  b. 03/2015 Head CT: prior old infarcts and foci of small vesel dzs in pons bilat. ? bilat occipital lobe acute infarcts R>L.  Marland Kitchen Urinary incontinence     Patient Active Problem List   Diagnosis Date Noted  . Fall 10/03/2015  . Gait instability 10/03/2015  . TIA (transient ischemic attack) 10/03/2015  . Acute CVA (cerebrovascular accident) ([place]) 10/03/2015  . CVA (cerebral infarction) 10/03/2015  . Scoliosis 04/13/2015  . Fibrositis 04/13/2015  . Slurred speech 03/31/2015  . Elevated transaminase level 03/31/2015  . Gallstones 03/31/2015  . Lower extremity weakness 03/30/2015  . Cerebral infarction due to unspecified mechanism   . Elevated troponin    . Uncontrolled type 2 diabetes mellitus with circulatory disorder ([place])   . Hyperlipidemia   . EKG abnormalities   . Emesis   . Gall bladder disease   . Other long term (current) drug therapy 02/13/2015  . Essential (primary) hypertension 09/18/2014  . Neuralgia neuritis, sciatic nerve 09/15/2014  . Bradycardia 09/13/2014  . Cognitive disorder 04/05/2014  . Chronic pain associated with significant psychosocial dysfunction 01/17/2014  . Back ache 01/17/2014  . Esophageal stenosis 11/30/2013  . Can't get food down 11/30/2013  . Continuous opioid dependence ([place]) 07/06/2013  . Cerebrovascular accident (CVA) ([place]) 05/19/2013  . Adult hypothyroidism 05/19/2013  . Acid reflux 05/19/2013  . Lumbar and sacral osteoarthritis 01/11/2013  . Arthralgia, sacroiliac 10/06/2012  . Arthritis, degenerative 10/06/2012  . Obstructive apnea 10/06/2012  . Degeneration of intervertebral disc of lumbar region 10/06/2012  . Lumbar and sacral arthritis 10/06/2012  . Diabetes mellitus ([place]) 10/06/2012  . Punctate keratitis 01/21/2012  . 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[place]) 01/21/2012  . Type 2 diabetes mellitus ([place]) 10/16/2010  . Dry eye syndrome 10/16/2010  . Corneal changes, senile 10/16/2010  . Primary open angle glaucoma 10/16/2010  . Glaucoma 10/16/2010  . Nonproliferative diabetic retinopathy ([place]) 10/16/2010    Past Surgical History:  Procedure Laterality Date  . JOINT REPLACEMENT     bilateral knee replacements    Prior to Admission medications   Medication Sig Start Date End Date Taking? Authorizing Provider  amLODipine (NORVASC)  10 MG tablet Take 1 tablet (10 mg total) by mouth daily. 10/04/15   Loletha Grayer, MD  aspirin 81 MG chewable tablet Chew 1 tablet (81 mg total) by mouth daily. 10/04/15   Loletha Grayer, MD  atorvastatin (LIPITOR) 40 MG tablet Take 1 tablet (40 mg total) by mouth daily at 6 PM. 10/04/15   Loletha Grayer, MD  Blood Glucose Monitoring Suppl (RA BLOOD GLUCOSE MONITOR)  DEVI Please provide ACCU-CHEK KIT 04/17/15   [provider]  cloNIDine (CATAPRES) 0.1 MG tablet Take 1 tablet (0.1 mg total) by mouth daily. 10/04/15   Loletha Grayer, MD  clopidogrel (PLAVIX) 75 MG tablet Take 1 tablet (75 mg total) by mouth daily. 10/04/15   Loletha Grayer, MD  insulin NPH Human (HUMULIN N) 100 UNIT/ML injection 32 units before breakfast and 24 units before supper. 10/04/15   Loletha Grayer, MD  losartan (COZAAR) 100 MG tablet Take 100 mg by mouth daily. 12/20/14 12/20/15  [provider]  oxybutynin (DITROPAN-XL) 5 MG 24 hr tablet Take 5 mg by mouth at bedtime.    [provider]  oxyCODONE-acetaminophen (PERCOCET) 7.5-325 MG tablet Take 1 tablet by mouth every 6 (six) hours as needed for moderate pain. 10/04/15   Loletha Grayer, MD  pantoprazole (PROTONIX) 40 MG tablet Take 40 mg by mouth 2 (two) times daily. 01/15/15 01/15/16  [provider]    Allergies Patient has no known allergies.  Family History  Problem Relation Age of Onset  . Alzheimer's disease Mother   . Diabetes Father     Social History Social History   Tobacco Use  . Smoking status: Former Smoker    Packs/day: 1.00    Years: 25.00    Pack years: 25.00    Types: Cigarettes  . Smokeless tobacco: Never Used  Substance Use Topics  . Alcohol use: No  . Drug use: No    Review of Systems Constitutional: No fever/chills Eyes: No visual changes. ENT: No sore throat. Cardiovascular: Denies chest pain. Sinus bradycardic Respiratory: Denies shortness of breath. Gastrointestinal: No abdominal pain.  No nausea, no vomiting.  No diarrhea.  No constipation. Genitourinary: Negative for dysuria. Musculoskeletal: Right pelvic and hip pain  Skin: Negative for rash. Neurological: Negative for headaches, focal weakness or numbness. Repetitive falls Endocrine:Diabetes and hypertension ____________________________________________   PHYSICAL EXAM:  VITAL SIGNS: ED Triage  Vitals  Enc Vitals Group     BP 04/25/17 1158 (!) 148/56     Pulse Rate 04/25/17 1158 (!) 47     Resp 04/25/17 1158 16     Temp 04/25/17 1158 97.8 F (36.6 C)     Temp Source 04/25/17 1158 Oral     SpO2 04/25/17 1158 100 %     Weight 04/25/17 1159 182 lb (82.6 kg)     Height 04/25/17 1159 _0  (1.676 m)     Head Circumference --      [place] Flow --      Pain Score 04/25/17 1158 8     Pain Loc --      Pain Edu? --      Excl. in [place]? --    Constitutional: Alert and oriented. Well appearing and in no acute distress. Eyes: Conjunctivae are normal. PERRL. EOMI. Head: Atraumatic. Nose: No congestion/rhinnorhea. Mouth/Throat: Mucous membranes are moist.  Oropharynx non-erythematous. Neck: No stridor.  No cervical spine tenderness to palpation. Hematological/Lymphatic/Immunilogical: No cervical lymphadenopathy. Cardiovascular: Asymptomatic bradycardia. Grossly normal heart sounds.  Good peripheral circulation. Respiratory: Normal respiratory effort.  No retractions. Lungs CTAB. Gastrointestinal: Soft and nontender. No distention. No abdominal bruits. No CVA tenderness. Musculoskeletal: No obvious deformity to the leg length discrepancy right lower extremity. Patient is moderate guarding palpation greater trochanter and iliac crest on the right side.  Neurologic:  Normal speech and language. No gross focal neurologic deficits are appreciated. No gait instability. Skin:  Skin is warm, dry and intact. No rash noted. Psychiatric: Mood and affect are normal. Speech and behavior are normal.  ____________________________________________   LABS (all labs ordered are listed, but only abnormal results are displayed)  Labs Reviewed - No data to display ____________________________________________  EKG Read by heart station Dr.  ____________________________________________  RADIOLOGY  Dg Hip Unilat W Or Wo Pelvis 2-3 Views Right  Result Date: 04/25/2017 CLINICAL DATA:  Fall EXAM: DG HIP  (WITH OR WITHOUT PELVIS) 2-3V RIGHT COMPARISON:  04/23/2015 FINDINGS: Deformity of the right ischium is stable compatible with a chronic deformity. Osteopenia. No acute fracture or dislocation. IMPRESSION: No acute bony pathology. Electronically Signed   By: Marybelle Killings M.D.   On: 04/25/2017 13:18    __No acute findings on x-ray of the right hip _________________________________________   PROCEDURES  Procedure(s) performed: None  Procedures  Critical Care performed: No  ____________________________________________   INITIAL IMPRESSION / ASSESSMENT AND PLAN / ED COURSE  As part of my medical decision making, I reviewed the following data within the  [place]    Patient presented with pain to the right hip secondary to fall. Patient stating he's has a numerous falls in the past month. It was noted patient is asymptomatic bradycardic. EKG read by our Station Drs. unremarkable except for the rate. Discussed X-ray finding with patient advised follow-up with PCP for continual care.      ____________________________________________   FINAL CLINICAL IMPRESSION(S) / ED DIAGNOSES  Final diagnoses:  Right hip pain     ED Discharge Orders    None       Note:  This document was prepared using Dragon voice recognition software and may include unintentional dictation errors.    Sable Feil, PA-C 04/25/17 1403    Eula Listen, MD 04/27/17 2153

## 2017-04-25 NOTE — Discharge Instructions (Signed)
Otherwise ambulate with support and follow-up with family doctor due to numerous falls. Continue previous medications.

## 2017-04-25 NOTE — ED Triage Notes (Signed)
Pt to ED for fall that happened yesterday. Pt states that he lost his balance and fell. Pt is having pain in left hip but is able to walk and bare weight. Pt in NAD at this time. Pt is bradycardic in triage but states that this is baseline for him, pt asymptomatic at this time.

## 2017-06-24 DIAGNOSIS — E119 Type 2 diabetes mellitus without complications: Secondary | ICD-10-CM | POA: Insufficient documentation

## 2017-06-24 DIAGNOSIS — N400 Enlarged prostate without lower urinary tract symptoms: Secondary | ICD-10-CM | POA: Insufficient documentation

## 2017-06-24 DIAGNOSIS — H401130 Primary open-angle glaucoma, bilateral, stage unspecified: Secondary | ICD-10-CM | POA: Insufficient documentation

## 2017-06-24 DIAGNOSIS — Z8673 Personal history of transient ischemic attack (TIA), and cerebral infarction without residual deficits: Secondary | ICD-10-CM | POA: Insufficient documentation

## 2017-06-24 DIAGNOSIS — E782 Mixed hyperlipidemia: Secondary | ICD-10-CM

## 2017-06-24 DIAGNOSIS — Z794 Long term (current) use of insulin: Secondary | ICD-10-CM

## 2017-07-21 ENCOUNTER — Other Ambulatory Visit: Payer: Self-pay | Admitting: Student in an Organized Health Care Education/Training Program

## 2017-07-21 ENCOUNTER — Other Ambulatory Visit: Payer: Self-pay

## 2017-07-21 ENCOUNTER — Ambulatory Visit
Admission: RE | Admit: 2017-07-21 | Discharge: 2017-07-21 | Disposition: A | Discharge status: Home / Self Care | Payer: Medicare Other | Source: Ambulatory Visit | Attending: Student in an Organized Health Care Education/Training Program | Admitting: Student in an Organized Health Care Education/Training Program

## 2017-07-21 ENCOUNTER — Ambulatory Visit
Payer: Medicare Other | Attending: Student in an Organized Health Care Education/Training Program | Admitting: Student in an Organized Health Care Education/Training Program

## 2017-07-21 ENCOUNTER — Encounter: Payer: Self-pay | Admitting: Student in an Organized Health Care Education/Training Program

## 2017-07-21 VITALS — BP 112/51 | HR 50 | Temp 98.1°F | Resp 16 | Ht 65.0 in | Wt 173.0 lb

## 2017-07-21 DIAGNOSIS — M47818 Spondylosis without myelopathy or radiculopathy, sacral and sacrococcygeal region: Secondary | ICD-10-CM | POA: Insufficient documentation

## 2017-07-21 DIAGNOSIS — Z96653 Presence of artificial knee joint, bilateral: Secondary | ICD-10-CM | POA: Diagnosis not present

## 2017-07-21 DIAGNOSIS — R32 Unspecified urinary incontinence: Secondary | ICD-10-CM | POA: Insufficient documentation

## 2017-07-21 DIAGNOSIS — H04129 Dry eye syndrome of unspecified lacrimal gland: Secondary | ICD-10-CM | POA: Diagnosis not present

## 2017-07-21 DIAGNOSIS — M47817 Spondylosis without myelopathy or radiculopathy, lumbosacral region: Secondary | ICD-10-CM

## 2017-07-21 DIAGNOSIS — E1165 Type 2 diabetes mellitus with hyperglycemia: Secondary | ICD-10-CM | POA: Insufficient documentation

## 2017-07-21 DIAGNOSIS — M47816 Spondylosis without myelopathy or radiculopathy, lumbar region: Secondary | ICD-10-CM | POA: Diagnosis not present

## 2017-07-21 DIAGNOSIS — G4733 Obstructive sleep apnea (adult) (pediatric): Secondary | ICD-10-CM | POA: Diagnosis not present

## 2017-07-21 DIAGNOSIS — M25562 Pain in left knee: Secondary | ICD-10-CM | POA: Insufficient documentation

## 2017-07-21 DIAGNOSIS — E039 Hypothyroidism, unspecified: Secondary | ICD-10-CM | POA: Diagnosis not present

## 2017-07-21 DIAGNOSIS — M25561 Pain in right knee: Secondary | ICD-10-CM

## 2017-07-21 DIAGNOSIS — F1120 Opioid dependence, uncomplicated: Secondary | ICD-10-CM | POA: Insufficient documentation

## 2017-07-21 DIAGNOSIS — F0280 Dementia in other diseases classified elsewhere without behavioral disturbance: Secondary | ICD-10-CM | POA: Insufficient documentation

## 2017-07-21 DIAGNOSIS — M5136 Other intervertebral disc degeneration, lumbar region: Secondary | ICD-10-CM | POA: Diagnosis not present

## 2017-07-21 DIAGNOSIS — H409 Unspecified glaucoma: Secondary | ICD-10-CM | POA: Insufficient documentation

## 2017-07-21 DIAGNOSIS — Z794 Long term (current) use of insulin: Secondary | ICD-10-CM | POA: Diagnosis not present

## 2017-07-21 DIAGNOSIS — I639 Cerebral infarction, unspecified: Secondary | ICD-10-CM

## 2017-07-21 DIAGNOSIS — Z7902 Long term (current) use of antithrombotics/antiplatelets: Secondary | ICD-10-CM | POA: Insufficient documentation

## 2017-07-21 DIAGNOSIS — R269 Unspecified abnormalities of gait and mobility: Secondary | ICD-10-CM | POA: Insufficient documentation

## 2017-07-21 DIAGNOSIS — K8020 Calculus of gallbladder without cholecystitis without obstruction: Secondary | ICD-10-CM | POA: Insufficient documentation

## 2017-07-21 DIAGNOSIS — I69898 Other sequelae of other cerebrovascular disease: Secondary | ICD-10-CM | POA: Insufficient documentation

## 2017-07-21 DIAGNOSIS — M419 Scoliosis, unspecified: Secondary | ICD-10-CM | POA: Insufficient documentation

## 2017-07-21 DIAGNOSIS — M1990 Unspecified osteoarthritis, unspecified site: Secondary | ICD-10-CM | POA: Insufficient documentation

## 2017-07-21 DIAGNOSIS — Z79899 Other long term (current) drug therapy: Secondary | ICD-10-CM | POA: Insufficient documentation

## 2017-07-21 DIAGNOSIS — K219 Gastro-esophageal reflux disease without esophagitis: Secondary | ICD-10-CM | POA: Insufficient documentation

## 2017-07-21 DIAGNOSIS — Z7982 Long term (current) use of aspirin: Secondary | ICD-10-CM | POA: Diagnosis not present

## 2017-07-21 DIAGNOSIS — N400 Enlarged prostate without lower urinary tract symptoms: Secondary | ICD-10-CM | POA: Insufficient documentation

## 2017-07-21 DIAGNOSIS — Z7901 Long term (current) use of anticoagulants: Secondary | ICD-10-CM

## 2017-07-21 DIAGNOSIS — M797 Fibromyalgia: Secondary | ICD-10-CM | POA: Insufficient documentation

## 2017-07-21 DIAGNOSIS — R4781 Slurred speech: Secondary | ICD-10-CM | POA: Diagnosis not present

## 2017-07-21 DIAGNOSIS — H401190 Primary open-angle glaucoma, unspecified eye, stage unspecified: Secondary | ICD-10-CM | POA: Insufficient documentation

## 2017-07-21 DIAGNOSIS — I700 Atherosclerosis of aorta: Secondary | ICD-10-CM | POA: Insufficient documentation

## 2017-07-21 DIAGNOSIS — G8929 Other chronic pain: Secondary | ICD-10-CM | POA: Diagnosis not present

## 2017-07-21 DIAGNOSIS — G894 Chronic pain syndrome: Secondary | ICD-10-CM

## 2017-07-21 DIAGNOSIS — E113299 Type 2 diabetes mellitus with mild nonproliferative diabetic retinopathy without macular edema, unspecified eye: Secondary | ICD-10-CM | POA: Diagnosis not present

## 2017-07-21 DIAGNOSIS — R9431 Abnormal electrocardiogram [ECG] [EKG]: Secondary | ICD-10-CM | POA: Diagnosis not present

## 2017-07-21 DIAGNOSIS — M48061 Spinal stenosis, lumbar region without neurogenic claudication: Secondary | ICD-10-CM | POA: Insufficient documentation

## 2017-07-21 DIAGNOSIS — E1169 Type 2 diabetes mellitus with other specified complication: Secondary | ICD-10-CM | POA: Insufficient documentation

## 2017-07-21 DIAGNOSIS — E785 Hyperlipidemia, unspecified: Secondary | ICD-10-CM | POA: Diagnosis not present

## 2017-07-21 DIAGNOSIS — F09 Unspecified mental disorder due to known physiological condition: Secondary | ICD-10-CM | POA: Diagnosis not present

## 2017-07-21 DIAGNOSIS — I10 Essential (primary) hypertension: Secondary | ICD-10-CM | POA: Insufficient documentation

## 2017-07-21 DIAGNOSIS — Z87891 Personal history of nicotine dependence: Secondary | ICD-10-CM | POA: Insufficient documentation

## 2017-07-21 DIAGNOSIS — E1140 Type 2 diabetes mellitus with diabetic neuropathy, unspecified: Secondary | ICD-10-CM | POA: Insufficient documentation

## 2017-07-21 NOTE — Progress Notes (Signed)
Patient's Name: David Sloan  MRN: [id number]  Referring Provider: Baxter Hire, MD  DOB: 04-17-1941  PCP: Harlow Ohms, MD  DOS: 07/21/2017  Note by: Gillis Santa, MD  Service setting: Ambulatory outpatient  Specialty: Interventional Pain Management  Location: [facility] (AMB) Pain Management Facility  Visit type: Initial Patient Evaluation  Patient type: New Patient   Primary Reason(s) for Visit: Encounter for initial evaluation of one or more chronic problems (new to examiner) potentially causing chronic pain, and posing a threat to normal musculoskeletal function. (Level of risk: High) CC: Knee Pain (left) and Back Pain (lower, right side)  HPI  David Sloan is a 77 y.o. year old, male patient, who comes today to see [place] for the first time for an initial evaluation of his chronic pain. He has Lower extremity weakness; Cerebral infarction ([place]); Elevated troponin; Uncontrolled type 2 diabetes mellitus with circulatory disorder ([place]); Hyperlipidemia; EKG abnormalities; Emesis; Gall bladder disease; Slurred speech; Elevated transaminase level; Gallstones; Type 2 diabetes mellitus ([place]); Dry eye syndrome; Cerebrovascular accident (CVA) ([place]); Lumbar and sacral osteoarthritis; Arthralgia, sacroiliac; Corneal changes, senile; Scoliosis; Neuralgia neuritis, sciatic nerve; Punctate keratitis; Primary open angle glaucoma; Other long term (current) drug therapy; Arthritis, degenerative; Obstructive apnea; Fibrositis; Glaucoma; Degeneration of intervertebral disc of lumbar region; Adult hypothyroidism; Acid reflux; Lumbar and sacral arthritis; Essential (primary) hypertension; Esophageal stenosis; Can't get food down; 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 Today he comes in for evaluation of his Knee Pain (left) and Back Pain (lower, right side)  Pain Assessment: Location: Left Knee Radiating: denies Onset: More than a month ago Duration: Chronic pain Quality: Nagging, Constant Severity: 8 /10 (self-reported pain score)  Note: Reported level is inconsistent with clinical observations. Clinically the patient looks like a 2/10 A 2/10 is viewed as "Mild to Moderate" and described as noticeable and distracting. Impossible to hide from other people. More frequent flare-ups. Still possible to adapt and function close to normal. It can be very annoying and may have occasional stronger flare-ups. With discipline, patients may get used to it and adapt.       When using our objective Pain Scale, levels between 6 and 10/10 are said to belong in an emergency room, as it progressively worsens from a 6/10, described as severely limiting, requiring emergency care not usually available at an outpatient pain management facility. At a 6/10 level, communication becomes difficult and requires great effort. Assistance to reach the emergency department may be required. Facial flushing and profuse sweating along with potentially dangerous increases in heart rate and blood pressure will be evident. Effect on ADL: without cane, pt states he would fall r/t how painful it is to bare weight, pt states he does not travel or leave the house as much as he'd like to. Timing: Constant Modifying factors:    Onset and Duration: Present longer than 3 months Cause of pain: Unknown Severity: Getting worse, NAS-11 at its worse: 10/10, NAS-11 at its best: 8/10, NAS-11 now: 10/10 and NAS-11 on the average: 10/10 Timing: Not influenced by the time of the day and After a period of immobility Aggravating Factors: Bending, Prolonged sitting, Prolonged standing and Walking Alleviating Factors:  Medications Associated Problems: Constipation and Spasms Quality of Pain: Aching, Sharp, Throbbing and Tingling Previous  Examinations or Tests: The patient denies previous examinations or tests Previous Treatments: The patient denies previous treatments  The patient comes into the clinics today for the first time for a chronic pain management evaluation.   Patient comes in complaining of axial low back pain that radiates into bilateral buttocks.  Also endorses bilateral knee pain.  Patient has been on Percocet 10 mg twice daily to 3 times daily as needed.  He was previously being seen at the pain management clinic at [facility].  Due to travel distance, the patient wants to relocate his pain management care to [place].  Patient has a history of prior strokes resulting in left-sided weakness.  Patient has difficulty walking without a cane and is fairly weak on the left side.  His most recent prescription is for oxycodone 10 mg 3 times daily as needed, quantity 90, last fill 07/04/2017.  Patient is currently anticoagulated with Plavix given his history of stroke.  He has not a procedural candidate for this reason.  Today I took the time to provide the patient with information regarding my pain practice. The patient was informed that my practice is divided into two sections: an interventional pain management section, as well as a completely separate and distinct medication management section. I explained that I have procedure days for my interventional therapies, and evaluation days for follow-ups and medication management. Because of the amount of documentation required during both, they are kept separated. This means that there is the possibility that he may be scheduled for a procedure on one day, and medication management the next. I have also informed him that because of staffing and facility limitations, I no longer take patients for medication management only. To illustrate the reasons for this, I gave  the patient the example of surgeons, and how inappropriate it would be to refer a patient to his/her care, just to write for the post-surgical antibiotics on a surgery done by a different surgeon.   Because interventional pain management is my board-certified specialty, the patient was informed that joining my practice means that they are open to any and all interventional therapies. I made it clear that this does not mean that they will be forced to have any procedures done. What this means is that I believe interventional therapies to be essential part of the diagnosis and proper management of chronic pain conditions. Therefore, patients not interested in these interventional alternatives will be better served under the care of a different practitioner.  The patient was also made aware of my Comprehensive Pain Management Safety Guidelines where by joining my practice, they limit all of their nerve blocks and joint injections to those done by our practice, for as long as we are retained to manage their care.   Historic Controlled Substance Pharmacotherapy Review  PMP and historical list of controlled substances:  oxycodone 10 mg 3 times daily as needed, quantity 90, last fill 07/04/2017 MME/day: 45 mg/day Medications: The patient did not bring the medication(s) to the appointment, as requested in our "New Patient Package" Pharmacodynamics: Desired effects: Analgesia: The patient reports >50% benefit. Reported improvement in function: The patient reports medication allows him to accomplish basic ADLs. Clinically meaningful improvement in function (CMIF): Sustained CMIF goals met Perceived effectiveness: Described as relatively effective, allowing for increase in activities of daily living (ADL) Undesirable effects: Side-effects or Adverse reactions: None reported Historical Monitoring: The patient  reports that he does not use drugs. List of all UDS Test(s): No results found for: MDMA,  COCAINSCRNUR, PCPSCRNUR, [place], CANNABQUANT, THCU, [place] List of other Serum/Urine Drug Screening Test(s):  No results found for: AMPHSCRSER, BARBSCRSER, BENZOSCRSER, COCAINSCRSER, COCAINSCRNUR, PCPSCRSER, PCPQUANT, THCSCRSER, THCU, CANNABQUANT, OPIATESCRSER, OXYSCRSER, PROPOXSCRSER, ETH Historical Background Evaluation: [place] PMP: Six (6) year initial data search conducted.             [place] Department of public safety, offender search: Editor, commissioning Information) Non-contributory Risk Assessment Profile: Aberrant behavior: None observed or detected today Risk factors for fatal opioid overdose: None identified today Fatal overdose hazard ratio (HR): Calculation deferred Non-fatal overdose hazard ratio (HR): Calculation deferred Risk of opioid abuse or dependence: 0.7-3.0% with doses ? 36 MME/day and 6.1-26% with doses ? 120 MME/day. Substance use disorder (SUD) risk level: Low Opioid risk tool (ORT) (Total Score): 0 Opioid Risk Tool - 07/21/17 1154      Family History of Substance Abuse   Alcohol  Negative    Illegal Drugs  Negative    Rx Drugs  Negative      Personal History of Substance Abuse   Alcohol  Negative    Illegal Drugs  Negative    Rx Drugs  Negative      Age   Age between 67-45 years   No      History of Preadolescent Sexual Abuse   History of Preadolescent Sexual Abuse  Negative or Male      Psychological Disease   Psychological Disease  Negative    Depression  Negative      Total Score   Opioid Risk Tool Scoring  0    Opioid Risk Interpretation  Low Risk      ORT Scoring interpretation table:  Score <3 = Low Risk for SUD  Score between 4-7 = Moderate Risk for SUD  Score >8 = High Risk for Opioid Abuse   PHQ-2 Depression Scale:  Total score: 0  PHQ-2 Scoring interpretation table: (Score and probability of major depressive disorder)  Score 0 = No depression  Score 1 = 15.4% Probability  Score 2 = 21.1% Probability  Score 3 = 38.4% Probability  Score 4 = 45.5%  Probability  Score 5 = 56.4% Probability  Score 6 = 78.6% Probability   PHQ-9 Depression Scale:  Total score: 0  PHQ-9 Scoring interpretation table:  Score 0-4 = No depression  Score 5-9 = Mild depression  Score 10-14 = Moderate depression  Score 15-19 = Moderately severe depression  Score 20-27 = Severe depression (2.4 times higher risk of SUD and 2.89 times higher risk of overuse)   Pharmacologic Plan: As per protocol, I have not taken over any controlled substance management, pending the results of ordered tests and/or consults.            Initial impression: Pending review of available data and ordered tests.  Meds   Current Outpatient Medications:  .  amLODipine (NORVASC) 10 MG tablet, Take 1 tablet (10 mg total) by mouth daily., Disp: 30 tablet, Rfl: 0 .  aspirin 81 MG chewable tablet, Chew 1 tablet (81 mg total) by mouth daily., Disp: 30 tablet, Rfl: 0 .  atorvastatin (LIPITOR) 40 MG tablet, Take 1 tablet (40 mg total) by mouth daily at 6 PM., Disp: 30 tablet, Rfl: 0 .  Blood Glucose Monitoring Suppl (RA BLOOD GLUCOSE MONITOR) DEVI, Please provide ACCU-CHEK KIT, Disp: , Rfl:  .  clopidogrel (PLAVIX) 75 MG tablet, Take 1 tablet (75 mg total) by mouth daily., Disp: 30 tablet, Rfl: 0 .  finasteride (PROSCAR) 5 MG tablet, Take 5  mg by mouth daily., Disp: , Rfl:  .  insulin NPH Human (HUMULIN N) 100 UNIT/ML injection, 32 units before breakfast and 24 units before supper., Disp: 10 mL, Rfl: 11 .  losartan (COZAAR) 100 MG tablet, Take 100 mg by mouth daily., Disp: , Rfl:  .  oxybutynin (DITROPAN-XL) 5 MG 24 hr tablet, Take 5 mg by mouth at bedtime., Disp: , Rfl:  .  oxyCODONE-acetaminophen (PERCOCET) 10-325 MG tablet, Take by mouth 2 (two) times daily., Disp: , Rfl:  .  pantoprazole (PROTONIX) 40 MG tablet, Take 40 mg by mouth 2 (two) times daily., Disp: , Rfl:  .  tamsulosin (FLOMAX) 0.4 MG CAPS capsule, Take 0.4 mg by mouth daily., Disp: , Rfl:  .  vitamin B-12 (CYANOCOBALAMIN) 1000  MCG tablet, Take 1,000 mcg by mouth daily., Disp: , Rfl:  .  cloNIDine (CATAPRES) 0.1 MG tablet, Take 1 tablet (0.1 mg total) by mouth daily. (Patient not taking: Reported on 07/21/2017), Disp: 7 tablet, Rfl: 0 .  oxyCODONE-acetaminophen (PERCOCET) 7.5-325 MG tablet, Take 1 tablet by mouth every 6 (six) hours as needed for moderate pain. (Patient not taking: Reported on 07/21/2017), Disp: 30 tablet, Rfl: 0  Imaging Review  Hip-R DG 2-3 views:  Results for orders placed during the hospital encounter of 04/25/17  DG Hip Unilat W or Wo Pelvis 2-3 Views Right   Narrative CLINICAL DATA:  Fall  EXAM: DG HIP (WITH OR WITHOUT PELVIS) 2-3V RIGHT  COMPARISON:  04/23/2015  FINDINGS: Deformity of the right ischium is stable compatible with a chronic deformity. Osteopenia. No acute fracture or dislocation.  IMPRESSION: No acute bony pathology.   Electronically Signed   By: Marybelle Killings M.D.   On: 04/25/2017 13:18    Complexity Note: Imaging results reviewed. Results shared with Mr. Conaway, using Layman's terms.                         ROS  Cardiovascular History: Daily Aspirin intake, High blood pressure and Blood thinners:  Anticoagulant Pulmonary or Respiratory History: Temporary stoppage of breathing during sleep Neurological History: Stroke (Residual deficits or weakness: c/o knee pain, impaired mobility) Review of Past Neurological Studies:  Results for orders placed or performed during the hospital encounter of 10/02/15  MR Brain Wo Contrast   Narrative   CLINICAL DATA:  History of multiple falls and generalized weakness.  EXAM: MRI HEAD WITHOUT CONTRAST  MRA HEAD WITHOUT CONTRAST  TECHNIQUE: Multiplanar, multiecho pulse sequences of the brain and surrounding structures were obtained without intravenous contrast. Angiographic images of the head were obtained using MRA technique without contrast.  COMPARISON:  03/30/2015 brain MRI  FINDINGS: MRI HEAD FINDINGS  Calvarium  and upper cervical spine: No focal marrow signal abnormality. Upper cervical facet arthropathy and disc degeneration with C3-4 and C4-5 canal stenosis.  Orbits: Negative.  Sinuses and Mastoids: Clear.  Brain: 5 mm right subthalamic acute infarct, nonhemorrhagic.  Remote lacunar infarcts in the right thalamus, deep cerebral white matter tracts, right caudate body, and right pons. Remote small to moderate cortical infarcts in the left occipital pole and high parasagittal left frontal lobe. Few foci of chronic microhemorrhage without specific pattern, likely post ischemic. No hemorrhage, hydrocephalus, mass lesion, or evidence of major vessel occlusion  MRA HEAD FINDINGS  Symmetric carotid and vertebral arteries. Symmetric and unremarkable vertebrobasilar branching. Small of any communicating arteries.  Atherosclerotic type irregularity of the bilateral carotid siphons, with moderate stenosis at the right anterior genu. The  M1 and A1 segments are patent there is a high-grade distal left M1 segment stenosis. Focal apparent advanced narrowing of the lower branch right M2 vessel is likely combination of poor signal and atherosclerosis. No evidence of major branch occlusion.  Smooth and widely patent vertebral, basilar, and proximal posterior cerebral arteries. Beginning at the P3 segments there is atherosclerotic irregularity and narrowing  When accounting for infundibula at the ophthalmic origins no aneurysm is suspected.  IMPRESSION: 1. 5 mm acute right subthalamic infarct. 2. Extensive chronic ischemic injury as described above. 3. No acute arterial finding. 4. Intracranial atherosclerosis with high-grade left distal M1 and right M2 stenoses. 5. 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   Electronically Signed   By: Monte Fantasia M.D.   On: 10/03/2015 12:37   CT Head Wo Contrast   Narrative   CLINICAL DATA:  Left-sided  weakness.  EXAM: CT HEAD WITHOUT CONTRAST  TECHNIQUE: Contiguous axial images were obtained from the base of the skull through the vertex without intravenous contrast.  COMPARISON:  CT scan of March 30, 2015.  FINDINGS: Bony calvarium appears intact. Mild chronic ischemic white matter disease is noted. Left frontal encephalomalacia is noted consistent with old infarction. Left occipital encephalomalacia is also noted consistent with old infarction. No mass effect or midline shift is noted. Ventricular size is within normal limits. There is no evidence of mass lesion, hemorrhage or acute infarction.  IMPRESSION: Mild chronic ischemic white matter disease. 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 No acute intracranial abnormality seen.   Electronically Signed   By: Marijo Conception, M.D.   On: 10/02/2015 17:03    Psychological-Psychiatric History: No reported psychological or psychiatric signs or symptoms such as difficulty sleeping, anxiety, depression, delusions or hallucinations (schizophrenial), mood swings (bipolar disorders) or suicidal ideations or attempts Gastrointestinal History: Reflux or heatburn and Irregular, infrequent bowel movements (Constipation) Genitourinary History: No reported renal or genitourinary signs or symptoms such as difficulty voiding or producing urine, peeing blood, non-functioning kidney, kidney stones, difficulty emptying the bladder, difficulty controlling the flow of urine, or chronic kidney disease Hematological History: Brusing easily Endocrine History: High blood sugar requiring insulin (IDDM) Rheumatologic History: No reported rheumatological signs and symptoms such as fatigue, joint pain, tenderness, swelling, redness, heat, stiffness, decreased range of motion, with or without associated rash Musculoskeletal History: Negative for myasthenia gravis, muscular dystrophy, multiple sclerosis or malignant hyperthermia Work History:  Retired  Allergies  Mr. Hauk has No Known Allergies.  Laboratory Chemistry  Inflammation Markers (CRP: Acute Phase) (ESR: Chronic Phase) No results found for: CRP, ESRSEDRATE, LATICACIDVEN               Rheumatology Markers No results found for: RF, ANA, Therisa [place], [facility]              Renal Function Markers Lab Results  Component Value Date   BUN 16 10/02/2015   CREATININE 0.95 10/03/2015   GFRAA >60 10/03/2015   GFRNONAA >60 10/03/2015                 Hepatic Function Markers Lab Results  Component Value Date   AST 31 10/02/2015   ALT 24 10/02/2015   ALBUMIN 4.0 10/02/2015   ALKPHOS 58 10/02/2015   LIPASE 23 04/23/2015   AMMONIA 19 03/31/2015                 Electrolytes Lab Results  Component Value Date   NA 142 10/02/2015  K 3.4 (L) 10/02/2015   CL 106 10/02/2015   CALCIUM 9.8 10/02/2015                 Neuropathy Markers Lab Results  Component Value Date   HGBA1C 6.4 (H) 10/03/2015                 Bone Pathology Markers No results found for: VD25OH, [id number], [id number], [id number], 25OHVITD1, 25OHVITD2, 25OHVITD3, TESTOFREE, TESTOSTERONE               Coagulation Parameters Lab Results  Component Value Date   INR 1.32 03/30/2015   LABPROT 16.6 (H) 03/30/2015   APTT 32 03/30/2015   PLT 149 (L) 10/03/2015                 Cardiovascular Markers Lab Results  Component Value Date   CKTOTAL 237 (H) 05/06/2013   CKMB 3.6 05/06/2013   TROPONINI <0.03 10/02/2015   HGB 11.7 (L) 10/03/2015   HCT 33.9 (L) 10/03/2015                 CA Markers No results found for: CEA, CA125, LABCA2               Note: Lab results reviewed.  [place]  Drug: Mr. Aycock  reports that he does not use drugs. Alcohol:  reports that he does not drink alcohol. Tobacco:  reports that he has quit smoking. His smoking use included cigarettes. He has a 25.00 pack-year smoking history. he has never used smokeless tobacco. Medical:  has a past medical  history of Atypical chest pain, BPH (benign prostatic hyperplasia), Chronic back pain, Diabetes mellitus without complication ([place]), Diabetic neuropathy ([place]), Essential hypertension, Falls, GERD (gastroesophageal reflux disease), Memory disorder, OSA (obstructive sleep apnea), Osteoarthritis, Sinus bradycardia, Stroke ([place]), and Urinary incontinence. Family: family history includes Alzheimer's disease in his father; Diabetes in his mother.  Past Surgical History:  Procedure Laterality Date  . JOINT REPLACEMENT     bilateral knee replacements   Active Ambulatory Problems    Diagnosis Date Noted  . Lower extremity weakness 03/30/2015  . Cerebral infarction ([place])   . Elevated troponin   . Uncontrolled type 2 diabetes mellitus with circulatory disorder ([place])   . Hyperlipidemia   . EKG abnormalities   . Emesis   . Gall bladder disease   . Slurred speech 03/31/2015  . Elevated transaminase level 03/31/2015  . Gallstones 03/31/2015  . Type 2 diabetes mellitus ([place]) 10/16/2010  . Dry eye syndrome 10/16/2010  . Cerebrovascular accident (CVA) ([place]) 05/19/2013  . Lumbar and sacral osteoarthritis 01/11/2013  . Arthralgia, sacroiliac 10/06/2012  . Corneal changes, senile 10/16/2010  . Scoliosis 04/13/2015  . Neuralgia neuritis, sciatic nerve 09/15/2014  . Punctate keratitis 01/21/2012  . Primary open angle glaucoma 10/16/2010  . Other long term (current) drug therapy 02/13/2015  . Arthritis, degenerative 10/06/2012  . Obstructive apnea 10/06/2012  . Fibrositis 04/13/2015  . Glaucoma 10/16/2010  . Degeneration of intervertebral disc of lumbar region 10/06/2012  . Adult hypothyroidism 05/19/2013  . Acid reflux 05/19/2013  . Lumbar and sacral arthritis 10/06/2012  . Essential (primary) hypertension 09/18/2014  . Esophageal stenosis 11/30/2013  . Can't get food down 11/30/2013  . 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[place]) 01/21/2012  . Diabetes mellitus ([place]) 10/06/2012  . Cognitive disorder 04/05/2014  .  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 Chronic pain associated with significant psychosocial dysfunction 01/17/2014  . Bradycardia 09/13/2014  . Nonproliferative diabetic retinopathy ([place]) 10/16/2010  .  Back ache 01/17/2014  . Fall 10/03/2015  . Gait instability 10/03/2015  . TIA (transient ischemic attack) 10/03/2015  . Acute CVA (cerebrovascular accident) ([place]) 10/03/2015  . CVA (cerebral infarction) 10/03/2015   Resolved Ambulatory Problems    Diagnosis Date Noted  . No Resolved Ambulatory Problems   Past Medical History:  Diagnosis Date  . Atypical chest pain   . BPH (benign prostatic hyperplasia)   . Chronic back pain   . Diabetes mellitus without complication ([place])   . Diabetic neuropathy ([place])   . Essential hypertension   . Falls   . GERD (gastroesophageal reflux disease)   . Memory disorder   . OSA (obstructive sleep apnea)   . Osteoarthritis   . Sinus bradycardia   . Stroke ([place])   . Urinary incontinence    Constitutional Exam  General appearance: Well nourished, well developed, and well hydrated. In no apparent acute distress Vitals:   07/21/17 1129  BP: (!) 112/51  Pulse: (!) 50  Resp: 16  Temp: 98.1 F (36.7 C)  TempSrc: Oral  SpO2: 99%  Weight: 173 lb (78.5 kg)  Height: '5\' 5"'  (1.651 m)   BMI Assessment: Estimated body mass index is 28.79 kg/m as calculated from the following:   Height as of this encounter: '5\' 5"'  (1.651 m).   Weight as of this encounter: 173 lb (78.5 kg).  BMI interpretation table: BMI level Category Range association with higher incidence of chronic pain  <18 kg/m2 Underweight   18.5-24.9 kg/m2 Ideal body weight   25-29.9 kg/m2 Overweight Increased incidence by 20%  30-34.9 kg/m2 Obese (Class I) Increased incidence by 68%  35-39.9 kg/m2 Severe obesity (Class II) Increased incidence by 136%  >40 kg/m2 Extreme obesity (Class III) Increased incidence by 254%   BMI Readings from Last 4 Encounters:  07/21/17 28.79 kg/m   04/25/17 29.38 kg/m  10/03/15 29.21 kg/m  05/28/15 32.77 kg/m   Wt Readings from Last 4 Encounters:  07/21/17 173 lb (78.5 kg)  04/25/17 182 lb (82.6 kg)  10/03/15 181 lb (82.1 kg)  05/28/15 203 lb (92.1 kg)  Psych/Mental status: Alert, oriented x 3 (person, place, & time)       Eyes: PERLA Respiratory: No evidence of acute respiratory distress  Cervical Spine Area Exam  Skin & Axial Inspection: No masses, redness, edema, swelling, or associated skin lesions Alignment: Symmetrical Functional ROM: Unrestricted ROM      Stability: No instability detected Muscle Tone/Strength: Functionally intact. No obvious neuro-muscular anomalies detected. Sensory (Neurological): Unimpaired Palpation: No palpable anomalies              Upper Extremity (UE) Exam    Side: Right upper extremity  Side: Left upper extremity  Skin & Extremity Inspection: Skin color, temperature, and hair growth are WNL. No peripheral edema or cyanosis. No masses, redness, swelling, asymmetry, or associated skin lesions. No contractures.  Skin & Extremity Inspection: Skin color, temperature, and hair growth are WNL. No peripheral edema or cyanosis. No masses, redness, swelling, asymmetry, or associated skin lesions. No contractures.  Functional ROM: Unrestricted ROM          Functional ROM: Unrestricted ROM          Muscle Tone/Strength: Functionally intact. No obvious neuro-muscular anomalies detected.  Muscle Tone/Strength: Movement possible against some resistance (4/5)  Sensory (Neurological): Unimpaired          Sensory (Neurological): Unimpaired          Palpation: No palpable anomalies  Palpation: No palpable anomalies              Specialized Test(s): Deferred         Specialized Test(s): Deferred          Thoracic Spine Area Exam  Skin & Axial Inspection: No masses, redness, or swelling Alignment: Symmetrical Functional ROM: Unrestricted ROM Stability: No instability detected Muscle  Tone/Strength: Functionally intact. No obvious neuro-muscular anomalies detected. Sensory (Neurological): Unimpaired Muscle strength & Tone: No palpable anomalies  Lumbar Spine Area Exam  Skin & Axial Inspection: No masses, redness, or swelling Alignment: Symmetrical Functional ROM: Unrestricted ROM      Stability: No instability detected Muscle Tone/Strength: Functionally intact. No obvious neuro-muscular anomalies detected. Sensory (Neurological): Unimpaired Palpation: No palpable anomalies       Provocative Tests: Lumbar Hyperextension and rotation test: evaluation deferred today       Lumbar Lateral bending test: evaluation deferred today       Patrick's Maneuver: evaluation deferred today                    Gait & Posture Assessment  Ambulation: Patient came in today in a wheel chair Gait: Very limited, using assistive device to ambulate Posture: Difficulty with positional changes   Lower Extremity Exam    Side: Right lower extremity  Side: Left lower extremity  Skin & Extremity Inspection: Skin color, temperature, and hair growth are WNL. No peripheral edema or cyanosis. No masses, redness, swelling, asymmetry, or associated skin lesions. No contractures.  Skin & Extremity Inspection: Atrophy  Functional ROM: Unrestricted ROM          Functional ROM: Diminished ROM          Muscle Tone/Strength: Functionally intact. No obvious neuro-muscular anomalies detected.  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 Diagnoses of Chronic pain associated with significant psychosocial dysfunction, Continuous opioid dependence ([place]), Cerebral infarction, unspecified mechanism ([place]), Lumbar degenerative  disc disease, Arthralgia of both knees, Chronic pain syndrome, and Anticoagulated were also pertinent to this visit.  Visit Diagnosis (New problems to examiner): 1. Lumbar and sacral osteoarthritis   2. Chronic pain associated with significant psychosocial dysfunction   3. Continuous opioid dependence ([place])   4. Cerebral infarction, unspecified mechanism ([place])   5. Lumbar degenerative disc disease   6. Arthralgia of both knees   7. Chronic pain syndrome   8. Anticoagulated   General Recommendations: The pain condition that the patient suffers from is best treated with a multidisciplinary approach that involves an increase in physical activity to prevent de-conditioning and worsening of the pain cycle, as well as psychological counseling (formal and/or informal) to address the co-morbid psychological affects of pain. Treatment will often involve judicious use of pain medications and interventional procedures to decrease the pain, allowing the patient to participate in the physical activity that will ultimately produce long-lasting pain reductions. The goal of the multidisciplinary approach is to return the patient to a higher level of overall function and to restore their ability to perform activities of daily living.  77 year old male with a history of axial low back pain that radiates into his bilateral buttocks, bilateral knee pain secondary to degenerative disc disease of the lumbar spine, lumbar facet arthritis, central spinal canal stenosis, multilevel foraminal stenosis.  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  Patient is chronically anticoagulated with Plavix and therefore will want to avoid injections unless he is having significant pain flare.  His opioid therapy  includes oxycodone 10 mg 3 times daily as needed, quantity 90.  I had a discussion with the patient about trying to reduce his intake and the patient is agreeable to this.  Patient  has tried Butrans in the past which was not effective for his pain and he had difficulty applying the patch.  He also had difficulty with compliance and given his cognitive issues sometimes would apply 2 patches at a time.  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  He has trouble recalling his past medical history along with medication refills in the past.  I will also obtain imaging of the patient's lumbar spine, SI joints and, bilateral knees.  Today I will obtain a urine drug screen.  I will have the patient follow with me next week.  Pending urine drug screen I will consider taking over his opioid regimen.  We discussed hydrocodone 10 mg 3 times daily as needed or oxycodone 10 mg twice daily as needed.  I will have further discussion with the patient next week.    Previous Interventions (at [facility]) Medial branch nerve blocks, L RFA lower back, R RFA lower back, TPIs,     Ordered Lab-work, Procedure(s), Referral(s), & Consult(s): Orders Placed This Encounter  Procedures  . DG Lumbar Spine Complete W/Bend  . DG Si Joints  . DG Knee 1-2 Views Left  . DG Knee 1-2 Views Right  . Compliance Drug Analysis, Ur    Provider-requested follow-up: Return in about 7 days (around 07/28/2017).  Future Appointments  Date Time Provider [place]  07/30/2017 10:45 AM Gillis Santa, MD [facility] None    Primary Care Physician: Harlow Ohms, MD Location: [facility] Note by: Gillis Santa, M.D, Date: 07/21/2017; Time: 2:50 PM  There are no Patient Instructions on file for this visit.

## 2017-07-21 NOTE — Progress Notes (Signed)
Safety precautions to be maintained throughout the outpatient stay will include: orient to surroundings, keep bed in low position, maintain call bell within reach at all times, provide assistance with transfer out of bed and ambulation.  

## 2017-07-25 LAB — COMPLIANCE DRUG ANALYSIS, UR

## 2017-07-30 ENCOUNTER — Ambulatory Visit
Payer: Medicare Other | Attending: Student in an Organized Health Care Education/Training Program | Admitting: Student in an Organized Health Care Education/Training Program

## 2017-07-30 ENCOUNTER — Encounter: Payer: Self-pay | Admitting: Student in an Organized Health Care Education/Training Program

## 2017-07-30 VITALS — BP 170/68 | HR 47 | Temp 98.3°F | Resp 16 | Ht 65.0 in | Wt 173.0 lb

## 2017-07-30 DIAGNOSIS — I639 Cerebral infarction, unspecified: Secondary | ICD-10-CM | POA: Insufficient documentation

## 2017-07-30 DIAGNOSIS — R4781 Slurred speech: Secondary | ICD-10-CM | POA: Insufficient documentation

## 2017-07-30 DIAGNOSIS — R740 Nonspecific elevation of levels of transaminase and lactic acid dehydrogenase [LDH]: Secondary | ICD-10-CM | POA: Insufficient documentation

## 2017-07-30 DIAGNOSIS — F1120 Opioid dependence, uncomplicated: Secondary | ICD-10-CM | POA: Diagnosis not present

## 2017-07-30 DIAGNOSIS — Z7901 Long term (current) use of anticoagulants: Secondary | ICD-10-CM | POA: Insufficient documentation

## 2017-07-30 DIAGNOSIS — N400 Enlarged prostate without lower urinary tract symptoms: Secondary | ICD-10-CM | POA: Insufficient documentation

## 2017-07-30 DIAGNOSIS — H409 Unspecified glaucoma: Secondary | ICD-10-CM | POA: Insufficient documentation

## 2017-07-30 DIAGNOSIS — E785 Hyperlipidemia, unspecified: Secondary | ICD-10-CM | POA: Insufficient documentation

## 2017-07-30 DIAGNOSIS — Z79891 Long term (current) use of opiate analgesic: Secondary | ICD-10-CM | POA: Diagnosis not present

## 2017-07-30 DIAGNOSIS — I700 Atherosclerosis of aorta: Secondary | ICD-10-CM | POA: Diagnosis not present

## 2017-07-30 DIAGNOSIS — G894 Chronic pain syndrome: Secondary | ICD-10-CM | POA: Insufficient documentation

## 2017-07-30 DIAGNOSIS — M170 Bilateral primary osteoarthritis of knee: Secondary | ICD-10-CM | POA: Insufficient documentation

## 2017-07-30 DIAGNOSIS — E039 Hypothyroidism, unspecified: Secondary | ICD-10-CM | POA: Insufficient documentation

## 2017-07-30 DIAGNOSIS — Z96653 Presence of artificial knee joint, bilateral: Secondary | ICD-10-CM | POA: Diagnosis not present

## 2017-07-30 DIAGNOSIS — K222 Esophageal obstruction: Secondary | ICD-10-CM | POA: Diagnosis not present

## 2017-07-30 DIAGNOSIS — M47817 Spondylosis without myelopathy or radiculopathy, lumbosacral region: Secondary | ICD-10-CM | POA: Diagnosis not present

## 2017-07-30 DIAGNOSIS — M5136 Other intervertebral disc degeneration, lumbar region: Secondary | ICD-10-CM | POA: Insufficient documentation

## 2017-07-30 DIAGNOSIS — M25562 Pain in left knee: Secondary | ICD-10-CM | POA: Diagnosis not present

## 2017-07-30 DIAGNOSIS — E1159 Type 2 diabetes mellitus with other circulatory complications: Secondary | ICD-10-CM | POA: Diagnosis not present

## 2017-07-30 DIAGNOSIS — M25561 Pain in right knee: Secondary | ICD-10-CM

## 2017-07-30 DIAGNOSIS — Z7982 Long term (current) use of aspirin: Secondary | ICD-10-CM | POA: Insufficient documentation

## 2017-07-30 DIAGNOSIS — M797 Fibromyalgia: Secondary | ICD-10-CM | POA: Diagnosis not present

## 2017-07-30 DIAGNOSIS — Z79899 Other long term (current) drug therapy: Secondary | ICD-10-CM | POA: Diagnosis not present

## 2017-07-30 DIAGNOSIS — Z820 Family history of epilepsy and other diseases of the nervous system: Secondary | ICD-10-CM | POA: Insufficient documentation

## 2017-07-30 DIAGNOSIS — G4733 Obstructive sleep apnea (adult) (pediatric): Secondary | ICD-10-CM | POA: Diagnosis not present

## 2017-07-30 DIAGNOSIS — Z87891 Personal history of nicotine dependence: Secondary | ICD-10-CM | POA: Insufficient documentation

## 2017-07-30 DIAGNOSIS — E1140 Type 2 diabetes mellitus with diabetic neuropathy, unspecified: Secondary | ICD-10-CM | POA: Insufficient documentation

## 2017-07-30 DIAGNOSIS — K219 Gastro-esophageal reflux disease without esophagitis: Secondary | ICD-10-CM | POA: Diagnosis not present

## 2017-07-30 DIAGNOSIS — R0789 Other chest pain: Secondary | ICD-10-CM | POA: Insufficient documentation

## 2017-07-30 DIAGNOSIS — Z833 Family history of diabetes mellitus: Secondary | ICD-10-CM | POA: Insufficient documentation

## 2017-07-30 DIAGNOSIS — R9431 Abnormal electrocardiogram [ECG] [EKG]: Secondary | ICD-10-CM | POA: Diagnosis not present

## 2017-07-30 MED ORDER — HYDROCODONE-ACETAMINOPHEN 7.5-325 MG PO TABS: 1.0000 | ORAL_TABLET | Freq: Two times a day (BID) | ORAL | 0 refills | Status: DC | PRN

## 2017-07-30 NOTE — Patient Instructions (Signed)
1.  Sign opioid contract 2.  Prescription for hydrocodone for 2 months 3.  Follow with Crystal for medication management in 2 months.

## 2017-07-30 NOTE — Progress Notes (Signed)
Patient's Name: David Sloan  MRN: [id number]  Referring Provider: Harlow Ohms, MD  DOB: 02/18/1941  PCP: Harlow Ohms, MD  DOS: 07/30/2017  Note by: Gillis Santa, MD  Service setting: Ambulatory outpatient  Specialty: Interventional Pain Management  Location: [facility] (AMB) Pain Management Facility    Patient type: Established   Primary Reason(s) for Visit: Encounter for prescription drug management. (Level of risk: moderate)  CC: Back Pain (lower) and Knee Pain (left)  HPI  Mr. Purohit is a 77 y.o. year old, male patient, who comes today for a medication management evaluation. He has Lower extremity weakness; Cerebral infarction ([place]); Elevated troponin; Uncontrolled type 2 diabetes mellitus with circulatory disorder ([place]); Hyperlipidemia; EKG abnormalities; Emesis; Gall bladder disease; Slurred speech; Elevated transaminase level; Gallstones; Type 2 diabetes mellitus ([place]); Dry eye syndrome; Cerebrovascular accident (CVA) ([place]); Lumbar and sacral osteoarthritis; Arthralgia, sacroiliac; Corneal changes, senile; Scoliosis; Neuralgia neuritis, sciatic nerve; Punctate keratitis; Primary open angle glaucoma; Other long term (current) drug therapy; Arthritis, degenerative; Obstructive apnea; Fibrositis; Glaucoma; Degeneration of intervertebral disc of lumbar region; Adult hypothyroidism; Acid reflux; Lumbar and sacral arthritis; Essential (primary) hypertension; Esophageal stenosis; Can't get food down; 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 His primarily concern today is the Back Pain (lower) and Knee Pain (left)  Pain Assessment: Location: Lower Back(left  knee) Radiating: denies Onset: More than a month ago Duration: Chronic pain Quality: Constant Severity: 8 /10 (self-reported pain score)  Note: Reported level is inconsistent with clinical observations. Clinically the patient looks like a 3/10 A 3/10 is viewed as "Moderate" and described as significantly interfering with activities of daily living (ADL). It becomes difficult to feed, bathe, get dressed, get on and off the toilet or to perform personal hygiene functions. Difficult to get in and out of bed or a chair without assistance. Very distracting. With effort, it can be ignored when deeply involved in activities.       When using our objective Pain Scale, levels between 6 and 10/10 are said to belong in an emergency room, as it progressively worsens from a 6/10, described as severely limiting, requiring emergency care not usually available at an outpatient pain management facility. At a 6/10 level, communication becomes difficult and requires great effort. Assistance to reach the emergency department may be required. Facial flushing and profuse sweating along with potentially dangerous increases in heart rate and blood pressure will be evident. Effect on ADL: exercising Timing: Constant Modifying factors:    Mr. Kitchings was last scheduled for an appointment on 07/21/2017 for medication management. During today's appointment we reviewed Mr. Mcfarlane's chronic pain status, as well as his outpatient medication regimen.  The patient  reports that he does not use drugs. His body mass index is 28.79 kg/m.  Further details on both, my assessment(s), as well as the proposed treatment plan, please see below.  Controlled Substance Pharmacotherapy Assessment REMS (Risk Evaluation and Mitigation Strategy)  Analgesic: Hydrocodone 7.5 mg twice daily as needed, quantity 19-month  We will initiate this today.  This is a dose reduction from his previous regimen of oxycodone 10 mg 3 times daily as needed MME/day:  15 mg/day.  WLandis Martins RN  07/30/2017 11:22 AM  Sign at close encounter Safety precautions to be maintained throughout  the outpatient stay will include: orient to surroundings, keep bed in low position, maintain call bell within reach at all times, provide assistance with transfer out of bed and ambulation.    Pharmacokinetics: Liberation and absorption (onset of action): WNL Distribution (time to peak effect): WNL Metabolism and excretion (duration of action): WNL         Pharmacodynamics: Desired effects: Analgesia: Mr. Devino reports >50% benefit. Functional ability: Patient reports that medication allows him to accomplish basic ADLs Clinically meaningful improvement in function (CMIF): Sustained CMIF goals met Perceived effectiveness: Described as relatively effective, allowing for increase in activities of daily living (ADL) Undesirable effects: Side-effects or Adverse reactions: None reported Monitoring: [place] PMP: Online review of the past 104-monthperiod conducted. Compliant with practice rules and regulations Last UDS on record: Summary  Date Value Ref Range Status  07/21/2017 FINAL  Final    Comment:    ==================================================================== TOXASSURE COMP DRUG ANALYSIS,UR ==================================================================== Test                             Result       Flag       Units Drug Present and Declared for Prescription Verification   Oxycodone                      1059         EXPECTED   ng/mg creat   Oxymorphone                    1116         EXPECTED   ng/mg creat   Noroxycodone                   1964         EXPECTED   ng/mg creat   Noroxymorphone                 599          EXPECTED   ng/mg creat    Sources of oxycodone are scheduled prescription medications.    Oxymorphone, noroxycodone, and noroxymorphone are expected    metabolites of oxycodone. Oxymorphone is also available as a    scheduled prescription  medication.   Acetaminophen                  PRESENT      EXPECTED Drug Absent but Declared for Prescription Verification   Salicylate                     Not Detected UNEXPECTED    Aspirin, as indicated in the declared medication list, is not    always detected even when used as directed.   Clonidine                      Not Detected UNEXPECTED ==================================================================== Test                      Result    Flag   Units      Ref Range   Creatinine              157              mg/dL      >=20 ==================================================================== Declared Medications:  The flagging and interpretation on this report are based on the  following declared medications.  Unexpected  results may arise from  inaccuracies in the declared medications.  **Note: The testing scope of this panel includes these medications:  Clonidine (Catapres)  Oxycodone (Percocet)  **Note: The testing scope of this panel does not include small to  moderate amounts of these reported medications:  Acetaminophen (Percocet)  Aspirin (Aspirin 81)  **Note: The testing scope of this panel does not include following  reported medications:  Amlodipine (Norvasc)  Atorvastatin  Clopidogrel (Plavix)  Cyanocobalamin  Finasteride (Proscar)  Insulin  Losartan (Losartan Potassium)  Oxybutynin  Pantoprazole (Protonix)  Tamsulosin (Flomax) ==================================================================== For clinical consultation, please call [phone]. ====================================================================    UDS interpretation: Compliant          Medication Assessment Form: Reviewed. Patient indicates being compliant with therapy Treatment compliance: Compliant Risk Assessment Profile: Aberrant behavior: See prior evaluations. None observed or detected today Comorbid factors increasing risk of overdose: See prior notes. No additional risks  detected today Risk of substance use disorder (SUD): Low Opioid Risk Tool - 07/21/17 1154      Family History of Substance Abuse   Alcohol  Negative    Illegal Drugs  Negative    Rx Drugs  Negative      Personal History of Substance Abuse   Alcohol  Negative    Illegal Drugs  Negative    Rx Drugs  Negative      Age   Age between 60-45 years   No      History of Preadolescent Sexual Abuse   History of Preadolescent Sexual Abuse  Negative or Male      Psychological Disease   Psychological Disease  Negative    Depression  Negative      Total Score   Opioid Risk Tool Scoring  0    Opioid Risk Interpretation  Low Risk      ORT Scoring interpretation table:  Score <3 = Low Risk for SUD  Score between 4-7 = Moderate Risk for SUD  Score >8 = High Risk for Opioid Abuse   Risk Mitigation Strategies:  Patient Counseling: Covered Patient-Prescriber Agreement (PPA): Present and active  Notification to other healthcare providers: Done  Pharmacologic Plan: No change in therapy, at this time.             Laboratory Chemistry  Inflammation Markers (CRP: Acute Phase) (ESR: Chronic Phase) No results found for: CRP, ESRSEDRATE, LATICACIDVEN                       Rheumatology Markers No results found for: RF, ANA, Therisa Doy[place], [facility]              Renal Function Markers Lab Results  Component Value Date   BUN 16 10/02/2015   CREATININE 0.95 10/03/2015   GFRAA >60 10/03/2015   GFRNONAA >60 10/03/2015                 Hepatic Function Markers Lab Results  Component Value Date   AST 31 10/02/2015   ALT 24 10/02/2015   ALBUMIN 4.0 10/02/2015   ALKPHOS 58 10/02/2015   LIPASE 23 04/23/2015   AMMONIA 19 03/31/2015                 Electrolytes Lab Results  Component Value Date   NA 142 10/02/2015   K 3.4 (L) 10/02/2015   CL 106 10/02/2015   CALCIUM 9.8 10/02/2015  Neuropathy Markers Lab Results  Component Value Date    HGBA1C 6.4 (H) 10/03/2015                 Bone Pathology Markers No results found for: VD25OH, KF840RF5OHK, GO7703EK3, TC4818HT0, 25OHVITD1, 25OHVITD2, 25OHVITD3, TESTOFREE, TESTOSTERONE                       Coagulation Parameters Lab Results  Component Value Date   INR 1.32 03/30/2015   LABPROT 16.6 (H) 03/30/2015   APTT 32 03/30/2015   PLT 149 (L) 10/03/2015                 Cardiovascular Markers Lab Results  Component Value Date   CKTOTAL 237 (H) 05/06/2013   CKMB 3.6 05/06/2013   TROPONINI <0.03 10/02/2015   HGB 11.7 (L) 10/03/2015   HCT 33.9 (L) 10/03/2015                 CA Markers No results found for: CEA, CA125, LABCA2               Note: Lab results reviewed.  Recent Diagnostic Imaging Results  DG Knee 1-2 Views Left CLINICAL DATA:  77 year old male with bilateral knee pain. No reported trauma. Initial encounter.  EXAM: LEFT KNEE - 1-2 VIEW  COMPARISON:  None.  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  No fracture or dislocation.  Vascular calcifications.  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  Electronically Signed   By: Genia Del M.D.   On: 07/21/2017 19:28 DG Knee 1-2 Views Right CLINICAL DATA:  77 year-old male with bilateral knee pain. No reported trauma. Initial encounter.  EXAM: RIGHT KNEE - 1-2 VIEW  COMPARISON:  None.  FINDINGS: Post total right knee replacement. There may be small loose bodies in the posterior joint space.  No fracture or dislocation.  Vascular calcifications.  IMPRESSION: Post total right knee replacement. There may be small loose bodies in the posterior joint space.  Electronically Signed   By: Genia Del M.D.   On: 07/21/2017 19:30 DG Si Joints CLINICAL DATA:  77 year old male with chronic low back pain. No reported trauma. Initial encounter.  EXAM: BILATERAL SACROILIAC JOINTS - 3+ VIEW  COMPARISON:   None.  FINDINGS: The sacroiliac joint spaces are maintained with minimal surrounding sclerosis.  Prominent degenerative changes lumbar spine.  Vascular calcifications.  IMPRESSION: Sacroiliac joints are maintain with minimal surrounding sclerosis.  Electronically Signed   By: Genia Del M.D.   On: 07/21/2017 19:26 DG Lumbar Spine Complete W/Bend CLINICAL DATA:  77 year old male with chronic lower back pain. No reported trauma. Initial encounter.  EXAM: LUMBAR SPINE - COMPLETE WITH BENDING VIEWS  COMPARISON:  05/28/2015 CT.  FINDINGS: Mild dextroscoliosis lumbar spine.  Marked disc space narrowing with surrounding osteophyte L2-3 through L5-S1.  Mild L1-2 disc space narrowing greater on the right.  No pars defect detected.  No abnormal motion between flexion and extension. Straightening in neutral position.  Vascular calcifications.  IMPRESSION: Mild dextroscoliosis lumbar spine.  Marked disc space narrowing with surrounding osteophyte L2-3 through L5-S1.  No abnormal motion between flexion and extension.  Aortic Atherosclerosis (ICD10-I70.0).  Electronically Signed   By: Genia Del M.D.   On: 07/21/2017 19:24  Complexity Note: Imaging results reviewed. Results shared with Mr. Mckelvie, using Layman's terms. Today I personally and independently reviewed the study images pertinent to Mr. Wirtz's problem.  Meds   Current Outpatient Medications:  .  amLODipine (NORVASC) 10 MG tablet, Take 1 tablet (10 mg total) by mouth daily., Disp: 30 tablet, Rfl: 0 .  aspirin 81 MG chewable tablet, Chew 1 tablet (81 mg total) by mouth daily., Disp: 30 tablet, Rfl: 0 .  atorvastatin (LIPITOR) 40 MG tablet, Take 1 tablet (40 mg total) by mouth daily at 6 PM., Disp: 30 tablet, Rfl: 0 .  Blood Glucose Monitoring Suppl (RA BLOOD GLUCOSE MONITOR) DEVI, Please provide ACCU-CHEK KIT, Disp: , Rfl:  .  clopidogrel (PLAVIX) 75 MG tablet, Take 1 tablet (75 mg  total) by mouth daily., Disp: 30 tablet, Rfl: 0 .  finasteride (PROSCAR) 5 MG tablet, Take 5 mg by mouth daily., Disp: , Rfl:  .  insulin NPH Human (HUMULIN N) 100 UNIT/ML injection, 32 units before breakfast and 24 units before supper., Disp: 10 mL, Rfl: 11 .  oxybutynin (DITROPAN-XL) 5 MG 24 hr tablet, Take 5 mg by mouth at bedtime., Disp: , Rfl:  .  tamsulosin (FLOMAX) 0.4 MG CAPS capsule, Take 0.4 mg by mouth daily., Disp: , Rfl:  .  vitamin B-12 (CYANOCOBALAMIN) 1000 MCG tablet, Take 1,000 mcg by mouth daily., Disp: , Rfl:  .  HYDROcodone-acetaminophen (NORCO) 7.5-325 MG tablet, Take 1 tablet by mouth 2 (two) times daily as needed for moderate pain. For chronic pain To last for 30 days from fill date To fill on or after: 07-30-2017, 08-29-2017, Disp: 60 tablet, Rfl: 0 .  losartan (COZAAR) 100 MG tablet, Take 100 mg by mouth daily., Disp: , Rfl:  .  pantoprazole (PROTONIX) 40 MG tablet, Take 40 mg by mouth 2 (two) times daily., Disp: , Rfl:   ROS  Constitutional: Denies any fever or chills Gastrointestinal: No reported hemesis, hematochezia, vomiting, or acute GI distress Musculoskeletal: Denies any acute onset joint swelling, redness, loss of ROM, or weakness Neurological: No reported episodes of acute onset apraxia, aphasia, dysarthria, agnosia, amnesia, paralysis, loss of coordination, or loss of consciousness  Allergies  Mr. Meyers has No Known Allergies.  [place]  Drug: Mr. Gosnell  reports that he does not use drugs. Alcohol:  reports that he does not drink alcohol. Tobacco:  reports that he has quit smoking. His smoking use included cigarettes. He has a 25.00 pack-year smoking history. he has never used smokeless tobacco. Medical:  has a past medical history of Atypical chest pain, BPH (benign prostatic hyperplasia), Chronic back pain, Diabetes mellitus without complication ([place]), Diabetic neuropathy ([place]), Essential hypertension, Falls, GERD (gastroesophageal reflux disease), Memory  disorder, OSA (obstructive sleep apnea), Osteoarthritis, Sinus bradycardia, Stroke ([place]), and Urinary incontinence. Surgical: Mr. Brinkmeyer  has a past surgical history that includes Joint replacement. Family: family history includes Alzheimer's disease in his father; Diabetes in his mother.  Constitutional Exam  General appearance: Well nourished, well developed, and well hydrated. In no apparent acute distress Vitals:   07/30/17 1111 07/30/17 1118  BP:  (!) 170/68  Pulse: (!) 47   Resp: 16   Temp: 98.3 F (36.8 C)   TempSrc: Oral   SpO2: 100%   Weight: 173 lb (78.5 kg)   Height: '5\' 5"'  (1.651 m)    BMI Assessment: Estimated body mass index is 28.79 kg/m as calculated from the following:   Height as of this encounter: '5\' 5"'  (1.651 m).   Weight as of this encounter: 173 lb (78.5 kg).  BMI interpretation table: BMI level Category Range association with higher incidence of chronic pain  <18 kg/m2  Underweight   18.5-24.9 kg/m2 Ideal body weight   25-29.9 kg/m2 Overweight Increased incidence by 20%  30-34.9 kg/m2 Obese (Class I) Increased incidence by 68%  35-39.9 kg/m2 Severe obesity (Class II) Increased incidence by 136%  >40 kg/m2 Extreme obesity (Class III) Increased incidence by 254%   BMI Readings from Last 4 Encounters:  07/30/17 28.79 kg/m  07/21/17 28.79 kg/m  04/25/17 29.38 kg/m  10/03/15 29.21 kg/m   Wt Readings from Last 4 Encounters:  07/30/17 173 lb (78.5 kg)  07/21/17 173 lb (78.5 kg)  04/25/17 182 lb (82.6 kg)  10/03/15 181 lb (82.1 kg)  Psych/Mental status: Alert, oriented x 3 (person, place, & time)       Eyes: PERLA Respiratory: No evidence of acute respiratory distress  Cervical Spine Area Exam  Skin & Axial Inspection: No masses, redness, edema, swelling, or associated skin lesions Alignment: Symmetrical Functional ROM: Unrestricted ROM      Stability: No instability detected Muscle Tone/Strength: Functionally intact. No obvious neuro-muscular  anomalies detected. Sensory (Neurological): Unimpaired Palpation: No palpable anomalies              Upper Extremity (UE) Exam    Side: Right upper extremity  Side: Left upper extremity  Skin & Extremity Inspection: Skin color, temperature, and hair growth are WNL. No peripheral edema or cyanosis. No masses, redness, swelling, asymmetry, or associated skin lesions. No contractures.  Skin & Extremity Inspection: Skin color, temperature, and hair growth are WNL. No peripheral edema or cyanosis. No masses, redness, swelling, asymmetry, or associated skin lesions. No contractures.  Functional ROM: Unrestricted ROM          Functional ROM: Unrestricted ROM          Muscle Tone/Strength: Functionally intact. No obvious neuro-muscular anomalies detected.  Muscle Tone/Strength: Functionally intact. No obvious neuro-muscular anomalies detected.  Sensory (Neurological): Unimpaired          Sensory (Neurological): Unimpaired          Palpation: No palpable anomalies              Palpation: No palpable anomalies              Specialized Test(s): Deferred         Specialized Test(s): Deferred          Thoracic Spine Area Exam  Skin & Axial Inspection: No masses, redness, or swelling Alignment: Symmetrical Functional ROM: Unrestricted ROM Stability: No instability detected Muscle Tone/Strength: Functionally intact. No obvious neuro-muscular anomalies detected. Sensory (Neurological): Unimpaired Muscle strength & Tone: No palpable anomalies  Lumbar Spine Area Exam  Skin & Axial Inspection: No masses, redness, or swelling Alignment: Symmetrical Functional ROM: Unrestricted ROM      Stability: No instability detected Muscle Tone/Strength: Functionally intact. No obvious neuro-muscular anomalies detected. Sensory (Neurological): Unimpaired Palpation: No palpable anomalies       Provocative Tests: Lumbar Hyperextension and rotation test: evaluation deferred today       Lumbar Lateral bending test:  evaluation deferred today       Patrick's Maneuver: evaluation deferred today                    Gait & Posture Assessment  Ambulation: Patient came in today in a wheel chair Gait: Very limited, using assistive device to ambulate Posture: Difficulty with positional changes   Lower Extremity Exam    Side: Right lower extremity  Side: Left lower extremity  Skin & Extremity Inspection: Skin color, temperature, and hair  growth are WNL. No peripheral edema or cyanosis. No masses, redness, swelling, asymmetry, or associated skin lesions. No contractures.  Skin & Extremity Inspection: Skin color, temperature, and hair growth are WNL. No peripheral edema or cyanosis. No masses, redness, swelling, asymmetry, or associated skin lesions. No contractures.  Functional ROM: Unrestricted ROM          Functional ROM: Unrestricted ROM          Muscle Tone/Strength: Functionally intact. No obvious neuro-muscular anomalies detected.  Muscle Tone/Strength: Functionally intact. No obvious neuro-muscular anomalies detected.  Sensory (Neurological): Unimpaired  Sensory (Neurological): Unimpaired  Palpation: No palpable anomalies  Palpation: No palpable anomalies   Assessment  Primary Diagnosis & Pertinent Problem List: The primary encounter diagnosis was Lumbar and sacral osteoarthritis. Diagnoses of Chronic pain associated with significant psychosocial dysfunction, Continuous opioid dependence ([place]), Cerebral infarction, unspecified mechanism ([place]), Lumbar degenerative disc disease, and Arthralgia of both knees were also pertinent to this visit.  Status Diagnosis  Persistent Persistent Persistent 1. Lumbar and sacral osteoarthritis   2. Chronic pain associated with significant psychosocial dysfunction   3. Continuous opioid dependence ([place])   4. Cerebral infarction, unspecified mechanism ([place])   5. Lumbar degenerative disc disease   6. Arthralgia of both knees      General Recommendations: The pain  condition that the patient suffers from is best treated with a multidisciplinary approach that involves an increase in physical activity to prevent de-conditioning and worsening of the pain cycle, as well as psychological counseling (formal and/or informal) to address the co-morbid psychological affects of pain. Treatment will often involve judicious use of pain medications and interventional procedures to decrease the pain, allowing the patient to participate in the physical activity that will ultimately produce long-lasting pain reductions. The goal of the multidisciplinary approach is to return the patient to a higher level of overall function and to restore their ability to perform activities of daily living.  77 year old male with a history of axial low back pain that radiates into his bilateral buttocks, bilateral knee pain secondary to degenerative disc disease of the lumbar spine, lumbar facet arthritis, central spinal canal stenosis, multilevel foraminal stenosis.  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  Patient is chronically anticoagulated with Plavix and therefore will want to avoid injections unless he is having significant pain flare. His previous opioid regimen included oxycodone 10 mg 3 times daily as needed, quantity 90.  I had a discussion with the patient about trying to reduce his intake and the patient is agreeable to this.  Patient has completed urine drug screen and was appropriate.  We will reduce his opioid intake to hydrocodone 7.5 mg twice daily as needed severe pain.  Patient's lumbar, SI joint and knee x-rays were reviewed with him.  Plan of Care  Pharmacotherapy (Medications Ordered): Meds ordered this encounter  Medications  . DISCONTD: HYDROcodone-acetaminophen (NORCO) 7.5-325 MG tablet    Sig: Take 1 tablet by mouth 2 (two) times daily as needed for moderate pain. For chronic pain To last for 30 days from fill date To  fill on or after: 07-30-2017, 08-29-2017    Dispense:  60 tablet    Refill:  0  . HYDROcodone-acetaminophen (NORCO) 7.5-325 MG tablet    Sig: Take 1 tablet by mouth 2 (two) times daily as needed for moderate pain. For chronic pain To last for 30 days from fill date To fill on or after: 07-30-2017, 08-29-2017  Dispense:  60 tablet    Refill:  0    Interventional management options: Previous Interventions (at [facility]) Medial branch nerve blocks, L RFA lower back, R RFA lower back, TPIs,   Provider-requested follow-up: Return in about 8 weeks (around 09/24/2017) for MM with [place].  Future Appointments  Date Time Provider [place]  09/23/2017 11:30 AM Vevelyn Francois, NP [facility] None    Primary Care Physician: Harlow Ohms, MD Location: [facility] Outpatient Pain Management Facility Note by: Gillis Santa, M.D Date: 07/30/2017; Time: 1:38 PM  Patient Instructions  1.  Sign opioid contract 2.  Prescription for hydrocodone for 2 months 3.  Follow with Crystal for medication management in 2 months.

## 2017-07-30 NOTE — Progress Notes (Signed)
Safety precautions to be maintained throughout the outpatient stay will include: orient to surroundings, keep bed in low position, maintain call bell within reach at all times, provide assistance with transfer out of bed and ambulation.  

## 2017-09-23 ENCOUNTER — Other Ambulatory Visit: Payer: Self-pay

## 2017-09-23 ENCOUNTER — Ambulatory Visit: Payer: Medicare Other | Attending: Nurse Practitioner | Admitting: Nurse Practitioner

## 2017-09-23 ENCOUNTER — Encounter: Payer: Self-pay | Admitting: Nurse Practitioner

## 2017-09-23 VITALS — BP 157/52 | HR 48 | Temp 98.1°F | Resp 18 | Ht 67.0 in | Wt 176.0 lb

## 2017-09-23 DIAGNOSIS — Z87891 Personal history of nicotine dependence: Secondary | ICD-10-CM | POA: Insufficient documentation

## 2017-09-23 DIAGNOSIS — M533 Sacrococcygeal disorders, not elsewhere classified: Secondary | ICD-10-CM

## 2017-09-23 DIAGNOSIS — R413 Other amnesia: Secondary | ICD-10-CM | POA: Insufficient documentation

## 2017-09-23 DIAGNOSIS — Z7982 Long term (current) use of aspirin: Secondary | ICD-10-CM | POA: Insufficient documentation

## 2017-09-23 DIAGNOSIS — Z820 Family history of epilepsy and other diseases of the nervous system: Secondary | ICD-10-CM | POA: Insufficient documentation

## 2017-09-23 DIAGNOSIS — E1159 Type 2 diabetes mellitus with other circulatory complications: Secondary | ICD-10-CM | POA: Insufficient documentation

## 2017-09-23 DIAGNOSIS — G894 Chronic pain syndrome: Secondary | ICD-10-CM | POA: Diagnosis not present

## 2017-09-23 DIAGNOSIS — Z9049 Acquired absence of other specified parts of digestive tract: Secondary | ICD-10-CM | POA: Insufficient documentation

## 2017-09-23 DIAGNOSIS — Z79899 Other long term (current) drug therapy: Secondary | ICD-10-CM | POA: Insufficient documentation

## 2017-09-23 DIAGNOSIS — N400 Enlarged prostate without lower urinary tract symptoms: Secondary | ICD-10-CM | POA: Insufficient documentation

## 2017-09-23 DIAGNOSIS — Z833 Family history of diabetes mellitus: Secondary | ICD-10-CM | POA: Insufficient documentation

## 2017-09-23 DIAGNOSIS — G4733 Obstructive sleep apnea (adult) (pediatric): Secondary | ICD-10-CM | POA: Insufficient documentation

## 2017-09-23 DIAGNOSIS — Z9181 History of falling: Secondary | ICD-10-CM | POA: Diagnosis not present

## 2017-09-23 DIAGNOSIS — E1136 Type 2 diabetes mellitus with diabetic cataract: Secondary | ICD-10-CM | POA: Insufficient documentation

## 2017-09-23 DIAGNOSIS — M419 Scoliosis, unspecified: Secondary | ICD-10-CM | POA: Insufficient documentation

## 2017-09-23 DIAGNOSIS — E11319 Type 2 diabetes mellitus with unspecified diabetic retinopathy without macular edema: Secondary | ICD-10-CM | POA: Insufficient documentation

## 2017-09-23 DIAGNOSIS — M5136 Other intervertebral disc degeneration, lumbar region: Secondary | ICD-10-CM | POA: Diagnosis not present

## 2017-09-23 DIAGNOSIS — E782 Mixed hyperlipidemia: Secondary | ICD-10-CM | POA: Insufficient documentation

## 2017-09-23 DIAGNOSIS — Z794 Long term (current) use of insulin: Secondary | ICD-10-CM | POA: Insufficient documentation

## 2017-09-23 DIAGNOSIS — I10 Essential (primary) hypertension: Secondary | ICD-10-CM | POA: Insufficient documentation

## 2017-09-23 DIAGNOSIS — Z8673 Personal history of transient ischemic attack (TIA), and cerebral infarction without residual deficits: Secondary | ICD-10-CM | POA: Diagnosis not present

## 2017-09-23 DIAGNOSIS — Z96653 Presence of artificial knee joint, bilateral: Secondary | ICD-10-CM | POA: Insufficient documentation

## 2017-09-23 DIAGNOSIS — E039 Hypothyroidism, unspecified: Secondary | ICD-10-CM | POA: Diagnosis not present

## 2017-09-23 DIAGNOSIS — K219 Gastro-esophageal reflux disease without esophagitis: Secondary | ICD-10-CM | POA: Insufficient documentation

## 2017-09-23 DIAGNOSIS — Z7902 Long term (current) use of antithrombotics/antiplatelets: Secondary | ICD-10-CM | POA: Insufficient documentation

## 2017-09-23 DIAGNOSIS — M549 Dorsalgia, unspecified: Secondary | ICD-10-CM | POA: Diagnosis present

## 2017-09-23 DIAGNOSIS — E1140 Type 2 diabetes mellitus with diabetic neuropathy, unspecified: Secondary | ICD-10-CM | POA: Insufficient documentation

## 2017-09-23 DIAGNOSIS — M1990 Unspecified osteoarthritis, unspecified site: Secondary | ICD-10-CM | POA: Insufficient documentation

## 2017-09-23 DIAGNOSIS — M47816 Spondylosis without myelopathy or radiculopathy, lumbar region: Secondary | ICD-10-CM | POA: Diagnosis not present

## 2017-09-23 DIAGNOSIS — H401130 Primary open-angle glaucoma, bilateral, stage unspecified: Secondary | ICD-10-CM | POA: Diagnosis not present

## 2017-09-23 MED ORDER — OXYCODONE-ACETAMINOPHEN 7.5-325 MG PO TABS: 1.0000 | ORAL_TABLET | Freq: Four times a day (QID) | ORAL | 0 refills | Status: DC | PRN

## 2017-09-23 NOTE — Progress Notes (Addendum)
Patient's Name: David Sloan  MRN: [id number]  Referring Provider: Harlow Ohms, MD  DOB: [birth year]  PCP: David Ohms, MD  DOS: 09/23/2017  Note by: David Francois NP  Service setting: Ambulatory outpatient  Specialty: Interventional Pain Management  Location: [facility] (AMB) Pain Management Facility    Patient type: Established    Primary Reason(s) for Visit: Encounter for prescription drug management. (Level of risk: moderate)  CC: Back Pain  HPI  David Sloan is a 77 y.o. year old, male patient, who comes today for a medication management evaluation. He has Lower extremity weakness; Cerebral infarction ([place]); Elevated troponin; Uncontrolled type 2 diabetes mellitus with circulatory disorder ([place]); Hyperlipidemia, mixed; EKG abnormalities; Emesis; Gall bladder disease; Slurred speech; Elevated transaminase level; Gallstones; Type 2 diabetes mellitus ([place]); Tear film insufficiency; Cerebrovascular accident (CVA) ([place]); Spondylosis of lumbar region without myelopathy or radiculopathy; SI (sacroiliac) pain; Senile corneal changes; Scoliosis; Right sciatic nerve pain; Punctate keratitis; Primary open-angle glaucoma; Other long term (current) drug therapy; Osteoarthritis; Obstructive sleep apnea; Fibrositis; Moderate stage glaucoma; Lumbar degenerative disc disease; Hypothyroid; Acid reflux; Facet arthritis of lumbar region; Essential (primary) hypertension; Esophageal stenosis; Can't get food down; 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[place]); Diabetes mellitus ([place]); Cognitive disorder; Chronic, continuous use of opioids; Chronic pain associated with significant psychosocial dysfunction; Bradycardia; Background diabetic retinopathy ([place]); Back ache; Fall; Gait instability; TIA (transient ischemic attack); Acute CVA (cerebrovascular accident) [facility]); CVA (cerebral infarction); Benign prostatic hyperplasia without lower urinary tract symptoms; Diabetic neuropathy ([place]); Herpes simplex infection of penis; History of CVA  (cerebrovascular accident); Pain medication agreement signed; Primary open angle glaucoma (POAG) of both eyes; Risk for falls; S/P cholecystectomy; Esophageal stricture; Stroke ([place]); Type II diabetes mellitus with ophthalmic manifestations ([place]); Diabetes ([place]); Controlled type 2 diabetes mellitus without complication, with long-term current use of insulin ([place]); Pain of left femur; Gastroesophageal reflux disease without esophagitis; and Chronic pain syndrome on their problem list. His primarily concern today is the Back Pain  Pain Assessment: Location: Lower Back Onset: More than a month ago Duration: Chronic pain Quality: Constant Severity: 8 /10 (self-reported pain score)  Note: Reported level is compatible with observation. Clinically the patient looks like a 3/10 A 3/10 is viewed as "Moderate" and described as significantly interfering with activities of daily living (ADL). It becomes difficult to feed, bathe, get dressed, get on and off the toilet or to perform personal hygiene functions. Difficult to get in and out of bed or a chair without assistance. Very distracting. With effort, it can be ignored when deeply involved in activities. David Sloan does not seem to understand the use of our objective pain scale When using our objective Pain Scale, levels between 6 and 10/10 are said to belong in an emergency room, as it progressively worsens from a 6/10, described as severely limiting, requiring emergency care not usually available at an outpatient pain management facility. At a 6/10 level, communication becomes difficult and requires great effort. Assistance to reach the emergency department may be required. Facial flushing and profuse sweating along with potentially dangerous increases in heart rate and blood pressure will be evident. Timing: Constant Modifying factors: Medications    David Sloan is concerned that his current medication is not effective. He is currently on oxycodone 7.5/325 mg  twice daily. He was previously on hydrocodone which was not effective. He was taken off of oxycodone/acetaminophen 10/325 3 times a day secondary to chronic constipation. He admits that he is currently taking something for constipation however he cannot remember the  name. He admits that he was given this to him by his pharmacy. When mentioned he states that MiraLAX may be the medication. He was to have his narcotic changed.  Controlled Substance Pharmacotherapy Assessment REMS (Risk Evaluation and Mitigation Strategy)  Analgesic: Oxycodone/acetaminophen 7.5/325 twice a day MME/day: 22.5 mg/day.  David Rochester, RN  09/23/2017 12:11 PM  Sign at close encounter Nursing Pain Medication Assessment:  Safety precautions to be maintained throughout the outpatient stay will include: orient to surroundings, keep bed in low position, maintain call bell within reach at all times, provide assistance with transfer out of bed and ambulation.  Medication Inspection Compliance: Pill count conducted under aseptic conditions, in front of the patient. Neither the pills nor the bottle was removed from the patient's sight at any time. Once count was completed pills were immediately returned to the patient in their original bottle.  Medication: Oxycodone IR Pill/Patch Count: 30 of 60 pills remain Pill/Patch Appearance: Markings consistent with prescribed medication Bottle Appearance: Standard pharmacy container. Clearly labeled. Filled Date: 03 / 21 / 2019 Last Medication intake:  Today   Pharmacokinetics: Liberation and absorption (onset of action): WNL Distribution (time to peak effect): WNL Metabolism and excretion (duration of action): WNL         Pharmacodynamics: Desired effects: Analgesia: David Sloan reports >50% benefit. Functional ability: Patient reports that medication allows him to accomplish basic ADLs Clinically meaningful improvement in function (CMIF): Sustained CMIF goals met Perceived  effectiveness: Described as relatively effective, allowing for increase in activities of daily living (ADL) Undesirable effects: Side-effects or Adverse reactions: None reported Monitoring: [place] PMP: Online review of the past 29-monthperiod conducted. Compliant with practice rules and regulations Last UDS on record: Summary  Date Value Ref Range Status  07/21/2017 FINAL  Final    Comment:    ==================================================================== TOXASSURE COMP DRUG ANALYSIS,UR ==================================================================== Test                             Result       Flag       Units Drug Present and Declared for Prescription Verification   Oxycodone                      1059         EXPECTED   ng/mg creat   Oxymorphone                    1116         EXPECTED   ng/mg creat   Noroxycodone                   1964         EXPECTED   ng/mg creat   Noroxymorphone                 599          EXPECTED   ng/mg creat    Sources of oxycodone are scheduled prescription medications.    Oxymorphone, noroxycodone, and noroxymorphone are expected    metabolites of oxycodone. Oxymorphone is also available as a    scheduled prescription medication.   Acetaminophen                  PRESENT      EXPECTED Drug Absent but Declared for Prescription Verification   Salicylate  Not Detected UNEXPECTED    Aspirin, as indicated in the declared medication list, is not    always detected even when used as directed.   Clonidine                      Not Detected UNEXPECTED ==================================================================== Test                      Result    Flag   Units      Ref Range   Creatinine              157              mg/dL      >=20 ==================================================================== Declared Medications:  The flagging and interpretation on this report are based on the  following declared medications.   Unexpected results may arise from  inaccuracies in the declared medications.  **Note: The testing scope of this panel includes these medications:  Clonidine (Catapres)  Oxycodone (Percocet)  **Note: The testing scope of this panel does not include small to  moderate amounts of these reported medications:  Acetaminophen (Percocet)  Aspirin (Aspirin 81)  **Note: The testing scope of this panel does not include following  reported medications:  Amlodipine (Norvasc)  Atorvastatin  Clopidogrel (Plavix)  Cyanocobalamin  Finasteride (Proscar)  Insulin  Losartan (Losartan Potassium)  Oxybutynin  Pantoprazole (Protonix)  Tamsulosin (Flomax) ==================================================================== For clinical consultation, please call [phone]. ====================================================================    UDS interpretation: Compliant          Medication Assessment Form: Reviewed. Patient indicates being compliant with therapy Treatment compliance: Compliant Risk Assessment Profile: Aberrant behavior: See prior evaluations. None observed or detected today Comorbid factors increasing risk of overdose: See prior notes. No additional risks detected today Risk of substance use disorder (SUD): Low  ORT Scoring interpretation table:  Score <3 = Low Risk for SUD  Score between 4-7 = Moderate Risk for SUD  Score >8 = High Risk for Opioid Abuse   Risk Mitigation Strategies:  Patient Counseling: Covered Patient-Prescriber Agreement (PPA): Present and active  Notification to other healthcare providers: Done  Pharmacologic Plan: No change in therapy, at this time.             Laboratory Chemistry  Inflammation Markers (CRP: Acute Phase) (ESR: Chronic Phase) No results found for: CRP, ESRSEDRATE, LATICACIDVEN                       Rheumatology Markers No results found for: RF, ANA, Therisa D[place], [facility]                      Renal  Function Markers Lab Results  Component Value Date   BUN 16 10/02/2015   CREATININE 0.95 10/03/2015   GFRAA >60 10/03/2015   GFRNONAA >60 10/03/2015                              Hepatic Function Markers Lab Results  Component Value Date   AST 31 10/02/2015   ALT 24 10/02/2015   ALBUMIN 4.0 10/02/2015   ALKPHOS 58 10/02/2015   LIPASE 23 04/23/2015   AMMONIA 19 03/31/2015                        Electrolytes Lab Results  Component Value Date   NA 142 10/02/2015  K 3.4 (L) 10/02/2015   CL 106 10/02/2015   CALCIUM 9.8 10/02/2015                        Neuropathy Markers Lab Results  Component Value Date   HGBA1C 6.4 (H) 10/03/2015                        Bone Pathology Markers No results found for: VD25OH, ZY606TK1SWF, UX3235TD3, UK0254YH0, 25OHVITD1, 25OHVITD2, 25OHVITD3, TESTOFREE, TESTOSTERONE                       Coagulation Parameters Lab Results  Component Value Date   INR 1.32 03/30/2015   LABPROT 16.6 (H) 03/30/2015   APTT 32 03/30/2015   PLT 149 (L) 10/03/2015                        Cardiovascular Markers Lab Results  Component Value Date   CKTOTAL 237 (H) 05/06/2013   CKMB 3.6 05/06/2013   TROPONINI <0.03 10/02/2015   HGB 11.7 (L) 10/03/2015   HCT 33.9 (L) 10/03/2015                         CA Markers No results found for: CEA, CA125, LABCA2                      Note: Lab results reviewed.  Recent Diagnostic Imaging Results  DG Knee 1-2 Views Left CLINICAL DATA:  77 year old male with bilateral knee pain. No reported trauma. Initial encounter.  EXAM: LEFT KNEE - 1-2 VIEW  COMPARISON:  None.  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  No fracture or dislocation.  Vascular calcifications.  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  Electronically Signed   By: Genia Del M.D.   On: 07/21/2017 19:28 DG Knee 1-2 Views Right CLINICAL DATA:  77 year-old male  with bilateral knee pain. No reported trauma. Initial encounter.  EXAM: RIGHT KNEE - 1-2 VIEW  COMPARISON:  None.  FINDINGS: Post total right knee replacement. There may be small loose bodies in the posterior joint space.  No fracture or dislocation.  Vascular calcifications.  IMPRESSION: Post total right knee replacement. There may be small loose bodies in the posterior joint space.  Electronically Signed   By: Genia Del M.D.   On: 07/21/2017 19:30 DG Si Joints CLINICAL DATA:  77 year old male with chronic low back pain. No reported trauma. Initial encounter.  EXAM: BILATERAL SACROILIAC JOINTS - 3+ VIEW  COMPARISON:  None.  FINDINGS: The sacroiliac joint spaces are maintained with minimal surrounding sclerosis.  Prominent degenerative changes lumbar spine.  Vascular calcifications.  IMPRESSION: Sacroiliac joints are maintain with minimal surrounding sclerosis.  Electronically Signed   By: Genia Del M.D.   On: 07/21/2017 19:26 DG Lumbar Spine Complete W/Bend CLINICAL DATA:  77 year old male with chronic lower back pain. No reported trauma. Initial encounter.  EXAM: LUMBAR SPINE - COMPLETE WITH BENDING VIEWS  COMPARISON:  05/28/2015 CT.  FINDINGS: Mild dextroscoliosis lumbar spine.  Marked disc space narrowing with surrounding osteophyte L2-3 through L5-S1.  Mild L1-2 disc space narrowing greater on the right.  No pars defect detected.  No abnormal motion between flexion and extension. Straightening in neutral position.  Vascular calcifications.  IMPRESSION: Mild dextroscoliosis lumbar spine.  Marked disc space narrowing with surrounding osteophyte  L2-3 through L5-S1.  No abnormal motion between flexion and extension.  Aortic Atherosclerosis (ICD10-I70.0).  Electronically Signed   By: Genia Del M.D.   On: 07/21/2017 19:24  Complexity Note: Imaging results reviewed. Results shared with David Sloan, using Layman's terms.                          Meds   Current Outpatient Medications:  .  amLODipine (NORVASC) 10 MG tablet, Take 1 tablet (10 mg total) by mouth daily., Disp: 30 tablet, Rfl: 0 .  aspirin 81 MG chewable tablet, Chew 1 tablet (81 mg total) by mouth daily., Disp: 30 tablet, Rfl: 0 .  atorvastatin (LIPITOR) 40 MG tablet, Take 1 tablet (40 mg total) by mouth daily at 6 PM., Disp: 30 tablet, Rfl: 0 .  Blood Glucose Monitoring Suppl (RA BLOOD GLUCOSE MONITOR) DEVI, Please provide ACCU-CHEK KIT, Disp: , Rfl:  .  clopidogrel (PLAVIX) 75 MG tablet, Take 1 tablet (75 mg total) by mouth daily., Disp: 30 tablet, Rfl: 0 .  finasteride (PROSCAR) 5 MG tablet, Take 5 mg by mouth daily., Disp: , Rfl:  .  insulin NPH Human (HUMULIN N) 100 UNIT/ML injection, 32 units before breakfast and 24 units before supper., Disp: 10 mL, Rfl: 11 .  oxybutynin (DITROPAN-XL) 5 MG 24 hr tablet, Take 5 mg by mouth at bedtime., Disp: , Rfl:  .  tamsulosin (FLOMAX) 0.4 MG CAPS capsule, Take 0.4 mg by mouth daily., Disp: , Rfl:  .  vitamin B-12 (CYANOCOBALAMIN) 1000 MCG tablet, Take 1,000 mcg by mouth daily., Disp: , Rfl:  .  losartan (COZAAR) 100 MG tablet, Take 100 mg by mouth daily., Disp: , Rfl:  .  [START ON 10/03/2017] oxyCODONE-acetaminophen (PERCOCET) 7.5-325 MG tablet, Take 1 tablet by mouth every 6 (six) hours as needed for moderate pain or severe pain., Disp: 90 tablet, Rfl: 0 .  pantoprazole (PROTONIX) 40 MG tablet, Take 40 mg by mouth 2 (two) times daily., Disp: , Rfl:   ROS  Constitutional: Denies any fever or chills Gastrointestinal: No reported hemesis, hematochezia, vomiting, or acute GI distress Musculoskeletal: Denies any acute onset joint swelling, redness, loss of ROM, or weakness Neurological: No reported episodes of acute onset apraxia, aphasia, dysarthria, agnosia, amnesia, paralysis, loss of coordination, or loss of consciousness  Allergies  David Sloan has No Known Allergies.  [place]  Drug: David Sloan  reports that  he does not use drugs. Alcohol:  reports that he does not drink alcohol. Tobacco:  reports that he has quit smoking. His smoking use included cigarettes. He has a 25.00 pack-year smoking history. He has never used smokeless tobacco. Medical:  has a past medical history of Atypical chest pain, BPH (benign prostatic hyperplasia), Chronic back pain, Diabetes mellitus without complication ([place]), Diabetic neuropathy ([place]), Essential hypertension, Falls, GERD (gastroesophageal reflux disease), Memory disorder, OSA (obstructive sleep apnea), Osteoarthritis, Sinus bradycardia, Stroke ([place]), and Urinary incontinence. Surgical: David Sloan  has a past surgical history that includes Joint replacement. Family: family history includes Alzheimer's disease in his father; Diabetes in his mother.  Constitutional Exam  General appearance: alert and cooperative under hydrated  Vitals:   09/23/17 1151  BP: (!) 157/52  Pulse: (!) 48  Resp: 18  Temp: 98.1 F (36.7 C)  SpO2: 100%  Weight: 176 lb (79.8 kg)  Height: '5\' 7"'  (1.702 m)  Psych/Mental status: Alert, oriented x 3 (person, place, & time)       Eyes: PERLA  Respiratory: No evidence of acute respiratory distress  Lumbar Spine Area Exam  Skin & Axial Inspection: No masses, redness, or swelling Alignment: Symmetrical Functional ROM: Unrestricted ROM      Stability: No instability detected Muscle Tone/Strength: Functionally intact. No obvious neuro-muscular anomalies detected. Sensory (Neurological): Unimpaired Palpation: Complains of area being tender to palpation       Provocative Tests: Lumbar Hyperextension and rotation test: Positive       Lumbar Lateral bending test: evaluation deferred today       Patrick's Maneuver: evaluation deferred today                    Gait & Posture Assessment  Ambulation: Patient came in today in a wheel chair Gait: Relatively normal for age and body habitus Posture: Difficulty with positional changes   Lower  Extremity Exam    Side: Right lower extremity  Side: Left lower extremity  Skin & Extremity Inspection: Skin color, temperature, and hair growth are WNL. No peripheral edema or cyanosis. No masses, redness, swelling, asymmetry, or associated skin lesions. No contractures.  Skin & Extremity Inspection: Skin color, temperature, and hair growth are WNL. No peripheral edema or cyanosis. No masses, redness, swelling, asymmetry, or associated skin lesions. No contractures.  Functional ROM: Unrestricted ROM          Functional ROM: Unrestricted ROM          Muscle Tone/Strength: Functionally intact. No obvious neuro-muscular anomalies detected.  Muscle Tone/Strength: Functionally intact. No obvious neuro-muscular anomalies detected.  Sensory (Neurological): Unimpaired  Sensory (Neurological): Unimpaired  Palpation: No palpable anomalies  Palpation: No palpable anomalies   Assessment  Primary Diagnosis & Pertinent Problem List: The primary encounter diagnosis was Spondylosis of lumbar region without myelopathy or radiculopathy. Diagnoses of Lumbar degenerative disc disease, SI (sacroiliac) pain, and Chronic pain syndrome were also pertinent to this visit.  Status Diagnosis  Persistent Controlled Controlled 1. Spondylosis of lumbar region without myelopathy or radiculopathy   2. Lumbar degenerative disc disease   3. SI (sacroiliac) pain   4. Chronic pain syndrome     Problems updated and reviewed during this visit: Problem  Chronic Pain Syndrome  Spondylosis of Lumbar Region Without Myelopathy Or Radiculopathy  Si (Sacroiliac) Pain  Hyperlipidemia, Mixed  Benign Prostatic Hyperplasia Without Lower Urinary Tract Symptoms  History of Cva (Cerebrovascular Accident)  Primary Open Angle Glaucoma ([place]) of Both Eyes  Controlled Type 2 Diabetes Mellitus Without Complication, With Long-Term Current Use of Insulin (Hcc)  Herpes Simplex Infection of Penis  Pain of Left Femur  Pain Medication Agreement  Signed   Overview:  [facility] ANES opiod agreement reviewed, signed and copy given to patient.   Fall  Risk for Falls  S/P Cholecystectomy  Diabetic Neuropathy (Hcc)   Last Assessment & Plan:  Unclear etiology for acute worsening of diabetic neuropathy over the past weekend.  Last A1C 7.3 and monofilament exam normal today.  Concern for low sugars at night with AM rebound - will decrease evening NPH to 20U.  Will increase Gabapentin and switch from BID to TID regimen.     Essential (Primary) Hypertension  Right Sciatic Nerve Pain  Esophageal Stricture   Formatting of this note might be different from the original. Etiology/CA: likely peptic Location: distal esophagus Notes:   Date Start Finish Type Steroids Notes  05/22/'15 10 12  ' TTS Balloon    07/27/'15 12 15 ' TTS Balloon    08/24/'15 15 18 ' TTS Balloon   Chronic, Continuous  Use of Opioids  Hypothyroid  Stroke (Hcc)   Last Assessment & Plan:  Noted that patient has pill bottles for Lovastatin and Lipitor and has been taking both.  Will stop Lipitor (prescribed by outside provider) and continue Lovastatin.  Also reminded patient that he should only be taking Aggrenox and not Aspirin.   Gastroesophageal Reflux Disease Without Esophagitis  Osteoarthritis  Obstructive Sleep Apnea  Lumbar Degenerative Disc Disease  Facet arthritis of lumbar region  Diabetes (Hcc)  Tear Film Insufficiency  Senile Corneal Changes  Primary Open-Angle Glaucoma  Moderate Stage Glaucoma  Background Diabetic Retinopathy (Hcc)  Type II Diabetes Mellitus With Ophthalmic Manifestations (Hcc)   Plan of Care  Pharmacotherapy (Medications Ordered): Meds ordered this encounter  Medications  . oxyCODONE-acetaminophen (PERCOCET) 7.5-325 MG tablet    Sig: Take 1 tablet by mouth every 6 (six) hours as needed for moderate pain or severe pain.    Dispense:  90 tablet    Refill:  0    Do not place this medication, or any other prescription from our practice, on  "Automatic Refill". Patient may have prescription filled one day early if pharmacy is closed on scheduled refill date. Do not fill until: 10/03/2017 To last until:11/02/2017    Order Specific Question:   Supervising Provider    Answer:   Milinda Pointer [phone]   New Prescriptions   OXYCODONE-ACETAMINOPHEN (PERCOCET) 7.5-325 MG TABLET    Take 1 tablet by mouth every 6 (six) hours as needed for moderate pain or severe pain.   Medications administered today: David Sloan had no medications administered during this visit. Lab-work, procedure(s), and/or referral(s): No orders of the defined types were placed in this encounter.  Imaging and/or referral(s): None  Interventional therapies: Planned, scheduled, and/or pending:   Not at this time.he will take good records of how fast starts working after ingested and how long it is lasting  He will also documented how many bowel movements that he is having per week before any additional adjustments in his medication will be performed. He will also drink at least 8 of the 8 ounce glasses of water per day.  Provider-requested follow-up: Return in about 1 month (around 10/21/2017) for MedMgmt with Me Donella Stade [facility]).  No future appointments. Primary Care Physician: David Ohms, MD Location: [facility] Note by: David Francois NP Date: 09/23/2017; Time: 5:39 PM  Pain Score Disclaimer: We use the NRS-11 scale. This is a self-reported, subjective measurement of pain severity with only modest accuracy. It is used primarily to identify changes within a particular patient. It must be understood that outpatient pain scales are significantly less accurate that those used for research, where they can be applied under ideal controlled circumstances with minimal exposure to variables. In reality, the score is likely to be a combination of pain intensity and pain affect, where pain affect describes the degree of emotional  arousal or changes in action readiness caused by the sensory experience of pain. Factors such as social and work situation, setting, emotional state, anxiety levels, expectation, and prior pain experience may influence pain perception and show large inter-individual differences that may also be affected by time variables.  Patient instructions provided during this appointment: Patient Instructions  ____________________________________________________________________________________________  Medication Rules  Applies to: All patients receiving prescriptions (written or electronic).  Pharmacy of record: Pharmacy where electronic prescriptions will be sent. If written prescriptions are taken to a different pharmacy, please inform the nursing staff. The pharmacy listed in  the electronic medical record should be the one where you would like electronic prescriptions to be sent.  Prescription refills: Only during scheduled appointments. Applies to both, written and electronic prescriptions.  NOTE: The following applies primarily to controlled substances (Opioid* Pain Medications).   Patient's responsibilities: 1. Pain Pills: Bring all pain pills to every appointment (except for procedure appointments). 2. Pill Bottles: Bring pills in original pharmacy bottle. Always bring newest bottle. Bring bottle, even if empty. 3. Medication refills: You are responsible for knowing and keeping track of what medications you need refilled. The day before your appointment, write a list of all prescriptions that need to be refilled. Bring that list to your appointment and give it to the admitting nurse. Prescriptions will be written only during appointments. If you forget a medication, it will not be "Called in", "Faxed", or "electronically sent". You will need to get another appointment to get these prescribed. 4. Prescription Accuracy: You are responsible for carefully inspecting your prescriptions before leaving our  office. Have the discharge nurse carefully go over each prescription with you, before taking them home. Make sure that your name is accurately spelled, that your address is correct. Check the name and dose of your medication to make sure it is accurate. Check the number of pills, and the written instructions to make sure they are clear and accurate. Make sure that you are given enough medication to last until your next medication refill appointment. 5. Taking Medication: Take medication as prescribed. Never take more pills than instructed. Never take medication more frequently than prescribed. Taking less pills or less frequently is permitted and encouraged, when it comes to controlled substances (written prescriptions).  6. Inform other Doctors: Always inform, all of your healthcare providers, of all the medications you take. 7. Pain Medication from other Providers: You are not allowed to accept any additional pain medication from any other Doctor or Healthcare provider. There are two exceptions to this rule. (see below) In the event that you require additional pain medication, you are responsible for notifying us, as stated below. 8. Medication Agreement: You are responsible for carefully reading and following our Medication Agreement. This must be signed before receiving any prescriptions from our practice. Safely store a copy of your signed Agreement. Violations to the Agreement will result in no further prescriptions. (Additional copies of our Medication Agreement are available upon request.) 9. Laws, Rules, & Regulations: All patients are expected to follow all Federal and [employer], [employer], Rules, [employer]. Ignorance of the Laws does not constitute a valid excuse. The use of any illegal substances is prohibited. 10. Adopted CDC guidelines & recommendations: Target dosing levels will be at or below 60 MME/day. Use of benzodiazepines** is not recommended.  Exceptions: There are only two  exceptions to the rule of not receiving pain medications from other Healthcare Providers. 1. Exception #1 (Emergencies): In the event of an emergency (i.e.: accident requiring emergency care), you are allowed to receive additional pain medication. However, you are responsible for: As soon as you are able, call our office [phone], at any time of the day or night, and leave a message stating your name, the date and nature of the emergency, and the name and dose of the medication prescribed. In the event that your call is answered by a member of our staff, make sure to document and save the date, time, and the name of the person that took your information.  2. Exception #2 (Planned Surgery): In the event  that you are scheduled by another doctor or dentist to have any type of surgery or procedure, you are allowed (for a period no longer than 30 days), to receive additional pain medication, for the acute post-op pain. However, in this case, you are responsible for picking up a copy of our "Post-op Pain Management for Surgeons" handout, and giving it to your surgeon or dentist. This document is available at our office, and does not require an appointment to obtain it. Simply go to our office during business hours (Monday-Thursday from 8:00 AM to 4:00 PM) (Friday 8:00 AM to 12:00 Noon) or if you have a scheduled appointment with [place], prior to your surgery, and ask for it by name. In addition, you will need to provide [place] with your name, name of your surgeon, type of surgery, and date of procedure or surgery.  *Opioid medications include: morphine, codeine, oxycodone, oxymorphone, hydrocodone, hydromorphone, meperidine, tramadol, tapentadol, buprenorphine, fentanyl, methadone. **Benzodiazepine medications include: diazepam (Valium), alprazolam (Xanax), clonazepam (Klonopine), lorazepam (Ativan), clorazepate (Tranxene), chlordiazepoxide (Librium), estazolam (Prosom), oxazepam (Serax), temazepam (Restoril),  triazolam (Halcion) (Last updated: 08/13/2017) ____________________________________________________________________________________________

## 2017-09-23 NOTE — Progress Notes (Signed)
Nursing Pain Medication Assessment:  Safety precautions to be maintained throughout the outpatient stay will include: orient to surroundings, keep bed in low position, maintain call bell within reach at all times, provide assistance with transfer out of bed and ambulation.  Medication Inspection Compliance: Pill count conducted under aseptic conditions, in front of the patient. Neither the pills nor the bottle was removed from the patient's sight at any time. Once count was completed pills were immediately returned to the patient in their original bottle.  Medication: Oxycodone IR Pill/Patch Count: 30 of 60 pills remain Pill/Patch Appearance: Markings consistent with prescribed medication Bottle Appearance: Standard pharmacy container. Clearly labeled. Filled Date: 03 / 21 / 2019 Last Medication intake:  Today

## 2017-09-23 NOTE — Patient Instructions (Signed)
____________________________________________________________________________________________  Medication Rules  Applies to: All patients receiving prescriptions (written or electronic).  Pharmacy of record: Pharmacy where electronic prescriptions will be sent. If written prescriptions are taken to a different pharmacy, please inform the nursing staff. The pharmacy listed in the electronic medical record should be the one where you would like electronic prescriptions to be sent.  Prescription refills: Only during scheduled appointments. Applies to both, written and electronic prescriptions.  NOTE: The following applies primarily to controlled substances (Opioid* Pain Medications).   Patient's responsibilities: 1. Pain Pills: Bring all pain pills to every appointment (except for procedure appointments). 2. Pill Bottles: Bring pills in original pharmacy bottle. Always bring newest bottle. Bring bottle, even if empty. 3. Medication refills: You are responsible for knowing and keeping track of what medications you need refilled. The day before your appointment, write a list of all prescriptions that need to be refilled. Bring that list to your appointment and give it to the admitting nurse. Prescriptions will be written only during appointments. If you forget a medication, it will not be "Called in", "Faxed", or "electronically sent". You will need to get another appointment to get these prescribed. 4. Prescription Accuracy: You are responsible for carefully inspecting your prescriptions before leaving our office. Have the discharge nurse carefully go over each prescription with you, before taking them home. Make sure that your name is accurately spelled, that your address is correct. Check the name and dose of your medication to make sure it is accurate. Check the number of pills, and the written instructions to make sure they are clear and accurate. Make sure that you are given enough medication to last  until your next medication refill appointment. 5. Taking Medication: Take medication as prescribed. Never take more pills than instructed. Never take medication more frequently than prescribed. Taking less pills or less frequently is permitted and encouraged, when it comes to controlled substances (written prescriptions).  6. Inform other Doctors: Always inform, all of your healthcare providers, of all the medications you take. 7. Pain Medication from other Providers: You are not allowed to accept any additional pain medication from any other Doctor or Healthcare provider. There are two exceptions to this rule. (see below) In the event that you require additional pain medication, you are responsible for notifying us, as stated below. 8. Medication Agreement: You are responsible for carefully reading and following our Medication Agreement. This must be signed before receiving any prescriptions from our practice. Safely store a copy of your signed Agreement. Violations to the Agreement will result in no further prescriptions. (Additional copies of our Medication Agreement are available upon request.) 9. Laws, Rules, & Regulations: All patients are expected to follow all Federal and State Laws, Statutes, Rules, & Regulations. Ignorance of the Laws does not constitute a valid excuse. The use of any illegal substances is prohibited. 10. Adopted CDC guidelines & recommendations: Target dosing levels will be at or below 60 MME/day. Use of benzodiazepines** is not recommended.  Exceptions: There are only two exceptions to the rule of not receiving pain medications from other Healthcare Providers. 1. Exception #1 (Emergencies): In the event of an emergency (i.e.: accident requiring emergency care), you are allowed to receive additional pain medication. However, you are responsible for: As soon as you are able, call our office [phone], at any time of the day or night, and leave a message stating your name, the  date and nature of the emergency, and the name and dose of the medication   prescribed. In the event that your call is answered by a member of our staff, make sure to document and save the date, time, and the name of the person that took your information.  2. Exception #2 (Planned Surgery): In the event that you are scheduled by another doctor or dentist to have any type of surgery or procedure, you are allowed (for a period no longer than 30 days), to receive additional pain medication, for the acute post-op pain. However, in this case, you are responsible for picking up a copy of our "Post-op Pain Management for Surgeons" handout, and giving it to your surgeon or dentist. This document is available at our office, and does not require an appointment to obtain it. Simply go to our office during business hours (Monday-Thursday from 8:00 AM to 4:00 PM) (Friday 8:00 AM to 12:00 Noon) or if you have a scheduled appointment with us, prior to your surgery, and ask for it by name. In addition, you will need to provide us with your name, name of your surgeon, type of surgery, and date of procedure or surgery.  *Opioid medications include: morphine, codeine, oxycodone, oxymorphone, hydrocodone, hydromorphone, meperidine, tramadol, tapentadol, buprenorphine, fentanyl, methadone. **Benzodiazepine medications include: diazepam (Valium), alprazolam (Xanax), clonazepam (Klonopine), lorazepam (Ativan), clorazepate (Tranxene), chlordiazepoxide (Librium), estazolam (Prosom), oxazepam (Serax), temazepam (Restoril), triazolam (Halcion) (Last updated: 08/13/2017) ____________________________________________________________________________________________    

## 2017-11-18 ENCOUNTER — Ambulatory Visit: Payer: Medicare Other | Admitting: Nurse Practitioner

## 2017-12-15 ENCOUNTER — Ambulatory Visit: Payer: Medicare Other | Attending: Nurse Practitioner | Admitting: Nurse Practitioner

## 2017-12-15 ENCOUNTER — Encounter: Payer: Self-pay | Admitting: Nurse Practitioner

## 2017-12-15 VITALS — BP 122/90 | HR 46 | Temp 98.1°F | Resp 16 | Ht 66.0 in | Wt 173.6 lb

## 2017-12-15 DIAGNOSIS — E782 Mixed hyperlipidemia: Secondary | ICD-10-CM | POA: Diagnosis not present

## 2017-12-15 DIAGNOSIS — Z8673 Personal history of transient ischemic attack (TIA), and cerebral infarction without residual deficits: Secondary | ICD-10-CM | POA: Diagnosis not present

## 2017-12-15 DIAGNOSIS — Z79891 Long term (current) use of opiate analgesic: Secondary | ICD-10-CM | POA: Diagnosis not present

## 2017-12-15 DIAGNOSIS — M47816 Spondylosis without myelopathy or radiculopathy, lumbar region: Secondary | ICD-10-CM | POA: Diagnosis not present

## 2017-12-15 DIAGNOSIS — Z87891 Personal history of nicotine dependence: Secondary | ICD-10-CM | POA: Diagnosis not present

## 2017-12-15 DIAGNOSIS — M170 Bilateral primary osteoarthritis of knee: Secondary | ICD-10-CM | POA: Insufficient documentation

## 2017-12-15 DIAGNOSIS — Z794 Long term (current) use of insulin: Secondary | ICD-10-CM | POA: Diagnosis not present

## 2017-12-15 DIAGNOSIS — Z5181 Encounter for therapeutic drug level monitoring: Secondary | ICD-10-CM | POA: Insufficient documentation

## 2017-12-15 DIAGNOSIS — M5136 Other intervertebral disc degeneration, lumbar region: Secondary | ICD-10-CM | POA: Diagnosis not present

## 2017-12-15 DIAGNOSIS — M533 Sacrococcygeal disorders, not elsewhere classified: Secondary | ICD-10-CM | POA: Diagnosis not present

## 2017-12-15 DIAGNOSIS — G894 Chronic pain syndrome: Secondary | ICD-10-CM | POA: Diagnosis not present

## 2017-12-15 DIAGNOSIS — M549 Dorsalgia, unspecified: Secondary | ICD-10-CM | POA: Insufficient documentation

## 2017-12-15 DIAGNOSIS — E1159 Type 2 diabetes mellitus with other circulatory complications: Secondary | ICD-10-CM | POA: Insufficient documentation

## 2017-12-15 DIAGNOSIS — Z79899 Other long term (current) drug therapy: Secondary | ICD-10-CM | POA: Insufficient documentation

## 2017-12-15 DIAGNOSIS — G4733 Obstructive sleep apnea (adult) (pediatric): Secondary | ICD-10-CM | POA: Diagnosis not present

## 2017-12-15 DIAGNOSIS — K219 Gastro-esophageal reflux disease without esophagitis: Secondary | ICD-10-CM | POA: Diagnosis not present

## 2017-12-15 DIAGNOSIS — Z7982 Long term (current) use of aspirin: Secondary | ICD-10-CM | POA: Insufficient documentation

## 2017-12-15 MED ORDER — OXYCODONE-ACETAMINOPHEN 7.5-325 MG PO TABS: 1.0000 | ORAL_TABLET | Freq: Four times a day (QID) | ORAL | 0 refills | Status: DC | PRN

## 2017-12-15 NOTE — Progress Notes (Deleted)
Safety precautions to be maintained throughout the outpatient stay will include: orient to surroundings, keep bed in low position, maintain call bell within reach at all times, provide assistance with transfer out of bed and ambulation.  

## 2017-12-15 NOTE — Progress Notes (Signed)
Nursing Pain Medication Assessment:  Safety precautions to be maintained throughout the outpatient stay will include: orient to surroundings, keep bed in low position, maintain call bell within reach at all times, provide assistance with transfer out of bed and ambulation.  Medication Inspection Compliance: Pill count conducted under aseptic conditions, in front of the patient. Neither the pills nor the bottle was removed from the patient's sight at any time. Once count was completed pills were immediately returned to the patient in their original bottle.  Medication: Oxycodone/APAP Pill/Patch Count: 0 of 90 pills remain Pill/Patch Appearance: Markings consistent with prescribed medication Bottle Appearance: Standard pharmacy container. Clearly labeled. Filled Date: 04 / 20 / 2019 Last Medication intake:  Today

## 2017-12-15 NOTE — Progress Notes (Signed)
Patient's Name: David Sloan  MRN: [id number]  Referring Provider: Harlow Ohms, MD  DOB: Mar 07, 1941  PCP: Harlow Ohms, MD  DOS: 12/15/2017  Note by: Vevelyn Francois NP  Service setting: Ambulatory outpatient  Specialty: Interventional Pain Management  Location: [facility] (AMB) Pain Management Facility    Patient type: Established    Primary Reason(s) for Visit: Encounter for prescription drug management. (Level of risk: moderate)  CC: Back Pain (mid bilateral)  HPI  Mr. David Sloan is a 77 y.o. year old, male patient, who comes today for a medication management evaluation. He has Lower extremity weakness; Cerebral infarction ([place]); Elevated troponin; Uncontrolled type 2 diabetes mellitus with circulatory disorder ([place]); Hyperlipidemia, mixed; EKG abnormalities; Emesis; Gall bladder disease; Slurred speech; Elevated transaminase level; Gallstones; Type 2 diabetes mellitus ([place]); Tear film insufficiency; Cerebrovascular accident (CVA) ([place]); Spondylosis of lumbar region without myelopathy or radiculopathy; SI (sacroiliac) pain; Senile corneal changes; Scoliosis; Right sciatic nerve pain; Punctate keratitis; Primary open-angle glaucoma; Other long term (current) drug therapy; Osteoarthritis; Obstructive sleep apnea; Fibrositis; Moderate stage glaucoma; Lumbar degenerative disc disease; Hypothyroid; Acid reflux; Facet arthritis of lumbar region; Essential (primary) hypertension; Esophageal stenosis; Can't get food down; 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[place]); Diabetes mellitus ([place]); Cognitive disorder; Chronic, continuous use of opioids; Chronic pain associated with significant psychosocial dysfunction; Bradycardia; Background diabetic retinopathy ([place]); Back ache; Fall; Gait instability; TIA (transient ischemic attack); Acute CVA (cerebrovascular accident) [facility]); CVA (cerebral infarction); Benign prostatic hyperplasia without lower urinary tract symptoms; Diabetic neuropathy ([place]); Herpes simplex infection of penis; History of  CVA (cerebrovascular accident); Pain medication agreement signed; Primary open angle glaucoma (POAG) of both eyes; Risk for falls; S/P cholecystectomy; Esophageal stricture; Stroke ([place]); 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 His primarily concern today is the Back Pain (mid bilateral)  Pain Assessment: Location: Mid, Left, Right Back Radiating: denies Onset: More than a month ago Duration: Chronic pain Quality: Discomfort, Sharp, Constant Severity: 10-Worst pain ever/10 (subjective, self-reported pain score)  Note: Reported level is compatible with observation. Clinically the patient looks like a 2/10 A 2/10 is viewed as "Mild to Moderate" and described as noticeable and distracting. Impossible to hide from other people. More frequent flare-ups. Still possible to adapt and function close to normal. It can be very annoying and may have occasional stronger flare-ups. With discipline, patients may get used to it and adapt. Information on the proper use of the pain scale provided to the patient today. When using our objective Pain Scale, levels between 6 and 10/10 are said to belong in an emergency room, as it progressively worsens from a 6/10, described as severely limiting, requiring emergency care not usually available at an outpatient pain management facility. At a 6/10 level, communication becomes difficult and requires great effort. Assistance to reach the emergency department may be required. Facial flushing and profuse sweating along with potentially dangerous increases in heart rate and blood pressure will be evident. Effect on ADL: patient presents in wheelchair.  states the pain is bad.  Timing: Constant Modifying factors: medications  help BP: 122/90  HR: (!) 46  Mr. David Sloan was last scheduled for an appointment on 09/23/2017 for medication management. During today's appointment we reviewed Mr. David Sloan's chronic pain status, as well as his outpatient medication regimen. He feels like his right leg pain.  The patient  reports that he does not use drugs. His body mass index is 28.02 kg/m.  Further details on both, my assessment(s), as well as the proposed treatment plan, please see below.  Controlled Substance Pharmacotherapy Assessment REMS (Risk Evaluation and Mitigation Strategy)  Analgesic: Oxycodone/acetaminophen 7.5/325 twice a day MME/day: 22.5 mg/day.  Janett Billow, RN  12/15/2017 10:34 AM  Sign at close encounter Nursing Pain Medication Assessment:  Safety precautions to be maintained throughout the outpatient stay will include: orient to surroundings, keep bed in low position, maintain call bell within reach at all times, provide assistance with transfer out of bed and ambulation.  Medication Inspection Compliance: Pill count conducted under aseptic conditions, in front of the patient. Neither the pills nor the bottle was removed from the patient's sight at any time. Once count was completed pills were immediately returned to the patient in their original bottle.  Medication: Oxycodone/APAP Pill/Patch Count: 0 of 90 pills remain Pill/Patch Appearance: Markings consistent with prescribed medication Bottle Appearance: Standard pharmacy container. Clearly labeled. Filled Date: 04 / 20 / 2019 Last Medication intake:  Today   Pharmacokinetics: Liberation and absorption (onset of action): WNL Distribution (time to peak effect): WNL Metabolism and excretion (duration of action): WNL         Pharmacodynamics: Desired effects: Analgesia: Mr. David Sloan reports >50% benefit. Functional ability: Patient reports that medication allows him to accomplish basic ADLs Clinically meaningful improvement in function (CMIF):  Sustained CMIF goals met Perceived effectiveness: Described as relatively effective, allowing for increase in activities of daily living (ADL) Undesirable effects: Side-effects or Adverse reactions: None reported Monitoring: [place] PMP: Online review of the past 63-monthperiod conducted. Compliant with practice rules and regulations Last UDS on record: Summary  Date Value Ref Range Status  07/21/2017 FINAL  Final    Comment:    ==================================================================== TOXASSURE COMP DRUG ANALYSIS,UR ==================================================================== Test                             Result       Flag       Units Drug Present and Declared for Prescription Verification   Oxycodone                      1059         EXPECTED   ng/mg creat   Oxymorphone                    1116         EXPECTED   ng/mg creat   Noroxycodone                   1964         EXPECTED   ng/mg creat   Noroxymorphone                 599          EXPECTED   ng/mg creat    Sources of oxycodone are scheduled prescription medications.    Oxymorphone, noroxycodone, and noroxymorphone are expected    metabolites of oxycodone. Oxymorphone is also available as a    scheduled prescription medication.   Acetaminophen                  PRESENT      EXPECTED Drug Absent but Declared for Prescription Verification   Salicylate  Not Detected UNEXPECTED    Aspirin, as indicated in the declared medication list, is not    always detected even when used as directed.   Clonidine                      Not Detected UNEXPECTED ==================================================================== Test                      Result    Flag   Units      Ref Range   Creatinine              157              mg/dL      >=20 ==================================================================== Declared Medications:  The flagging and interpretation on this report are based on the   following declared medications.  Unexpected results may arise from  inaccuracies in the declared medications.  **Note: The testing scope of this panel includes these medications:  Clonidine (Catapres)  Oxycodone (Percocet)  **Note: The testing scope of this panel does not include small to  moderate amounts of these reported medications:  Acetaminophen (Percocet)  Aspirin (Aspirin 81)  **Note: The testing scope of this panel does not include following  reported medications:  Amlodipine (Norvasc)  Atorvastatin  Clopidogrel (Plavix)  Cyanocobalamin  Finasteride (Proscar)  Insulin  Losartan (Losartan Potassium)  Oxybutynin  Pantoprazole (Protonix)  Tamsulosin (Flomax) ==================================================================== For clinical consultation, please call [phone]. ====================================================================    UDS interpretation: Compliant          Medication Assessment Form: Reviewed. Patient indicates being compliant with therapy Treatment compliance: Compliant Risk Assessment Profile: Aberrant behavior: See prior evaluations. None observed or detected today Comorbid factors increasing risk of overdose: See prior notes. No additional risks detected today Risk of substance use disorder (SUD): Low Opioid Risk Tool - 12/15/17 1036      Family History of Substance Abuse   Alcohol  Negative    Illegal Drugs  Negative    Rx Drugs  Negative      Personal History of Substance Abuse   Alcohol  Negative    Illegal Drugs  Negative    Rx Drugs  Negative      Psychological Disease   Psychological Disease  Negative    Depression  Negative      Total Score   Opioid Risk Tool Scoring  0    Opioid Risk Interpretation  Low Risk      ORT Scoring interpretation table:  Score <3 = Low Risk for SUD  Score between 4-7 = Moderate Risk for SUD  Score >8 = High Risk for Opioid Abuse   Risk Mitigation Strategies:  Patient Counseling:  Covered Patient-Prescriber Agreement (PPA): Present and active  Notification to other healthcare providers: Done  Pharmacologic Plan: No change in therapy, at this time.             Laboratory Chemistry  Inflammation Markers (CRP: Acute Phase) (ESR: Chronic Phase) No results found for: CRP, ESRSEDRATE, LATICACIDVEN                       Rheumatology Markers No results found for: RF, ANA, LABURIC, URICUR, LYMEIGGIGMAB, LYMEABIGMQN, HLAB27                      Renal Function Markers Lab Results  Component Value Date   BUN 16 10/02/2015   CREATININE 0.95 10/03/2015   GFRAA >  60 10/03/2015   GFRNONAA >60 10/03/2015                             Hepatic Function Markers Lab Results  Component Value Date   AST 31 10/02/2015   ALT 24 10/02/2015   ALBUMIN 4.0 10/02/2015   ALKPHOS 58 10/02/2015   LIPASE 23 04/23/2015   AMMONIA 19 03/31/2015                        Electrolytes Lab Results  Component Value Date   NA 142 10/02/2015   K 3.4 (L) 10/02/2015   CL 106 10/02/2015   CALCIUM 9.8 10/02/2015                        Neuropathy Markers Lab Results  Component Value Date   HGBA1C 6.4 (H) 10/03/2015                        Bone Pathology Markers No results found for: VD25OH, [id number], BJ4782NF6, OZ3086VH8, 25OHVITD1, 25OHVITD2, 25OHVITD3, TESTOFREE, TESTOSTERONE                       Coagulation Parameters Lab Results  Component Value Date   INR 1.32 03/30/2015   LABPROT 16.6 (H) 03/30/2015   APTT 32 03/30/2015   PLT 149 (L) 10/03/2015                        Cardiovascular Markers Lab Results  Component Value Date   CKTOTAL 237 (H) 05/06/2013   CKMB 3.6 05/06/2013   TROPONINI <0.03 10/02/2015   HGB 11.7 (L) 10/03/2015   HCT 33.9 (L) 10/03/2015                         CA Markers No results found for: CEA, CA125, LABCA2                      Note: Lab results reviewed.  Recent Diagnostic Imaging Results  DG Knee 1-2 Views Left CLINICAL DATA:  77 year old  male with bilateral knee pain. No reported trauma. Initial encounter.  EXAM: LEFT KNEE - 1-2 VIEW  COMPARISON:  None.  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  No fracture or dislocation.  Vascular calcifications.  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  Electronically Signed   By: Genia Del M.D.   On: 07/21/2017 19:28 DG Knee 1-2 Views Right CLINICAL DATA:  77 year-old male with bilateral knee pain. No reported trauma. Initial encounter.  EXAM: RIGHT KNEE - 1-2 VIEW  COMPARISON:  None.  FINDINGS: Post total right knee replacement. There may be small loose bodies in the posterior joint space.  No fracture or dislocation.  Vascular calcifications.  IMPRESSION: Post total right knee replacement. There may be small loose bodies in the posterior joint space.  Electronically Signed   By: Genia Del M.D.   On: 07/21/2017 19:30 DG Si Joints CLINICAL DATA:  77 year old male with chronic low back pain. No reported trauma. Initial encounter.  EXAM: BILATERAL SACROILIAC JOINTS - 3+ VIEW  COMPARISON:  None.  FINDINGS: The sacroiliac joint spaces are maintained with minimal surrounding sclerosis.  Prominent degenerative changes lumbar spine.  Vascular calcifications.  IMPRESSION: Sacroiliac joints are maintain with minimal surrounding  sclerosis.  Electronically Signed   By: Genia Del M.D.   On: 07/21/2017 19:26 DG Lumbar Spine Complete W/Bend CLINICAL DATA:  77 year old male with chronic lower back pain. No reported trauma. Initial encounter.  EXAM: LUMBAR SPINE - COMPLETE WITH BENDING VIEWS  COMPARISON:  05/28/2015 CT.  FINDINGS: Mild dextroscoliosis lumbar spine.  Marked disc space narrowing with surrounding osteophyte L2-3 through L5-S1.  Mild L1-2 disc space narrowing greater on the right.  No pars defect detected.  No abnormal motion between flexion and  extension. Straightening in neutral position.  Vascular calcifications.  IMPRESSION: Mild dextroscoliosis lumbar spine.  Marked disc space narrowing with surrounding osteophyte L2-3 through L5-S1.  No abnormal motion between flexion and extension.  Aortic Atherosclerosis (ICD10-I70.0).  Electronically Signed   By: Genia Del M.D.   On: 07/21/2017 19:24  Complexity Note: Imaging results reviewed. Results shared with Mr. Hilyard, using Layman's terms.                         Meds   Current Outpatient Medications:  .  amLODipine (NORVASC) 10 MG tablet, Take 1 tablet (10 mg total) by mouth daily., Disp: 30 tablet, Rfl: 0 .  aspirin 81 MG chewable tablet, Chew 1 tablet (81 mg total) by mouth daily., Disp: 30 tablet, Rfl: 0 .  atorvastatin (LIPITOR) 40 MG tablet, Take 1 tablet (40 mg total) by mouth daily at 6 PM., Disp: 30 tablet, Rfl: 0 .  Blood Glucose Monitoring Suppl (RA BLOOD GLUCOSE MONITOR) DEVI, Please provide ACCU-CHEK KIT, Disp: , Rfl:  .  clopidogrel (PLAVIX) 75 MG tablet, Take 1 tablet (75 mg total) by mouth daily., Disp: 30 tablet, Rfl: 0 .  finasteride (PROSCAR) 5 MG tablet, Take 5 mg by mouth daily., Disp: , Rfl:  .  insulin NPH Human (HUMULIN N) 100 UNIT/ML injection, 32 units before breakfast and 24 units before supper., Disp: 10 mL, Rfl: 11 .  losartan (COZAAR) 100 MG tablet, Take 100 mg by mouth daily., Disp: , Rfl:  .  oxybutynin (DITROPAN-XL) 5 MG 24 hr tablet, Take 5 mg by mouth at bedtime., Disp: , Rfl:  .  tamsulosin (FLOMAX) 0.4 MG CAPS capsule, Take 0.4 mg by mouth daily., Disp: , Rfl:  .  vitamin B-12 (CYANOCOBALAMIN) 1000 MCG tablet, Take 1,000 mcg by mouth daily., Disp: , Rfl:  .  losartan (COZAAR) 100 MG tablet, Take 100 mg by mouth daily., Disp: , Rfl:  .  oxyCODONE-acetaminophen (PERCOCET) 7.5-325 MG tablet, Take 1 tablet by mouth every 6 (six) hours as needed for moderate pain or severe pain., Disp: 90 tablet, Rfl: 0 .  [START ON 01/14/2018]  oxyCODONE-acetaminophen (PERCOCET) 7.5-325 MG tablet, Take 1 tablet by mouth every 6 (six) hours as needed for moderate pain or severe pain., Disp: 90 tablet, Rfl: 0 .  pantoprazole (PROTONIX) 40 MG tablet, Take 40 mg by mouth 2 (two) times daily., Disp: , Rfl:   ROS  Constitutional: Denies any fever or chills Gastrointestinal: No reported hemesis, hematochezia, vomiting, or acute GI distress Musculoskeletal: Denies any acute onset joint swelling, redness, loss of ROM, or weakness Neurological: No reported episodes of acute onset apraxia, aphasia, dysarthria, agnosia, amnesia, paralysis, loss of coordination, or loss of consciousness  Allergies  Mr. Bossman has No Known Allergies.  [place]  Drug: Mr. Stoiber  reports that he does not use drugs. Alcohol:  reports that he does not drink alcohol. Tobacco:  reports that he has quit smoking. His smoking  use included cigarettes. He has a 25.00 pack-year smoking history. He has never used smokeless tobacco. Medical:  has a past medical history of Atypical chest pain, BPH (benign prostatic hyperplasia), Chronic back pain, Diabetes mellitus without complication ([place]), Diabetic neuropathy ([place]), Essential hypertension, Falls, GERD (gastroesophageal reflux disease), Memory disorder, OSA (obstructive sleep apnea), Osteoarthritis, Sinus bradycardia, Stroke ([place]), and Urinary incontinence. Surgical: Mr. Papillion  has a past surgical history that includes Joint replacement. Family: family history includes Alzheimer's disease in his father; Diabetes in his mother.  Constitutional Exam  General appearance: Well nourished, well developed, and well hydrated. In no apparent acute distress Vitals:   12/15/17 1028  BP: 122/90  Pulse: (!) 46  Resp: 16  Temp: 98.1 F (36.7 C)  TempSrc: Oral  SpO2: 100%  Weight: 173 lb 9.6 oz (78.7 kg)  Height: '5\' 6"'  (1.676 m)   BMI Assessment: Estimated body mass index is 28.02 kg/m as calculated from the following:   Height as of  this encounter: '5\' 6"'  (1.676 m).   Weight as of this encounter: 173 lb 9.6 oz (78.7 kg).  Psych/Mental status: Alert, oriented x 3 (person, place, & time)       Eyes: PERLA Respiratory: No evidence of acute respiratory distress   Lumbar Spine Area Exam  Skin & Axial Inspection: No masses, redness, or swelling Alignment: Symmetrical Functional ROM: Unrestricted ROM       Stability: No instability detected Muscle Tone/Strength: Functionally intact. No obvious neuro-muscular anomalies detected. Sensory (Neurological): Unimpaired Palpation: Complains of area being tender to palpation       Provocative Tests: Lumbar Hyperextension/rotation test: deferred today       Lumbar quadrant test (Kemp's test): deferred today       Lumbar Lateral bending test: deferred today       Patrick's Maneuver: deferred today                   FABER test: deferred today       Thigh-thrust test: deferred today       S-I compression test: deferred today       S-I distraction test: deferred today        Gait & Posture Assessment  Ambulation: Patient ambulates using a wheel chair Gait: Relatively normal for age and body habitus Posture: WNL   Lower Extremity Exam    Side: Right lower extremity  Side: Left lower extremity  Stability: No instability observed          Stability: No instability observed          Skin & Extremity Inspection: Pitting edema  Skin & Extremity Inspection: Pitting edema  Functional ROM: Unrestricted ROM                  Functional ROM: Unrestricted ROM                  Muscle Tone/Strength: Functionally intact. No obvious neuro-muscular anomalies detected.  Muscle Tone/Strength: Functionally intact. No obvious neuro-muscular anomalies detected.  Sensory (Neurological): Unimpaired  Sensory (Neurological): Unimpaired  Palpation: No palpable anomalies  Palpation: No palpable anomalies   Assessment  Primary Diagnosis & Pertinent Problem List: The primary encounter diagnosis was  Spondylosis of lumbar region without myelopathy or radiculopathy. Diagnoses of SI (sacroiliac) pain, Lumbar degenerative disc disease, Chronic pain syndrome, and Long term prescription opiate use were also pertinent to this visit.  Status Diagnosis  Stable Stable Stable 1. Spondylosis of lumbar region without myelopathy or radiculopathy  2. SI (sacroiliac) pain   3. Lumbar degenerative disc disease   4. Chronic pain syndrome   5. Long term prescription opiate use     Problems updated and reviewed during this visit: Problem  Chronic Pain Syndrome   Plan of Care  Pharmacotherapy (Medications Ordered): Meds ordered this encounter  Medications  . oxyCODONE-acetaminophen (PERCOCET) 7.5-325 MG tablet    Sig: Take 1 tablet by mouth every 6 (six) hours as needed for moderate pain or severe pain.    Dispense:  90 tablet    Refill:  0    Do not place this medication, or any other prescription from our practice, on "Automatic Refill". Patient may have prescription filled one day early if pharmacy is closed on scheduled refill date. Do not fill until: 12/15/2017 To last until:01/14/2018    Order Specific Question:   Supervising Provider    Answer:   Milinda Pointer [phone]  . oxyCODONE-acetaminophen (PERCOCET) 7.5-325 MG tablet    Sig: Take 1 tablet by mouth every 6 (six) hours as needed for moderate pain or severe pain.    Dispense:  90 tablet    Refill:  0    Do not place this medication, or any other prescription from our practice, on "Automatic Refill". Patient may have prescription filled one day early if pharmacy is closed on scheduled refill date. Do not fill until: 01/14/2018 To last until: 02/13/2018    Order Specific Question:   Supervising Provider    Answer:   Milinda Pointer [phone]   New Prescriptions   OXYCODONE-ACETAMINOPHEN (PERCOCET) 7.5-325 MG TABLET    Take 1 tablet by mouth every 6 (six) hours as needed for moderate pain or severe pain.   Medications administered  today: Torell J. Gros had no medications administered during this visit. Lab-work, procedure(s), and/or referral(s): Orders Placed This Encounter  Procedures  . ToxASSURE Select 13 (MW), Urine   Imaging and/or referral(s): None  Interventional therapies: Planned, scheduled, and/or pending:   Not at this time.    Provider-requested follow-up: Return in about 2 months (around 02/15/2018) for MedMgmt with Me Donella Stade Edison Pace).  Future Appointments  Date Time Provider [place]  03/04/2018 11:15 AM Vevelyn Francois, NP [facility] None   Primary Care Physician: Harlow Ohms, MD Location: [facility] Outpatient Pain Management Facility Note by: Vevelyn Francois NP Date: 12/15/2017; Time: 1:00 PM  Pain Score Disclaimer: We use the NRS-11 scale. This is a self-reported, subjective measurement of pain severity with only modest accuracy. It is used primarily to identify changes within a particular patient. It must be understood that outpatient pain scales are significantly less accurate that those used for research, where they can be applied under ideal controlled circumstances with minimal exposure to variables. In reality, the score is likely to be a combination of pain intensity and pain affect, where pain affect describes the degree of emotional arousal or changes in action readiness caused by the sensory experience of pain. Factors such as social and work situation, setting, emotional state, anxiety levels, expectation, and prior pain experience may influence pain perception and show large inter-individual differences that may also be affected by time variables.  Patient instructions provided during this appointment: Patient Instructions   ____________________________________________________________________________________________  Medication Rules  Applies to: All patients receiving prescriptions (written or electronic).  Pharmacy of record: Pharmacy where electronic prescriptions will be  sent. If written prescriptions are taken to a different pharmacy, please inform the nursing staff. The pharmacy listed in the electronic medical  record should be the one where you would like electronic prescriptions to be sent.  Prescription refills: Only during scheduled appointments. Applies to both, written and electronic prescriptions.  NOTE: The following applies primarily to controlled substances (Opioid* Pain Medications).   Patient's responsibilities: 1. Pain Pills: Bring all pain pills to every appointment (except for procedure appointments). 2. Pill Bottles: Bring pills in original pharmacy bottle. Always bring newest bottle. Bring bottle, even if empty. 3. Medication refills: You are responsible for knowing and keeping track of what medications you need refilled. The day before your appointment, write a list of all prescriptions that need to be refilled. Bring that list to your appointment and give it to the admitting nurse. Prescriptions will be written only during appointments. If you forget a medication, it will not be "Called in", "Faxed", or "electronically sent". You will need to get another appointment to get these prescribed. 4. Prescription Accuracy: You are responsible for carefully inspecting your prescriptions before leaving our office. Have the discharge nurse carefully go over each prescription with you, before taking them home. Make sure that your name is accurately spelled, that your address is correct. Check the name and dose of your medication to make sure it is accurate. Check the number of pills, and the written instructions to make sure they are clear and accurate. Make sure that you are given enough medication to last until your next medication refill appointment. 5. Taking Medication: Take medication as prescribed. Never take more pills than instructed. Never take medication more frequently than prescribed. Taking less pills or less frequently is permitted and encouraged,  when it comes to controlled substances (written prescriptions).  6. Inform other Doctors: Always inform, all of your healthcare providers, of all the medications you take. 7. Pain Medication from other Providers: You are not allowed to accept any additional pain medication from any other Doctor or Healthcare provider. There are two exceptions to this rule. (see below) In the event that you require additional pain medication, you are responsible for notifying us, as stated below. 8. Medication Agreement: You are responsible for carefully reading and following our Medication Agreement. This must be signed before receiving any prescriptions from our practice. Safely store a copy of your signed Agreement. Violations to the Agreement will result in no further prescriptions. (Additional copies of our Medication Agreement are available upon request.) 9. Laws, Rules, & Regulations: All patients are expected to follow all Federal and [employer], [employer], Rules, [employer]. Ignorance of the Laws does not constitute a valid excuse. The use of any illegal substances is prohibited. 10. Adopted CDC guidelines & recommendations: Target dosing levels will be at or below 60 MME/day. Use of benzodiazepines** is not recommended.  Exceptions: There are only two exceptions to the rule of not receiving pain medications from other Healthcare Providers. 1. Exception #1 (Emergencies): In the event of an emergency (i.e.: accident requiring emergency care), you are allowed to receive additional pain medication. However, you are responsible for: As soon as you are able, call our office [phone], at any time of the day or night, and leave a message stating your name, the date and nature of the emergency, and the name and dose of the medication prescribed. In the event that your call is answered by a member of our staff, make sure to document and save the date, time, and the name of the person that took your information.   2. Exception #2 (Planned Surgery): In the event that you are  scheduled by another doctor or dentist to have any type of surgery or procedure, you are allowed (for a period no longer than 30 days), to receive additional pain medication, for the acute post-op pain. However, in this case, you are responsible for picking up a copy of our "Post-op Pain Management for Surgeons" handout, and giving it to your surgeon or dentist. This document is available at our office, and does not require an appointment to obtain it. Simply go to our office during business hours (Monday-Thursday from 8:00 AM to 4:00 PM) (Friday 8:00 AM to 12:00 Noon) or if you have a scheduled appointment with [place], prior to your surgery, and ask for it by name. In addition, you will need to provide [place] with your name, name of your surgeon, type of surgery, and date of procedure or surgery.  *Opioid medications include: morphine, codeine, oxycodone, oxymorphone, hydrocodone, hydromorphone, meperidine, tramadol, tapentadol, buprenorphine, fentanyl, methadone. **Benzodiazepine medications include: diazepam (Valium), alprazolam (Xanax), clonazepam (Klonopine), lorazepam (Ativan), clorazepate (Tranxene), chlordiazepoxide (Librium), estazolam (Prosom), oxazepam (Serax), temazepam (Restoril), triazolam (Halcion) (Last updated: 08/13/2017) ____________________________________________________________________________________________   ____________________________________________________________________________________________  Pain Scale  Introduction: The pain score used by this practice is the Verbal Numerical Rating Scale (VNRS-11). This is an 11-point scale. It is for adults and children 10 years or older. There are significant differences in how the pain score is reported, used, and applied. Forget everything you learned in the past and learn this scoring system.  General Information: The scale should reflect your current level of pain. Unless  you are specifically asked for the level of your worst pain, or your average pain. If you are asked for one of these two, then it should be understood that it is over the past 24 hours.  Basic Activities of Daily Living (ADL): Personal hygiene, dressing, eating, transferring, and using restroom.  Instructions: Most patients tend to report their level of pain as a combination of two factors, their physical pain and their psychosocial pain. This last one is also known as "suffering" and it is reflection of how physical pain affects you socially and psychologically. From now on, report them separately. From this point on, when asked to report your pain level, report only your physical pain. Use the following table for reference.  Pain Clinic Pain Levels (0-5/10)  Pain Level Score  Description  No Pain 0   Mild pain 1 Nagging, annoying, but does not interfere with basic activities of daily living (ADL). Patients are able to eat, bathe, get dressed, toileting (being able to get on and off the toilet and perform personal hygiene functions), transfer (move in and out of bed or a chair without assistance), and maintain continence (able to control bladder and bowel functions). Blood pressure and heart rate are unaffected. A normal heart rate for a healthy adult ranges from 60 to 100 bpm (beats per minute).   Mild to moderate pain 2 Noticeable and distracting. Impossible to hide from other people. More frequent flare-ups. Still possible to adapt and function close to normal. It can be very annoying and may have occasional stronger flare-ups. With discipline, patients may get used to it and adapt.   Moderate pain 3 Interferes significantly with activities of daily living (ADL). It becomes difficult to feed, bathe, get dressed, get on and off the toilet or to perform personal hygiene functions. Difficult to get in and out of bed or a chair without assistance. Very distracting. With effort, it can be ignored when  deeply involved in activities.  Moderately severe pain 4 Impossible to ignore for more than a few minutes. With effort, patients may still be able to manage work or participate in some social activities. Very difficult to concentrate. Signs of autonomic nervous system discharge are evident: dilated pupils (mydriasis); mild sweating (diaphoresis); sleep interference. Heart rate becomes elevated (>115 bpm). Diastolic blood pressure (lower number) rises above 100 mmHg. Patients find relief in laying down and not moving.   Severe pain 5 Intense and extremely unpleasant. Associated with frowning face and frequent crying. Pain overwhelms the senses.  Ability to do any activity or maintain social relationships becomes significantly limited. Conversation becomes difficult. Pacing back and forth is common, as getting into a comfortable position is nearly impossible. Pain wakes you up from deep sleep. Physical signs will be obvious: pupillary dilation; increased sweating; goosebumps; brisk reflexes; cold, clammy hands and feet; nausea, vomiting or dry heaves; loss of appetite; significant sleep disturbance with inability to fall asleep or to remain asleep. When persistent, significant weight loss is observed due to the complete loss of appetite and sleep deprivation.  Blood pressure and heart rate becomes significantly elevated. Caution: If elevated blood pressure triggers a pounding headache associated with blurred vision, then the patient should immediately seek attention at an urgent or emergency care unit, as these may be signs of an impending stroke.    Emergency Department Pain Levels (6-10/10)  Emergency Room Pain 6 Severely limiting. Requires emergency care and should not be seen or managed at an outpatient pain management facility. Communication becomes difficult and requires great effort. Assistance to reach the emergency department may be required. Facial flushing and profuse sweating along with  potentially dangerous increases in heart rate and blood pressure will be evident.   Distressing pain 7 Self-care is very difficult. Assistance is required to transport, or use restroom. Assistance to reach the emergency department will be required. Tasks requiring coordination, such as bathing and getting dressed become very difficult.   Disabling pain 8 Self-care is no longer possible. At this level, pain is disabling. The individual is unable to do even the most "basic" activities such as walking, eating, bathing, dressing, transferring to a bed, or toileting. Fine motor skills are lost. It is difficult to think clearly.   Incapacitating pain 9 Pain becomes incapacitating. Thought processing is no longer possible. Difficult to remember your own name. Control of movement and coordination are lost.   The worst pain imaginable 10 At this level, most patients pass out from pain. When this level is reached, collapse of the autonomic nervous system occurs, leading to a sudden drop in blood pressure and heart rate. This in turn results in a temporary and dramatic drop in blood flow to the brain, leading to a loss of consciousness. Fainting is one of the body's self defense mechanisms. Passing out puts the brain in a calmed state and causes it to shut down for a while, in order to begin the healing process.    Summary: 1. Refer to this scale when providing [place] with your pain level. 2. Be accurate and careful when reporting your pain level. This will help with your care. 3. Over-reporting your pain level will lead to loss of credibility. 4. Even a level of 1/10 means that there is pain and will be treated at our facility. 5. High, inaccurate reporting will be documented as "Symptom Exaggeration", leading to loss of credibility and suspicions of possible secondary gains such as obtaining more narcotics, or wanting to appear disabled, for  fraudulent reasons. 6. Only pain levels of 5 or below will be seen at  our facility. 7. Pain levels of 6 and above will be sent to the Emergency Department and the appointment cancelled. ____________________________________________________________________________________________

## 2017-12-15 NOTE — Patient Instructions (Addendum)
____________________________________________________________________________________________  Medication Rules  Applies to: All patients receiving prescriptions (written or electronic).  Pharmacy of record: Pharmacy where electronic prescriptions will be sent. If written prescriptions are taken to a different pharmacy, please inform the nursing staff. The pharmacy listed in the electronic medical record should be the one where you would like electronic prescriptions to be sent.  Prescription refills: Only during scheduled appointments. Applies to both, written and electronic prescriptions.  NOTE: The following applies primarily to controlled substances (Opioid* Pain Medications).   Patient's responsibilities: 1. Pain Pills: Bring all pain pills to every appointment (except for procedure appointments). 2. Pill Bottles: Bring pills in original pharmacy bottle. Always bring newest bottle. Bring bottle, even if empty. 3. Medication refills: You are responsible for knowing and keeping track of what medications you need refilled. The day before your appointment, write a list of all prescriptions that need to be refilled. Bring that list to your appointment and give it to the admitting nurse. Prescriptions will be written only during appointments. If you forget a medication, it will not be "Called in", "Faxed", or "electronically sent". You will need to get another appointment to get these prescribed. 4. Prescription Accuracy: You are responsible for carefully inspecting your prescriptions before leaving our office. Have the discharge nurse carefully go over each prescription with you, before taking them home. Make sure that your name is accurately spelled, that your address is correct. Check the name and dose of your medication to make sure it is accurate. Check the number of pills, and the written instructions to make sure they are clear and accurate. Make sure that you are given enough medication to last  until your next medication refill appointment. 5. Taking Medication: Take medication as prescribed. Never take more pills than instructed. Never take medication more frequently than prescribed. Taking less pills or less frequently is permitted and encouraged, when it comes to controlled substances (written prescriptions).  6. Inform other Doctors: Always inform, all of your healthcare providers, of all the medications you take. 7. Pain Medication from other Providers: You are not allowed to accept any additional pain medication from any other Doctor or Healthcare provider. There are two exceptions to this rule. (see below) In the event that you require additional pain medication, you are responsible for notifying us, as stated below. 8. Medication Agreement: You are responsible for carefully reading and following our Medication Agreement. This must be signed before receiving any prescriptions from our practice. Safely store a copy of your signed Agreement. Violations to the Agreement will result in no further prescriptions. (Additional copies of our Medication Agreement are available upon request.) 9. Laws, Rules, & Regulations: All patients are expected to follow all Federal and State Laws, Statutes, Rules, & Regulations. Ignorance of the Laws does not constitute a valid excuse. The use of any illegal substances is prohibited. 10. Adopted CDC guidelines & recommendations: Target dosing levels will be at or below 60 MME/day. Use of benzodiazepines** is not recommended.  Exceptions: There are only two exceptions to the rule of not receiving pain medications from other Healthcare Providers. 1. Exception #1 (Emergencies): In the event of an emergency (i.e.: accident requiring emergency care), you are allowed to receive additional pain medication. However, you are responsible for: As soon as you are able, call our office [phone], at any time of the day or night, and leave a message stating your name, the  date and nature of the emergency, and the name and dose of the medication   prescribed. In the event that your call is answered by a member of our staff, make sure to document and save the date, time, and the name of the person that took your information.  2. Exception #2 (Planned Surgery): In the event that you are scheduled by another doctor or dentist to have any type of surgery or procedure, you are allowed (for a period no longer than 30 days), to receive additional pain medication, for the acute post-op pain. However, in this case, you are responsible for picking up a copy of our "Post-op Pain Management for Surgeons" handout, and giving it to your surgeon or dentist. This document is available at our office, and does not require an appointment to obtain it. Simply go to our office during business hours (Monday-Thursday from 8:00 AM to 4:00 PM) (Friday 8:00 AM to 12:00 Noon) or if you have a scheduled appointment with us, prior to your surgery, and ask for it by name. In addition, you will need to provide us with your name, name of your surgeon, type of surgery, and date of procedure or surgery.  *Opioid medications include: morphine, codeine, oxycodone, oxymorphone, hydrocodone, hydromorphone, meperidine, tramadol, tapentadol, buprenorphine, fentanyl, methadone. **Benzodiazepine medications include: diazepam (Valium), alprazolam (Xanax), clonazepam (Klonopine), lorazepam (Ativan), clorazepate (Tranxene), chlordiazepoxide (Librium), estazolam (Prosom), oxazepam (Serax), temazepam (Restoril), triazolam (Halcion) (Last updated: 08/13/2017) ____________________________________________________________________________________________   ____________________________________________________________________________________________  Pain Scale  Introduction: The pain score used by this practice is the Verbal Numerical Rating Scale (VNRS-11). This is an 11-point scale. It is for adults and children 10 years or  older. There are significant differences in how the pain score is reported, used, and applied. Forget everything you learned in the past and learn this scoring system.  General Information: The scale should reflect your current level of pain. Unless you are specifically asked for the level of your worst pain, or your average pain. If you are asked for one of these two, then it should be understood that it is over the past 24 hours.  Basic Activities of Daily Living (ADL): Personal hygiene, dressing, eating, transferring, and using restroom.  Instructions: Most patients tend to report their level of pain as a combination of two factors, their physical pain and their psychosocial pain. This last one is also known as "suffering" and it is reflection of how physical pain affects you socially and psychologically. From now on, report them separately. From this point on, when asked to report your pain level, report only your physical pain. Use the following table for reference.  Pain Clinic Pain Levels (0-5/10)  Pain Level Score  Description  No Pain 0   Mild pain 1 Nagging, annoying, but does not interfere with basic activities of daily living (ADL). Patients are able to eat, bathe, get dressed, toileting (being able to get on and off the toilet and perform personal hygiene functions), transfer (move in and out of bed or a chair without assistance), and maintain continence (able to control bladder and bowel functions). Blood pressure and heart rate are unaffected. A normal heart rate for a healthy adult ranges from 60 to 100 bpm (beats per minute).   Mild to moderate pain 2 Noticeable and distracting. Impossible to hide from other people. More frequent flare-ups. Still possible to adapt and function close to normal. It can be very annoying and may have occasional stronger flare-ups. With discipline, patients may get used to it and adapt.   Moderate pain 3 Interferes significantly with activities of daily  living (ADL).   It becomes difficult to feed, bathe, get dressed, get on and off the toilet or to perform personal hygiene functions. Difficult to get in and out of bed or a chair without assistance. Very distracting. With effort, it can be ignored when deeply involved in activities.   Moderately severe pain 4 Impossible to ignore for more than a few minutes. With effort, patients may still be able to manage work or participate in some social activities. Very difficult to concentrate. Signs of autonomic nervous system discharge are evident: dilated pupils (mydriasis); mild sweating (diaphoresis); sleep interference. Heart rate becomes elevated (>115 bpm). Diastolic blood pressure (lower number) rises above 100 mmHg. Patients find relief in laying down and not moving.   Severe pain 5 Intense and extremely unpleasant. Associated with frowning face and frequent crying. Pain overwhelms the senses.  Ability to do any activity or maintain social relationships becomes significantly limited. Conversation becomes difficult. Pacing back and forth is common, as getting into a comfortable position is nearly impossible. Pain wakes you up from deep sleep. Physical signs will be obvious: pupillary dilation; increased sweating; goosebumps; brisk reflexes; cold, clammy hands and feet; nausea, vomiting or dry heaves; loss of appetite; significant sleep disturbance with inability to fall asleep or to remain asleep. When persistent, significant weight loss is observed due to the complete loss of appetite and sleep deprivation.  Blood pressure and heart rate becomes significantly elevated. Caution: If elevated blood pressure triggers a pounding headache associated with blurred vision, then the patient should immediately seek attention at an urgent or emergency care unit, as these may be signs of an impending stroke.    Emergency Department Pain Levels (6-10/10)  Emergency Room Pain 6 Severely limiting. Requires emergency care  and should not be seen or managed at an outpatient pain management facility. Communication becomes difficult and requires great effort. Assistance to reach the emergency department may be required. Facial flushing and profuse sweating along with potentially dangerous increases in heart rate and blood pressure will be evident.   Distressing pain 7 Self-care is very difficult. Assistance is required to transport, or use restroom. Assistance to reach the emergency department will be required. Tasks requiring coordination, such as bathing and getting dressed become very difficult.   Disabling pain 8 Self-care is no longer possible. At this level, pain is disabling. The individual is unable to do even the most "basic" activities such as walking, eating, bathing, dressing, transferring to a bed, or toileting. Fine motor skills are lost. It is difficult to think clearly.   Incapacitating pain 9 Pain becomes incapacitating. Thought processing is no longer possible. Difficult to remember your own name. Control of movement and coordination are lost.   The worst pain imaginable 10 At this level, most patients pass out from pain. When this level is reached, collapse of the autonomic nervous system occurs, leading to a sudden drop in blood pressure and heart rate. This in turn results in a temporary and dramatic drop in blood flow to the brain, leading to a loss of consciousness. Fainting is one of the body's self defense mechanisms. Passing out puts the brain in a calmed state and causes it to shut down for a while, in order to begin the healing process.    Summary: 1. Refer to this scale when providing us with your pain level. 2. Be accurate and careful when reporting your pain level. This will help with your care. 3. Over-reporting your pain level will lead to loss of credibility. 4. Even   a level of 1/10 means that there is pain and will be treated at our facility. 5. High, inaccurate reporting will be  documented as "Symptom Exaggeration", leading to loss of credibility and suspicions of possible secondary gains such as obtaining more narcotics, or wanting to appear disabled, for fraudulent reasons. 6. Only pain levels of 5 or below will be seen at our facility. 7. Pain levels of 6 and above will be sent to the Emergency Department and the appointment cancelled. ____________________________________________________________________________________________    

## 2017-12-20 LAB — TOXASSURE SELECT 13 (MW), URINE

## 2018-02-08 ENCOUNTER — Emergency Department: Payer: Medicare Other

## 2018-02-08 ENCOUNTER — Emergency Department
Admission: EM | Admit: 2018-02-08 | Discharge: 2018-02-09 | Disposition: A | Discharge status: Other Acute Inpatient Hospital | Payer: Medicare Other | Attending: Emergency Medicine | Admitting: Emergency Medicine

## 2018-02-08 DIAGNOSIS — Y999 Unspecified external cause status: Secondary | ICD-10-CM | POA: Diagnosis not present

## 2018-02-08 DIAGNOSIS — W010XXA Fall on same level from slipping, tripping and stumbling without subsequent striking against object, initial encounter: Secondary | ICD-10-CM | POA: Insufficient documentation

## 2018-02-08 DIAGNOSIS — Z7902 Long term (current) use of antithrombotics/antiplatelets: Secondary | ICD-10-CM | POA: Insufficient documentation

## 2018-02-08 DIAGNOSIS — Y929 Unspecified place or not applicable: Secondary | ICD-10-CM | POA: Insufficient documentation

## 2018-02-08 DIAGNOSIS — E119 Type 2 diabetes mellitus without complications: Secondary | ICD-10-CM | POA: Insufficient documentation

## 2018-02-08 DIAGNOSIS — I610 Nontraumatic intracerebral hemorrhage in hemisphere, subcortical: Secondary | ICD-10-CM | POA: Insufficient documentation

## 2018-02-08 DIAGNOSIS — Z7982 Long term (current) use of aspirin: Secondary | ICD-10-CM | POA: Insufficient documentation

## 2018-02-08 DIAGNOSIS — E039 Hypothyroidism, unspecified: Secondary | ICD-10-CM | POA: Insufficient documentation

## 2018-02-08 DIAGNOSIS — Z794 Long term (current) use of insulin: Secondary | ICD-10-CM | POA: Diagnosis not present

## 2018-02-08 DIAGNOSIS — R531 Weakness: Secondary | ICD-10-CM | POA: Diagnosis present

## 2018-02-08 DIAGNOSIS — I10 Essential (primary) hypertension: Secondary | ICD-10-CM | POA: Insufficient documentation

## 2018-02-08 DIAGNOSIS — Z79899 Other long term (current) drug therapy: Secondary | ICD-10-CM | POA: Diagnosis not present

## 2018-02-08 DIAGNOSIS — Y9301 Activity, walking, marching and hiking: Secondary | ICD-10-CM | POA: Diagnosis not present

## 2018-02-08 DIAGNOSIS — Z87891 Personal history of nicotine dependence: Secondary | ICD-10-CM | POA: Insufficient documentation

## 2018-02-08 LAB — CBC WITH DIFFERENTIAL/PLATELET
BASOS ABS: 0 10*3/uL (ref 0–0.1)
BASOS PCT: 1 %
EOS ABS: 0.1 10*3/uL (ref 0–0.7)
EOS PCT: 2 %
HCT: 32.8 % — ABNORMAL LOW (ref 40.0–52.0)
Hemoglobin: 11.4 g/dL — ABNORMAL LOW (ref 13.0–18.0)
Lymphocytes Relative: 27 %
Lymphs Abs: 1.6 10*3/uL (ref 1.0–3.6)
MCH: 31 pg (ref 26.0–34.0)
MCHC: 34.7 g/dL (ref 32.0–36.0)
MCV: 89.2 fL (ref 80.0–100.0)
MONO ABS: 0.7 10*3/uL (ref 0.2–1.0)
Monocytes Relative: 12 %
Neutro Abs: 3.2 10*3/uL (ref 1.4–6.5)
Neutrophils Relative %: 58 %
PLATELETS: 173 10*3/uL (ref 150–440)
RBC: 3.68 MIL/uL — ABNORMAL LOW (ref 4.40–5.90)
RDW: 13.3 % (ref 11.5–14.5)
WBC: 5.7 10*3/uL (ref 3.8–10.6)

## 2018-02-08 LAB — COMPREHENSIVE METABOLIC PANEL
ALBUMIN: 3.5 g/dL (ref 3.5–5.0)
ALT: 24 U/L (ref 0–44)
ANION GAP: 3 — AB (ref 5–15)
AST: 26 U/L (ref 15–41)
Alkaline Phosphatase: 62 U/L (ref 38–126)
BUN: 18 mg/dL (ref 8–23)
CO2: 28 mmol/L (ref 22–32)
Calcium: 8.9 mg/dL (ref 8.9–10.3)
Chloride: 108 mmol/L (ref 98–111)
Creatinine, Ser: 0.88 mg/dL (ref 0.61–1.24)
GFR calc Af Amer: >60 mL/min (ref >60)
GFR calc non Af Amer: >60 mL/min (ref >60)
GLUCOSE: 119 mg/dL — AB (ref 70–99)
POTASSIUM: 4 mmol/L (ref 3.5–5.1)
SODIUM: 139 mmol/L (ref 135–145)
Total Bilirubin: 0.3 mg/dL (ref 0.3–1.2)
Total Protein: 6.1 g/dL — ABNORMAL LOW (ref 6.5–8.1)

## 2018-02-08 LAB — GLUCOSE, CAPILLARY: Glucose-Capillary: 131 mg/dL — ABNORMAL HIGH (ref 70–99)

## 2018-02-08 LAB — TROPONIN I: Troponin I: 0.03 ng/mL (ref <0.03)

## 2018-02-08 NOTE — ED Notes (Signed)
Date and time results received: 02/08/18 2253 (use smartphrase ".now" to insert current time)  Test: Troponin Critical Value: 0.03  Name of Provider Notified: Kem Boroughsari Triplett NP  Orders Received? Or Actions Taken?:

## 2018-02-08 NOTE — ED Triage Notes (Addendum)
Pt arrived via EMS from home with complaints of a fall. Family found him in floor leaning on his right side. Family suspects he had another stroke. Pt is now alert and oriented x 4. Pt states he does not remember falling and is unaware if he his his head, but he thinks he did. Pt has a Hx of a stroke. EMS stated that he is negative on the stroke screen.VS per EMS BP-146/61 HR-46 (his hear rate is normally in this range per family) BS-115. Pt has a medication and allergy list at the bedside. PT denies pain at this time. NP at bedside. Pt LKW 6:45pm today and pt was found on floor at 8:15pm today.

## 2018-02-08 NOTE — ED Provider Notes (Addendum)
----------------------------------------- 10:59 PM on 02/08/2018 -----------------------------------------    [facility] Emergency Department Provider Note  ____________________________________________   I have reviewed the triage vital signs and the nursing notes. Where available I have reviewed prior notes and, if possible and indicated, outside hospital notes.    HISTORY  Chief Complaint No chief complaint on file.    HPI David Sloan is a 76 y.o. male In conjunction with the nurse petitioner, patient is a 77 year old man on Plavix and aspirin with a known history of bradycardia, prior strokes, he has here today complaining of having fallen.  He thinks he tripped.  Is not exactly clear on that.  He has no pain.  However family noted after he tripped or fell and was found down, he had a right sided upper and lower extremity weakness which seem to be more than normal.  He does not normally have right-sided weakness in the upper extremity, family brought him in.  He was last seen well at 645.  He states he has no headache, he states he feels "pretty good".  He denies any fever or chills.  Denies any vomiting.  Family feels that he is leaning to the right more and that his right-sided weakness is more pronounced than baseline.  Have a history of chronic bradycardia.    Past Medical History:  Diagnosis Date  . Atypical chest pain    a. 04/2013 MV: nl EF, no ischemia.  Marland Kitchen BPH (benign prostatic hyperplasia)   . Chronic back pain   . Diabetes mellitus without complication ([place])   . Diabetic neuropathy ([place])   . Essential hypertension   . Falls   . GERD (gastroesophageal reflux disease)   . Memory disorder   . OSA (obstructive sleep apnea)   . Osteoarthritis    a. s/p bilat knee surgeries.  . Sinus bradycardia   . Stroke [facility])    a. ~ 2002 - no residual deficits;  b. 03/2015 Head CT: prior old infarcts and foci of small vesel dzs in pons bilat. ? bilat  occipital lobe acute infarcts R>L.  Marland Kitchen Urinary incontinence     Patient Active Problem List   Diagnosis Date Noted  . Chronic pain syndrome 09/23/2017  . Hyperlipidemia, mixed 06/24/2017  . Benign prostatic hyperplasia without lower urinary tract symptoms 06/24/2017  . History of CVA (cerebrovascular accident) 06/24/2017  . Primary open angle glaucoma (POAG) of both eyes 06/24/2017  . Controlled type 2 diabetes mellitus without complication, with long-term current use of insulin ([place]) 06/24/2017  . 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 Pain of left femur 09/29/2016  . Pain medication agreement signed 08/05/2016  . Fall 10/03/2015  . Gait instability 10/03/2015  . TIA (transient ischemic attack) 10/03/2015  . Acute CVA (cerebrovascular accident) ([place]) 10/03/2015  . CVA (cerebral infarction) 10/03/2015  . Risk for falls 07/30/2015  . S/P cholecystectomy 07/30/2015  . Diabetic neuropathy ([place]) 07/17/2015  . Scoliosis 04/13/2015  . Fibrositis 04/13/2015  . Slurred speech 03/31/2015  . Elevated transaminase level 03/31/2015  . Gallstones 03/31/2015  . Lower extremity weakness 03/30/2015  . Cerebral infarction ([place])   . Elevated troponin   . Uncontrolled type 2 diabetes mellitus with circulatory disorder ([place])   . EKG abnormalities   . Emesis   . Gall bladder disease   . Other long term (current) drug therapy 02/13/2015  . Essential (primary) hypertension 09/18/2014  . Right sciatic nerve pain 09/15/2014  . Bradycardia 09/13/2014  . Cognitive disorder  04/05/2014  . Chronic pain associated with significant psychosocial dysfunction 01/17/2014  . Back ache 01/17/2014  . Esophageal stenosis 11/30/2013  . Can't get food down 11/30/2013  . Esophageal stricture 11/30/2013  . Chronic, continuous use of opioids 07/06/2013  . Cerebrovascular accident (CVA) ([place]) 05/19/2013  . Hypothyroid 05/19/2013  . Acid reflux 05/19/2013  . Stroke ([place]) 05/19/2013  . Gastroesophageal  reflux disease without esophagitis 05/19/2013  . Spondylosis of lumbar region without myelopathy or radiculopathy 01/11/2013  . SI (sacroiliac) pain 10/06/2012  . Osteoarthritis 10/06/2012  . Obstructive sleep apnea 10/06/2012  . Lumbar degenerative disc disease 10/06/2012  . Facet arthritis of lumbar region 10/06/2012  . Diabetes mellitus ([place]) 10/06/2012  . Diabetes ([place]) 10/06/2012  . Punctate keratitis 01/21/2012  . 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[place]) 01/21/2012  . Type 2 diabetes mellitus ([place]) 10/16/2010  . Tear film insufficiency 10/16/2010  . Senile corneal changes 10/16/2010  . Primary open-angle glaucoma 10/16/2010  . Moderate stage glaucoma 10/16/2010  . Background diabetic retinopathy ([place]) 10/16/2010  . Type II diabetes mellitus with ophthalmic manifestations ([place]) 10/16/2010    Past Surgical History:  Procedure Laterality Date  . JOINT REPLACEMENT     bilateral knee replacements    Prior to Admission medications   Medication Sig Start Date End Date Taking? Authorizing Provider  amLODipine (NORVASC) 10 MG tablet Take 1 tablet (10 mg total) by mouth daily. 10/04/15   Loletha Grayer, MD  aspirin 81 MG chewable tablet Chew 1 tablet (81 mg total) by mouth daily. 10/04/15   Loletha Grayer, MD  atorvastatin (LIPITOR) 40 MG tablet Take 1 tablet (40 mg total) by mouth daily at 6 PM. 10/04/15   Loletha Grayer, MD  Blood Glucose Monitoring Suppl (RA BLOOD GLUCOSE MONITOR) DEVI Please provide ACCU-CHEK KIT 04/17/15   [provider]  clopidogrel (PLAVIX) 75 MG tablet Take 1 tablet (75 mg total) by mouth daily. 10/04/15   Loletha Grayer, MD  finasteride (PROSCAR) 5 MG tablet Take 5 mg by mouth daily.    [provider]  insulin NPH Human (HUMULIN N) 100 UNIT/ML injection 32 units before breakfast and 24 units before supper. 10/04/15   Loletha Grayer, MD  losartan (COZAAR) 100 MG tablet Take 100 mg by mouth daily. 12/20/14 07/21/17  [provider]  losartan  (COZAAR) 100 MG tablet Take 100 mg by mouth daily. 10/23/17   [provider]  oxybutynin (DITROPAN-XL) 5 MG 24 hr tablet Take 5 mg by mouth at bedtime.    [provider]  oxyCODONE-acetaminophen (PERCOCET) 7.5-325 MG tablet Take 1 tablet by mouth every 6 (six) hours as needed for moderate pain or severe pain. 01/14/18 02/13/18  Vevelyn Francois, NP  pantoprazole (PROTONIX) 40 MG tablet Take 40 mg by mouth 2 (two) times daily. 01/15/15 07/21/17  [provider]  tamsulosin (FLOMAX) 0.4 MG CAPS capsule Take 0.4 mg by mouth daily.    [provider]  vitamin B-12 (CYANOCOBALAMIN) 1000 MCG tablet Take 1,000 mcg by mouth daily.    [provider]    Allergies Patient has no known allergies.  Family History  Problem Relation Age of Onset  . Diabetes Mother   . Alzheimer's disease Father     Social History Social History   Tobacco Use  . Smoking status: Former Smoker    Packs/day: 1.00    Years: 25.00    Pack years: 25.00    Types: Cigarettes  . Smokeless tobacco: Never Used  Substance Use Topics  . Alcohol  use: No  . Drug use: No    Review of Systems Constitutional: No fever/chills Eyes: No visual changes. ENT: No sore throat. No stiff neck no neck pain Cardiovascular: Denies chest pain. Respiratory: Denies shortness of breath. Gastrointestinal:   no vomiting.  No diarrhea.  No constipation. Genitourinary: Negative for dysuria. Musculoskeletal: Negative lower extremity swelling Skin: Negative for rash. Neurological: Negative for severe headaches, right-sided weakness patient states he feels "pretty good".  ____________________________________________   PHYSICAL EXAM:  VITAL SIGNS: ED Triage Vitals  Enc Vitals Group     BP 02/08/18 2224 (!) 145/60     Pulse Rate 02/08/18 2145 (!) 54     Resp 02/08/18 2145 19     Temp 02/08/18 2230 98.1 F (36.7 C)     Temp Source 02/08/18 2145 Oral     SpO2 02/08/18 2145 100 %     Weight  02/08/18 2146 176 lb (79.8 kg)     Height 02/08/18 2146 _0  (1.676 m)     Head Circumference --      Peak Flow --      Pain Score 02/08/18 2146 0     Pain Loc --      Pain Edu? --      Excl. in [place]? --     Constitutional: Alert and oriented. Well appearing and in no acute distress. Eyes: Conjunctivae are normal Head: Atraumatic HEENT: No congestion/rhinnorhea. Mucous membranes are moist.  Oropharynx non-erythematous Neck:   Nontender with no meningismus, no masses, no stridor Cardiovascular: Normal rate, regular rhythm. Grossly normal heart sounds.  Good peripheral circulation. Respiratory: Normal respiratory effort.  No retractions. Lungs CTAB. Abdominal: Soft and nontender. No distention. No guarding no rebound Back:  There is no focal tenderness or step off.  there is no midline tenderness there are no lesions noted. there is no CVA tenderness Musculoskeletal: No lower extremity tenderness, no upper extremity tenderness. No joint effusions, no DVT signs strong distal pulses no edema Neurologic:  Normal speech and language.  Has some right upper extremity drift and right lower extremity drift, sensation appears to be intact.  Very mild perhaps right facial droop, limited exam family feels his face looks normal.  Speech is reassuring.  He is oriented. Skin:  Skin is warm, dry and intact. No rash noted. Psychiatric: Mood and affect are normal. Speech and behavior are normal.  ____________________________________________   LABS (all labs ordered are listed, but only abnormal results are displayed)  Labs Reviewed  CBC WITH DIFFERENTIAL/PLATELET - Abnormal; Notable for the following components:      Result Value   RBC 3.68 (*)    Hemoglobin 11.4 (*)    HCT 32.8 (*)    All other components within normal limits  TROPONIN I - Abnormal; Notable for the following components:   Troponin I 0.03 (*)    All other components within normal limits  COMPREHENSIVE METABOLIC PANEL - Abnormal;  Notable for the following components:   Glucose, Bld 119 (*)    Total Protein 6.1 (*)    Anion gap 3 (*)    All other components within normal limits  GLUCOSE, CAPILLARY - Abnormal; Notable for the following components:   Glucose-Capillary 131 (*)    All other components within normal limits  URINALYSIS, COMPLETE (UACMP) WITH MICROSCOPIC    Pertinent labs  results that were available during my care of the patient were reviewed by me and considered in my medical decision making (see chart for details). ____________________________________________  EKG  I personally interpreted any EKGs ordered by me or triage Bradycardia, no ischemic changes noted ____________________________________________  RADIOLOGY  Pertinent labs & imaging results that were available during my care of the patient were reviewed by me and considered in my medical decision making (see chart for details). If possible, patient and/or family made aware of any abnormal findings.  Dg Chest 2 View  Result Date: 02/08/2018 CLINICAL DATA:  Fall.  Suspect stroke. EXAM: CHEST - 2 VIEW COMPARISON:  Chest radiograph May 28, 2015 FINDINGS: Cardiomediastinal silhouette is normal. No pleural effusions or focal consolidations. Trachea projects midline and there is no pneumothorax. Soft tissue planes and included osseous structures are non-suspicious. Mild LEFT acromioclavicular osteoarthrosis. Mild-to-moderate spondylosis. IMPRESSION: No acute cardiopulmonary process. Electronically Signed   By: Elon Alas M.D.   On: 02/08/2018 22:02   Ct Head Wo Contrast  Result Date: 02/08/2018 CLINICAL DATA:  Fall. EXAM: CT HEAD WITHOUT CONTRAST TECHNIQUE: Contiguous axial images were obtained from the base of the skull through the vertex without intravenous contrast. COMPARISON:  10/02/2015.  MRI 10/03/2015. FINDINGS: Brain: Area of acute hemorrhage noted within the left thalamus measuring up to 1.4 cm. Old infarct in the left frontal  and left occipital lobe. There is atrophy and chronic small vessel disease changes. No hydrocephalus or midline shift. Vascular: No hyperdense vessel or unexpected calcification. Skull: No acute calvarial abnormality. Sinuses/Orbits: Visualized paranasal sinuses and mastoids clear. Orbital soft tissues unremarkable. Other: None IMPRESSION: 1.4 cm acute hemorrhage within the left thalamus. Old left frontal and occipital infarcts. Atrophy, chronic small vessel disease. Critical Value/emergent results were called by telephone at the time of interpretation on 02/08/2018 at 10:26 pm to [facility] , who verbally acknowledged these results. Electronically Signed   By: Rolm Baptise M.D.   On: 02/08/2018 22:30   ____________________________________________    PROCEDURES  Procedure(s) performed: None  Procedures  Critical Care performed: CRITICAL CARE Performed by: Schuyler Amor   Total critical care time: 38 minutes  Critical care time was exclusive of separately billable procedures and treating other patients.  Critical care was necessary to treat or prevent imminent or life-threatening deterioration.  Critical care was time spent personally by me on the following activities: development of treatment plan with patient and/or surrogate as well as nursing, discussions with consultants, evaluation of patient's response to treatment, examination of patient, obtaining history from patient or surrogate, ordering and performing treatments and interventions, ordering and review of laboratory studies, ordering and review of radiographic studies, pulse oximetry and re-evaluation of patient's condition.   ____________________________________________   INITIAL IMPRESSION / ASSESSMENT AND PLAN / ED COURSE  Pertinent labs & imaging results that were available during my care of the patient were reviewed by me and considered in my medical decision making (see chart for details).  Patient here with a  questionable fall and right-sided weakness.  CT scan does shows a thalamic infarct.  He is awake and alert and guarding his airway, I do not think he requires intubation but he does require further care, she has been reassuring.  We are transferring to [facility] for definitive care.  [facility] has graciously accepted him.  Appreciate the consult.  Do not see any evidence of hip fracture or CVA tenderness or neck tenderness, injury injury noted he is nexus negative I do not think he requires a scan of his neck nor do I think he requires a c-collar.  He is nontender. I talked to [facility] about him as well as  radiology.  I personally reviewed his CT scan base my decision making and plan when I saw the projection with radiology   ____________________________________________   FINAL CLINICAL IMPRESSION(S) / ED DIAGNOSES  Final diagnoses:  None      This chart was dictated using voice recognition software.  Despite best efforts to proofread,  errors can occur which can change meaning.      Schuyler Amor, MD 02/08/18 [id number]    Schuyler Amor, MD 02/09/18 [phone]

## 2018-02-09 DIAGNOSIS — I619 Nontraumatic intracerebral hemorrhage, unspecified: Secondary | ICD-10-CM | POA: Insufficient documentation

## 2018-02-15 ENCOUNTER — Emergency Department: Payer: Medicare Other

## 2018-02-15 ENCOUNTER — Emergency Department
Admission: EM | Admit: 2018-02-15 | Discharge: 2018-02-15 | Disposition: A | Discharge status: Home / Self Care | Payer: Medicare Other | Attending: Emergency Medicine | Admitting: Emergency Medicine

## 2018-02-15 ENCOUNTER — Encounter: Payer: Self-pay | Admitting: *Deleted

## 2018-02-15 ENCOUNTER — Other Ambulatory Visit: Payer: Self-pay

## 2018-02-15 DIAGNOSIS — E1140 Type 2 diabetes mellitus with diabetic neuropathy, unspecified: Secondary | ICD-10-CM | POA: Insufficient documentation

## 2018-02-15 DIAGNOSIS — Z8673 Personal history of transient ischemic attack (TIA), and cerebral infarction without residual deficits: Secondary | ICD-10-CM | POA: Diagnosis not present

## 2018-02-15 DIAGNOSIS — Z7982 Long term (current) use of aspirin: Secondary | ICD-10-CM | POA: Diagnosis not present

## 2018-02-15 DIAGNOSIS — E782 Mixed hyperlipidemia: Secondary | ICD-10-CM | POA: Diagnosis not present

## 2018-02-15 DIAGNOSIS — R4701 Aphasia: Secondary | ICD-10-CM | POA: Diagnosis present

## 2018-02-15 DIAGNOSIS — Z79899 Other long term (current) drug therapy: Secondary | ICD-10-CM | POA: Insufficient documentation

## 2018-02-15 DIAGNOSIS — Z87891 Personal history of nicotine dependence: Secondary | ICD-10-CM | POA: Insufficient documentation

## 2018-02-15 DIAGNOSIS — Z794 Long term (current) use of insulin: Secondary | ICD-10-CM | POA: Diagnosis not present

## 2018-02-15 DIAGNOSIS — R479 Unspecified speech disturbances: Secondary | ICD-10-CM | POA: Insufficient documentation

## 2018-02-15 DIAGNOSIS — I10 Essential (primary) hypertension: Secondary | ICD-10-CM | POA: Diagnosis not present

## 2018-02-15 LAB — URINALYSIS, COMPLETE (UACMP) WITH MICROSCOPIC
BACTERIA UA: NONE SEEN
Bilirubin Urine: NEGATIVE
GLUCOSE, UA: NEGATIVE mg/dL
Hgb urine dipstick: NEGATIVE
Ketones, ur: NEGATIVE mg/dL
Leukocytes, UA: NEGATIVE
Nitrite: NEGATIVE
PH: 6 (ref 5.0–8.0)
PROTEIN: NEGATIVE mg/dL
SPECIFIC GRAVITY, URINE: 1.009 (ref 1.005–1.030)
SQUAMOUS EPITHELIAL / LPF: NONE SEEN (ref 0–5)

## 2018-02-15 LAB — DIFFERENTIAL
BASOS ABS: 0 10*3/uL (ref 0–0.1)
BASOS PCT: 0 %
Eosinophils Absolute: 0.3 10*3/uL (ref 0–0.7)
Eosinophils Relative: 3 %
LYMPHS ABS: 1.9 10*3/uL (ref 1.0–3.6)
Lymphocytes Relative: 23 %
Monocytes Absolute: 0.8 10*3/uL (ref 0.2–1.0)
Monocytes Relative: 10 %
NEUTROS PCT: 64 %
Neutro Abs: 5.3 10*3/uL (ref 1.4–6.5)

## 2018-02-15 LAB — COMPREHENSIVE METABOLIC PANEL
ALK PHOS: 75 U/L (ref 38–126)
ALT: 25 U/L (ref 0–44)
ANION GAP: 8 (ref 5–15)
AST: 32 U/L (ref 15–41)
Albumin: 3.9 g/dL (ref 3.5–5.0)
BUN: 20 mg/dL (ref 8–23)
CALCIUM: 9.5 mg/dL (ref 8.9–10.3)
CO2: 28 mmol/L (ref 22–32)
CREATININE: 0.71 mg/dL (ref 0.61–1.24)
Chloride: 102 mmol/L (ref 98–111)
Glucose, Bld: 92 mg/dL (ref 70–99)
Potassium: 4.5 mmol/L (ref 3.5–5.1)
Sodium: 138 mmol/L (ref 135–145)
Total Bilirubin: 0.5 mg/dL (ref 0.3–1.2)
Total Protein: 6.9 g/dL (ref 6.5–8.1)

## 2018-02-15 LAB — CBC
HCT: 37.4 % — ABNORMAL LOW (ref 40.0–52.0)
Hemoglobin: 12.9 g/dL — ABNORMAL LOW (ref 13.0–18.0)
MCH: 30.8 pg (ref 26.0–34.0)
MCHC: 34.5 g/dL (ref 32.0–36.0)
MCV: 89.5 fL (ref 80.0–100.0)
Platelets: 189 10*3/uL (ref 150–440)
RBC: 4.18 MIL/uL — AB (ref 4.40–5.90)
RDW: 13.5 % (ref 11.5–14.5)
WBC: 8.3 10*3/uL (ref 3.8–10.6)

## 2018-02-15 LAB — PROTIME-INR
INR: 0.98
Prothrombin Time: 12.9 seconds (ref 11.4–15.2)

## 2018-02-15 LAB — APTT: aPTT: 31 seconds (ref 24–36)

## 2018-02-15 LAB — TROPONIN I

## 2018-02-15 MED ORDER — ACETAMINOPHEN 500 MG PO TABS
1000.0000 mg | ORAL_TABLET | Freq: Once | ORAL | Status: AC
  Administered 2018-02-15: 1000 mg via ORAL
  Filled 2018-02-15: qty 2

## 2018-02-15 MED ORDER — LORAZEPAM 2 MG/ML IJ SOLN
1.0000 mg | Freq: Once | INTRAMUSCULAR | Status: AC
  Administered 2018-02-15: 1 mg via INTRAVENOUS
  Filled 2018-02-15: qty 1

## 2018-02-15 MED ORDER — MORPHINE SULFATE (PF) 2 MG/ML IV SOLN
1.0000 mg | INTRAVENOUS | Status: DC | PRN
  Administered 2018-02-15: 1 mg via INTRAVENOUS
  Filled 2018-02-15: qty 1

## 2018-02-15 MED ORDER — LORAZEPAM 2 MG/ML IJ SOLN
INTRAMUSCULAR | Status: AC
  Administered 2018-02-15: 1 mg via INTRAVENOUS
  Filled 2018-02-15: qty 1

## 2018-02-15 MED ORDER — ACETAMINOPHEN 325 MG PO TABS
650.0000 mg | ORAL_TABLET | Freq: Once | ORAL | Status: AC
  Administered 2018-02-15: 650 mg via ORAL
  Filled 2018-02-15: qty 2

## 2018-02-15 MED ORDER — LORAZEPAM 2 MG/ML IJ SOLN
1.0000 mg | Freq: Once | INTRAMUSCULAR | Status: AC
  Administered 2018-02-15: 1 mg via INTRAVENOUS

## 2018-02-15 NOTE — ED Notes (Signed)
Pt reports head pains have worsened. MD at bedside with pt and pts family to assess and update. Pt cleaned by this RN and new condom cath applied.

## 2018-02-15 NOTE — ED Notes (Signed)
Pt resting with lights dimmed and door open. Family updated on medications. Bed locked and in lowest position with call bell in reach.

## 2018-02-15 NOTE — ED Notes (Signed)
Assessment

## 2018-02-15 NOTE — ED Triage Notes (Signed)
Pt discharged from hospital yesterday after a stroke on the 30th sudden onset of mumbled speech at peak at 1255 today. Speech clear upon arrival and per EMS son reports speech can be intermittently mumbled at baseline. Daughter now reporting the patient was not making sense. Daughter also reporting pt has been urinating more than normal since yesterday.   Son in room with pt when EMS arrievd reported pts arm jerking is normal since past stroke. Pt reporting head pains that started   CBG with EMS - [phone]

## 2018-02-15 NOTE — ED Notes (Signed)
Pt unable to lay still enough for the MRI to be performed. MD consulted and verbalized pt could have another 1 mg of ativan if needed to perform MRI.

## 2018-02-15 NOTE — ED Provider Notes (Signed)
[facility] Emergency Department Provider Note ____________________________________________   I have reviewed the triage vital signs and the triage nursing note.  HISTORY  Chief Complaint Aphasia   Historian Level 5 caveat is due to the patient is a limited historian  HPI CURREN MOHRMANN is a 77 y.o. male presents to the ED with complaint of slurred speech.  Apparently started on 12:55 PM.  This was per EMS report.  Patient  does not seem to know exactly what happened but states he was having trouble speaking.  He states he has a history of prior stroke years ago as well as a little bit of bleeding stroke a few days ago.  No reported new trauma.  Reporting mild headache.  He is not reporting any weakness or numbness.  He states his speech is pretty much normal now.  No chest pain.      Past Medical History:  Diagnosis Date  . Atypical chest pain    a. 04/2013 MV: nl EF, no ischemia.  Marland Kitchen BPH (benign prostatic hyperplasia)   . Chronic back pain   . Diabetes mellitus without complication (HCC)   . Diabetic neuropathy (HCC)   . Essential hypertension   . Falls   . GERD (gastroesophageal reflux disease)   . Memory disorder   . OSA (obstructive sleep apnea)   . Osteoarthritis    a. s/p bilat knee surgeries.  . Sinus bradycardia   . Stroke [facility])    a. ~ 2002 - no residual deficits;  b. 03/2015 Head CT: prior old infarcts and foci of small vesel dzs in pons bilat. ? bilat occipital lobe acute infarcts R>L.  Marland Kitchen Urinary incontinence     Patient Active Problem List   Diagnosis Date Noted  . Chronic pain syndrome 09/23/2017  . Hyperlipidemia, mixed 06/24/2017  . Benign prostatic hyperplasia without lower urinary tract symptoms 06/24/2017  . History of CVA (cerebrovascular accident) 06/24/2017  . Primary open angle glaucoma (POAG) of both eyes 06/24/2017  . Controlled type 2 diabetes mellitus without complication, with long-term current use of insulin (HCC)  06/24/2017  . 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 Pain of left femur 09/29/2016  . Pain medication agreement signed 08/05/2016  . Fall 10/03/2015  . Gait instability 10/03/2015  . TIA (transient ischemic attack) 10/03/2015  . Acute CVA (cerebrovascular accident) (HCC) 10/03/2015  . CVA (cerebral infarction) 10/03/2015  . Risk for falls 07/30/2015  . S/P cholecystectomy 07/30/2015  . Diabetic neuropathy (HCC) 07/17/2015  . Scoliosis 04/13/2015  . Fibrositis 04/13/2015  . Slurred speech 03/31/2015  . Elevated transaminase level 03/31/2015  . Gallstones 03/31/2015  . Lower extremity weakness 03/30/2015  . Cerebral infarction (HCC)   . Elevated troponin   . Uncontrolled type 2 diabetes mellitus with circulatory disorder (HCC)   . EKG abnormalities   . Emesis   . Gall bladder disease   . Other long term (current) drug therapy 02/13/2015  . Essential (primary) hypertension 09/18/2014  . Right sciatic nerve pain 09/15/2014  . Bradycardia 09/13/2014  . Cognitive disorder 04/05/2014  . Chronic pain associated with significant psychosocial dysfunction 01/17/2014  . Back ache 01/17/2014  . Esophageal stenosis 11/30/2013  . Can't get food down 11/30/2013  . Esophageal stricture 11/30/2013  . Chronic, continuous use of opioids 07/06/2013  . Cerebrovascular accident (CVA) (HCC) 05/19/2013  . Hypothyroid 05/19/2013  . Acid reflux 05/19/2013  . Stroke (HCC) 05/19/2013  . Gastroesophageal reflux disease without esophagitis 05/19/2013  . Spondylosis  of lumbar region without myelopathy or radiculopathy 01/11/2013  . SI (sacroiliac) pain 10/06/2012  . Osteoarthritis 10/06/2012  . Obstructive sleep apnea 10/06/2012  . Lumbar degenerative disc disease 10/06/2012  . Facet arthritis of lumbar region 10/06/2012  . Diabetes mellitus (HCC) 10/06/2012  . Diabetes (HCC) 10/06/2012  . Punctate keratitis 01/21/2012  . 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HCC) 01/21/2012  . Type 2 diabetes mellitus (HCC)  10/16/2010  . Tear film insufficiency 10/16/2010  . Senile corneal changes 10/16/2010  . Primary open-angle glaucoma 10/16/2010  . Moderate stage glaucoma 10/16/2010  . Background diabetic retinopathy (HCC) 10/16/2010  . 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 JOINT REPLACEMENT     bilateral knee replacements    Prior to Admission medications   Medication Sig Start Date End Date Taking? Authorizing Provider  amLODipine (NORVASC) 10 MG tablet Take 1 tablet (10 mg total) by mouth daily. Patient taking differently: Take 5 mg by mouth daily.  10/04/15  Yes Alford Highland, MD  aspirin 81 MG chewable tablet Chew 1 tablet (81 mg total) by mouth daily. 10/04/15  Yes Alford Highland, MD  atorvastatin (LIPITOR) 40 MG tablet Take 1 tablet (40 mg total) by mouth daily at 6 PM. 10/04/15  Yes Wieting, Richard, MD  cholecalciferol (VITAMIN D) 1000 units tablet Take 1,000 Units by mouth daily.   Yes [provider]  insulin NPH Human (HUMULIN N) 100 UNIT/ML injection 32 units before breakfast and 24 units before supper. Patient taking differently: Inject 20 Units into the skin 2 (two) times daily.  10/04/15  Yes Wieting, Richard, MD  losartan (COZAAR) 100 MG tablet Take 100 mg by mouth daily. 10/23/17  Yes [provider]  oxyCODONE-acetaminophen (PERCOCET) 10-325 MG tablet Take 1 tablet by mouth 2 (two) times daily as needed for pain.   Yes [provider]  pantoprazole (PROTONIX) 40 MG tablet Take 40 mg by mouth daily.  01/15/15 02/15/18 Yes [provider]  polyethylene glycol (MIRALAX / GLYCOLAX) packet Take 17 g by mouth daily.   Yes [provider]  tamsulosin (FLOMAX) 0.4 MG CAPS capsule Take 0.4 mg by mouth daily.   Yes [provider]  clopidogrel (PLAVIX) 75 MG tablet Take 1 tablet (75 mg total) by mouth daily. Patient not taking: Reported on 02/15/2018  10/04/15   Alford Highland, MD    No Known Allergies  Family History  Problem Relation Age of Onset  . Diabetes Mother   . Alzheimer's disease Father     Social History Social History   Tobacco Use  . Smoking status: Former Smoker    Packs/day: 1.00    Years: 25.00    Pack years: 25.00    Types: Cigarettes  . Smokeless tobacco: Never Used  Substance Use Topics  . Alcohol use: No  . Drug use: No    Review of Systems  Constitutional: Negative for fever. Eyes: Negative for visual changes. ENT: Negative for sore throat. Cardiovascular: Negative for chest pain. Respiratory: Negative for shortness of breath. Gastrointestinal: Negative for abdominal pain, vomiting and diarrhea. Genitourinary: Negative for dysuria. Musculoskeletal: Negative for back pain. Skin: Negative for rash. Neurological: Positive for mild headache as per HPI.    ____________________________________________   PHYSICAL EXAM:  VITAL SIGNS: ED Triage Vitals  Enc Vitals Group     BP 02/15/18 1357 (!) 146/67     Pulse Rate 02/15/18 1357 61     Resp 02/15/18 1357  16     Temp 02/15/18 1357 97.9 F (36.6 C)     Temp Source 02/15/18 1357 Oral     SpO2 02/15/18 1357 100 %     Weight 02/15/18 1401 174 lb 13.2 oz (79.3 kg)     Height 02/15/18 1401 5\' 6"  (1.676 m)     Head Circumference --      Peak Flow --      Pain Score --      Pain Loc --      Pain Edu? --      Excl. in GC? --      Constitutional: Alert and cooperative, limited historian.  HEENT      Head: Normocephalic and atraumatic.      Eyes: Conjunctivae are normal. Pupils equal and round.       Ears:         Nose: No congestion/rhinnorhea.      Mouth/Throat: Mucous membranes are moist.      Neck: No stridor. Cardiovascular/Chest: Normal rate, regular rhythm.  No murmurs, rubs, or gallops. Respiratory: Normal respiratory effort without tachypnea nor retractions. Breath sounds are clear and equal bilaterally. No  wheezes/rales/rhonchi. Gastrointestinal: Soft. No distention, no guarding, no rebound. Nontender.    Genitourinary/rectal:Deferred Musculoskeletal: Nontender with normal range of motion in all extremities. No joint effusions.  No lower extremity tenderness.  No edema. Neurologic: No slurred speech.  No facial droop.  Patient is moving 4 extremities.  4-5 strength in all 4 extremities.  No sensory deficits. Skin:  Skin is warm, dry and intact. No rash noted. Psychiatric: Mood and affect are normal. Speech and behavior are normal. Patient exhibits appropriate insight and judgment.   ____________________________________________  LABS (pertinent positives/negatives) I, Governor Rooks, MD the attending physician have reviewed the labs noted below.  Labs Reviewed  URINALYSIS, COMPLETE (UACMP) WITH MICROSCOPIC - Abnormal; Notable for the following components:      Result Value   Color, Urine STRAW (*)    APPearance CLEAR (*)    All other components within normal limits  CBC - Abnormal; Notable for the following components:   RBC 4.18 (*)    Hemoglobin 12.9 (*)    HCT 37.4 (*)    All other components within normal limits  PROTIME-INR  APTT  DIFFERENTIAL  COMPREHENSIVE METABOLIC PANEL  TROPONIN I    ____________________________________________    EKG I, Governor Rooks, MD, the attending physician have personally viewed and interpreted all ECGs.  51 bpm, uncertain rhythm looks like likely atrial fibrillation.  Narrow QRS.  Normal axis.  Nonspecific ST and T wave ____________________________________________  RADIOLOGY   Head without contrast  reviewed radiologist report: IMPRESSION: 1. No acute intracranial abnormality. 2. 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 3. Remote prior left ACA and PCA territory infarcts, mild atrophy and moderate chronic microvascular ischemic white matter disease.  MRI brain without contrast I reviewed the radiologist report.  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  Critical care was necessary to treat or prevent imminent or life-threatening deterioration.  Critical care was time spent personally by me on the following activities: development of treatment plan with patient and/or surrogate as well as nursing, discussions with consultants, evaluation of  patient's response to treatment, examination of patient, obtaining history from patient or surrogate, ordering and performing treatments and interventions, ordering and review of laboratory studies, ordering and review of radiographic studies, pulse oximetry and re-evaluation of patient's condition.    ____________________________________________  ED COURSE / ASSESSMENT AND PLAN  Pertinent labs & imaging results that were available during my care of the patient were reviewed by me and considered in my medical decision making (see chart for details).    Patient arrived by EMS with report of slurred speech which now seems to be improved.  I reviewed the patient's chart history indicating that on 02/08/2018 just a few days ago he had a mild head trauma and was found to have a small head bleed for which he was transferred to [facility] for definitive care.  Previous medication list include aspirin and Plavix, unclear whether or not he is still off of those at this point after the recent head bleed.  Patient was sent immediately for CT scan.  CT scan reviewed shows no acute findings.  I was able to speak with stepdaughter Felicia who knows the patient well, and states that the family that saw him and was concerned about his speech  described it to her and she felt like it was actually similar to how he has been and was not really slurred speech, but skipping certain words when he would speak a sentence.  In any case she feels like he is at his baseline.  She does note that he has been having a lot of movement of his right arm and right leg which she thinks could be due to anxiety or just post stroke symptoms.  She is not necessarily convinced that he had an acute change, however out of an abundance of caution given his history of prior ischemic stroke and then this most recent hemorrhagic stroke, we discussed obtaining MRI now to make sure there is no sign of new tiny ischemic stroke.  If this is reassuring without acute finding, we will plan on sending him home which is back to acute care rehab right now.  Patient care transferred to nurse practitioner Octaviano Glow who will follow up on pending MRI and disposition.     CONSULTATIONS:  None  Patient / Family / Caregiver informed of clinical course, medical decision-making process, and agree with plan.   I discussed return precautions, follow-up instructions, and discharge instructions with patient and/or family.  Discharge Instructions : You are evaluated for speech disturbance and your exam and evaluation are overall reassuring for no new emergency finding in the emergency department today.  Return to emergency department immediately for any worsening condition including worsening uncontrolled headache, fever, confusion or altered mental status, new neurologic deficits such as new weakness or new numbness or new speech problem or any other symptoms concerning to you.     ___________________________________________   FINAL CLINICAL IMPRESSION(S) / ED DIAGNOSES   Final diagnoses:  Speech disturbance, unspecified type      ___________________________________________         Note: This dictation was prepared with Dragon dictation. Any  transcriptional errors that result from this process are unintentional    Governor Rooks, MD 02/15/18 1746

## 2018-02-15 NOTE — Discharge Instructions (Addendum)
You are evaluated for speech disturbance and your exam and evaluation are overall reassuring for no new emergency finding in the emergency department today.  Return to emergency department immediately for any worsening condition including worsening uncontrolled headache, fever, confusion or altered mental status, new neurologic deficits such as new weakness or new numbness or new speech problem or any other symptoms concerning to you.

## 2018-02-15 NOTE — ED Notes (Signed)
Called and spoke with Joy at [facility] and informed her that pt was up for discharged and was leaving hospital and heading back there now.

## 2018-02-15 NOTE — ED Notes (Signed)
secretary informed of MRI order and paged MRI. rn able to speak to tech and update on order

## 2018-02-15 NOTE — ED Notes (Signed)
Patient transported to CT 

## 2018-02-23 ENCOUNTER — Other Ambulatory Visit
Admission: RE | Admit: 2018-02-23 | Discharge: 2018-02-23 | Disposition: A | Discharge status: Home / Self Care | Payer: Medicare Other | Source: Ambulatory Visit | Attending: Family Medicine | Admitting: Family Medicine

## 2018-02-23 DIAGNOSIS — B9689 Other specified bacterial agents as the cause of diseases classified elsewhere: Secondary | ICD-10-CM | POA: Insufficient documentation

## 2018-02-23 DIAGNOSIS — N390 Urinary tract infection, site not specified: Secondary | ICD-10-CM | POA: Diagnosis present

## 2018-02-23 LAB — URINALYSIS, COMPLETE (UACMP) WITH MICROSCOPIC
BILIRUBIN URINE: NEGATIVE
Bacteria, UA: NONE SEEN
GLUCOSE, UA: NEGATIVE mg/dL
HGB URINE DIPSTICK: NEGATIVE
KETONES UR: NEGATIVE mg/dL
LEUKOCYTES UA: NEGATIVE
Nitrite: NEGATIVE
PROTEIN: NEGATIVE mg/dL
Specific Gravity, Urine: 1.024 (ref 1.005–1.030)
pH: 5 (ref 5.0–8.0)

## 2018-02-25 LAB — URINE CULTURE

## 2018-03-04 ENCOUNTER — Ambulatory Visit: Payer: Medicare Other | Attending: Nurse Practitioner | Admitting: Nurse Practitioner

## 2018-03-04 ENCOUNTER — Encounter: Payer: Self-pay | Admitting: Nurse Practitioner

## 2018-03-04 ENCOUNTER — Other Ambulatory Visit: Payer: Self-pay

## 2018-03-04 VITALS — BP 129/65 | HR 55 | Temp 98.5°F | Resp 16 | Ht 65.0 in | Wt 173.0 lb

## 2018-03-04 DIAGNOSIS — Z87891 Personal history of nicotine dependence: Secondary | ICD-10-CM | POA: Diagnosis not present

## 2018-03-04 DIAGNOSIS — Z79899 Other long term (current) drug therapy: Secondary | ICD-10-CM | POA: Insufficient documentation

## 2018-03-04 DIAGNOSIS — Z7982 Long term (current) use of aspirin: Secondary | ICD-10-CM | POA: Insufficient documentation

## 2018-03-04 DIAGNOSIS — Z8673 Personal history of transient ischemic attack (TIA), and cerebral infarction without residual deficits: Secondary | ICD-10-CM | POA: Insufficient documentation

## 2018-03-04 DIAGNOSIS — E1140 Type 2 diabetes mellitus with diabetic neuropathy, unspecified: Secondary | ICD-10-CM | POA: Diagnosis not present

## 2018-03-04 DIAGNOSIS — E1159 Type 2 diabetes mellitus with other circulatory complications: Secondary | ICD-10-CM | POA: Insufficient documentation

## 2018-03-04 DIAGNOSIS — M419 Scoliosis, unspecified: Secondary | ICD-10-CM | POA: Insufficient documentation

## 2018-03-04 DIAGNOSIS — M5136 Other intervertebral disc degeneration, lumbar region: Secondary | ICD-10-CM | POA: Insufficient documentation

## 2018-03-04 DIAGNOSIS — E039 Hypothyroidism, unspecified: Secondary | ICD-10-CM | POA: Insufficient documentation

## 2018-03-04 DIAGNOSIS — Z794 Long term (current) use of insulin: Secondary | ICD-10-CM | POA: Insufficient documentation

## 2018-03-04 DIAGNOSIS — F09 Unspecified mental disorder due to known physiological condition: Secondary | ICD-10-CM | POA: Insufficient documentation

## 2018-03-04 DIAGNOSIS — N400 Enlarged prostate without lower urinary tract symptoms: Secondary | ICD-10-CM | POA: Insufficient documentation

## 2018-03-04 DIAGNOSIS — M47816 Spondylosis without myelopathy or radiculopathy, lumbar region: Secondary | ICD-10-CM | POA: Diagnosis not present

## 2018-03-04 DIAGNOSIS — E1139 Type 2 diabetes mellitus with other diabetic ophthalmic complication: Secondary | ICD-10-CM | POA: Insufficient documentation

## 2018-03-04 DIAGNOSIS — E782 Mixed hyperlipidemia: Secondary | ICD-10-CM | POA: Insufficient documentation

## 2018-03-04 DIAGNOSIS — K219 Gastro-esophageal reflux disease without esophagitis: Secondary | ICD-10-CM | POA: Diagnosis not present

## 2018-03-04 DIAGNOSIS — Z79891 Long term (current) use of opiate analgesic: Secondary | ICD-10-CM | POA: Diagnosis not present

## 2018-03-04 DIAGNOSIS — H401190 Primary open-angle glaucoma, unspecified eye, stage unspecified: Secondary | ICD-10-CM | POA: Diagnosis not present

## 2018-03-04 DIAGNOSIS — K8020 Calculus of gallbladder without cholecystitis without obstruction: Secondary | ICD-10-CM | POA: Diagnosis not present

## 2018-03-04 DIAGNOSIS — I10 Essential (primary) hypertension: Secondary | ICD-10-CM | POA: Diagnosis not present

## 2018-03-04 DIAGNOSIS — G4733 Obstructive sleep apnea (adult) (pediatric): Secondary | ICD-10-CM | POA: Diagnosis not present

## 2018-03-04 DIAGNOSIS — R9431 Abnormal electrocardiogram [ECG] [EKG]: Secondary | ICD-10-CM | POA: Diagnosis not present

## 2018-03-04 DIAGNOSIS — G894 Chronic pain syndrome: Secondary | ICD-10-CM | POA: Diagnosis not present

## 2018-03-04 DIAGNOSIS — M545 Low back pain: Secondary | ICD-10-CM | POA: Diagnosis present

## 2018-03-04 DIAGNOSIS — M533 Sacrococcygeal disorders, not elsewhere classified: Secondary | ICD-10-CM | POA: Insufficient documentation

## 2018-03-04 MED ORDER — OXYCODONE-ACETAMINOPHEN 7.5-325 MG PO TABS: 1.0000 | ORAL_TABLET | Freq: Four times a day (QID) | ORAL | 0 refills | Status: AC | PRN

## 2018-03-04 NOTE — Patient Instructions (Addendum)
You have been given 2 Rx for Percocet to last until 05/03/2018.____________________________________________________________________________________________  Medication Rules  Applies to: All patients receiving prescriptions (written or electronic).  Pharmacy of record: Pharmacy where electronic prescriptions will be sent. If written prescriptions are taken to a different pharmacy, please inform the nursing staff. The pharmacy listed in the electronic medical record should be the one where you would like electronic prescriptions to be sent.  Prescription refills: Only during scheduled appointments. Applies to both, written and electronic prescriptions.  NOTE: The following applies primarily to controlled substances (Opioid* Pain Medications).   Patient's responsibilities: 1. Pain Pills: Bring all pain pills to every appointment (except for procedure appointments). 2. Pill Bottles: Bring pills in original pharmacy bottle. Always bring newest bottle. Bring bottle, even if empty. 3. Medication refills: You are responsible for knowing and keeping track of what medications you need refilled. The day before your appointment, write a list of all prescriptions that need to be refilled. Bring that list to your appointment and give it to the admitting nurse. Prescriptions will be written only during appointments. If you forget a medication, it will not be "Called in", "Faxed", or "electronically sent". You will need to get another appointment to get these prescribed. 4. Prescription Accuracy: You are responsible for carefully inspecting your prescriptions before leaving our office. Have the discharge nurse carefully go over each prescription with you, before taking them home. Make sure that your name is accurately spelled, that your address is correct. Check the name and dose of your medication to make sure it is accurate. Check the number of pills, and the written instructions to make sure they are clear and  accurate. Make sure that you are given enough medication to last until your next medication refill appointment. 5. Taking Medication: Take medication as prescribed. Never take more pills than instructed. Never take medication more frequently than prescribed. Taking less pills or less frequently is permitted and encouraged, when it comes to controlled substances (written prescriptions).  6. Inform other Doctors: Always inform, all of your healthcare providers, of all the medications you take. 7. Pain Medication from other Providers: You are not allowed to accept any additional pain medication from any other Doctor or Healthcare provider. There are two exceptions to this rule. (see below) In the event that you require additional pain medication, you are responsible for notifying us, as stated below. 8. Medication Agreement: You are responsible for carefully reading and following our Medication Agreement. This must be signed before receiving any prescriptions from our practice. Safely store a copy of your signed Agreement. Violations to the Agreement will result in no further prescriptions. (Additional copies of our Medication Agreement are available upon request.) 9. Laws, Rules, & Regulations: All patients are expected to follow all [address]Federal and Walt DisneyState Laws, [employer] IndustriesStatutes, Rules, [employer]& Regulations. Ignorance of the Laws does not constitute a valid excuse. The use of any illegal substances is prohibited. 10. Adopted CDC guidelines & recommendations: Target dosing levels will be at or below 60 MME/day. Use of benzodiazepines** is not recommended.  Exceptions: There are only two exceptions to the rule of not receiving pain medications from other Healthcare Providers. 1. Exception #1 (Emergencies): In the event of an emergency (i.e.: accident requiring emergency care), you are allowed to receive additional pain medication. However, you are responsible for: As soon as you are able, call our office [phone], at any  time of the day or night, and leave a message stating your name, the date and nature  of the emergency, and the name and dose of the medication prescribed. In the event that your call is answered by a member of our staff, make sure to document and save the date, time, and the name of the person that took your information.  2. Exception #2 (Planned Surgery): In the event that you are scheduled by another doctor or dentist to have any type of surgery or procedure, you are allowed (for a period no longer than 30 days), to receive additional pain medication, for the acute post-op pain. However, in this case, you are responsible for picking up a copy of our "Post-op Pain Management for Surgeons" handout, and giving it to your surgeon or dentist. This document is available at our office, and does not require an appointment to obtain it. Simply go to our office during business hours (Monday-Thursday from 8:00 AM to 4:00 PM) (Friday 8:00 AM to 12:00 Noon) or if you have a scheduled appointment with [place], prior to your surgery, and ask for it by name. In addition, you will need to provide [place] with your name, name of your surgeon, type of surgery, and date of procedure or surgery.  *Opioid medications include: morphine, codeine, oxycodone, oxymorphone, hydrocodone, hydromorphone, meperidine, tramadol, tapentadol, buprenorphine, fentanyl, methadone. **Benzodiazepine medications include: diazepam (Valium), alprazolam (Xanax), clonazepam (Klonopine), lorazepam (Ativan), clorazepate (Tranxene), chlordiazepoxide (Librium), estazolam (Prosom), oxazepam (Serax), temazepam (Restoril), triazolam (Halcion) (Last updated: 08/13/2017) ____________________________________________________________________________________________

## 2018-03-04 NOTE — Progress Notes (Signed)
Patient's Name: David Sloan  MRN: [id number]  Referring Provider: Harlow Ohms, MD  DOB: 12-06-1940  PCP: Baxter Hire, MD  DOS: 03/04/2018  Note by: Vevelyn Francois NP  Service setting: Ambulatory outpatient  Specialty: Interventional Pain Management  Location: [facility] (AMB) Pain Management Facility    Patient type: Established    Primary Reason(s) for Visit: Encounter for prescription drug management. (Level of risk: moderate)  CC: Back Pain (lower)  HPI  David Sloan is a 77 y.o. year old, male patient, who comes today for a medication management evaluation. He has Lower extremity weakness; Cerebral infarction ([place]); Elevated troponin; Uncontrolled type 2 diabetes mellitus with circulatory disorder ([place]); Hyperlipidemia, mixed; EKG abnormalities; Emesis; Gall bladder disease; Slurred speech; Elevated transaminase level; Gallstones; Type 2 diabetes mellitus ([place]); Tear film insufficiency; Cerebrovascular accident (CVA) ([place]); Spondylosis of lumbar region without myelopathy or radiculopathy; SI (sacroiliac) pain; Senile corneal changes; Scoliosis; Right sciatic nerve pain; Punctate keratitis; Primary open-angle glaucoma; Other long term (current) drug therapy; Osteoarthritis; Obstructive sleep apnea; Fibrositis; Moderate stage glaucoma; Lumbar degenerative disc disease; Hypothyroid; Acid reflux; Facet arthritis of lumbar region; Essential (primary) hypertension; Esophageal stenosis; Can't get food down; 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[place]); Diabetes mellitus ([place]); Cognitive disorder; Chronic, continuous use of opioids; Chronic pain associated with significant psychosocial dysfunction; Bradycardia; Background diabetic retinopathy ([place]); Back ache; Fall; Gait instability; TIA (transient ischemic attack); Acute CVA (cerebrovascular accident) [facility]); CVA (cerebral infarction); Benign prostatic hyperplasia without lower urinary tract symptoms; Diabetic neuropathy ([place]); Herpes simplex infection of penis; History of CVA  (cerebrovascular accident); Pain medication agreement signed; Primary open angle glaucoma (POAG) of both eyes; Risk for falls; S/P cholecystectomy; Esophageal stricture; Stroke ([place]); 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 His primarily concern today is the Back Pain (lower)  Pain Assessment: Location: Left, Lower Back Radiating: denies Onset: More than a month ago Duration: Chronic pain Quality: Sharp Severity: 8 /10 (subjective, self-reported pain score)  Note: Reported level is compatible with observation. Clinically the patient looks like a 2/10 A 2/10 is viewed as "Mild to Moderate" and described as noticeable and distracting. Impossible to hide from other people. More frequent flare-ups. Still possible to adapt and function close to normal. It can be very annoying and may have occasional stronger flare-ups. With discipline, patients may get used to it and adapt.       When using our objective Pain Scale, levels between 6 and 10/10 are said to belong in an emergency room, as it progressively worsens from a 6/10, described as severely limiting, requiring emergency care not usually available at an outpatient pain management facility. At a 6/10 level, communication becomes difficult and requires great effort. Assistance to reach the emergency department may be required. Facial flushing and profuse sweating along with potentially dangerous increases in heart rate and blood pressure will be evident. Effect on ADL:   Timing: Constant Modifying factors: medication BP: 129/65  HR: (!) 55  David Sloan was last scheduled for an appointment on 12/15/2017 for  medication management. During today's appointment we reviewed David Sloan's chronic pain status, as well as his outpatient medication regimen.  He is currently in rehabilitation for ongoing physical therapy he admits that he suffered a TIA.  He admits that  he has some weakness in his right foot.  He admits that his back pain is constant.  He admits that he is unsure if he is getting any pain medication.  The patient  reports that he does not use drugs. His body mass index is 28.79 kg/m.  Further details on both, my assessment(s), as well as the proposed treatment plan, please see below.  Controlled Substance Pharmacotherapy Assessment REMS (Risk Evaluation and Mitigation Strategy)  Analgesic:Oxycodone/acetaminophen 7.5/325 twice a day MME/day:22.30m/day. WLandis Martins RN  03/04/2018 11:19 AM  Sign at close encounter Nursing Pain Medication Assessment:  Safety precautions to be maintained throughout the outpatient stay will include: orient to surroundings, keep bed in low position, maintain call bell within reach at all times, provide assistance with transfer out of bed and ambulation.  Medication Inspection Compliance: Mr. WThebeaudid not comply with our request to bring his pills to be counted. He was reminded that bringing the medication bottles, even when empty, is a requirement.  Medication: None brought in. Pill/Patch Count: None available to be counted. Bottle Appearance: No container available. Did not bring bottle(s) to appointment. Filled Date: N/A Last Medication intake:  In a rehab facility, unsure of last dose.   Pharmacokinetics: Liberation and absorption (onset of action): WNL Distribution (time to peak effect): WNL Metabolism and excretion (duration of action): WNL         Pharmacodynamics: Desired effects: Analgesia: David Sloan >50% benefit. Functional ability: Patient reports that medication allows him to accomplish basic ADLs Clinically meaningful improvement  in function (CMIF): Sustained CMIF goals met Perceived effectiveness: Described as relatively effective, allowing for increase in activities of daily living (ADL) Undesirable effects: Side-effects or Adverse reactions: None reported Monitoring: [place] PMP: Online review of the past 193-montheriod conducted. Compliant with practice rules and regulations Last UDS on record: Summary  Date Value Ref Range Status  12/15/2017 FINAL  Final    Comment:    ==================================================================== TOXASSURE SELECT 13 (MW) ==================================================================== Test                             Result       Flag       Units Drug Present and Declared for Prescription Verification   Oxycodone                      516          EXPECTED   ng/mg creat   Oxymorphone                    764          EXPECTED   ng/mg creat   Noroxycodone                   1579         EXPECTED   ng/mg creat   Noroxymorphone                 466          EXPECTED   ng/mg creat    Sources of oxycodone are scheduled prescription medications.    Oxymorphone, noroxycodone, and noroxymorphone are expected    metabolites of oxycodone. Oxymorphone is also available as a    scheduled prescription medication. ==================================================================== Test                      Result  Flag   Units      Ref Range   Creatinine              173              mg/dL      >=20 ==================================================================== Declared Medications:  The flagging and interpretation on this report are based on the  following declared medications.  Unexpected results may arise from  inaccuracies in the declared medications.  **Note: The testing scope of this panel includes these medications:  Oxycodone (Percocet)  **Note: The testing scope of this panel does not include following  reported medications:  Acetaminophen (Percocet)   Amlodipine Besylate  Aspirin (Aspirin 81)  Atorvastatin (Lipitor)  Clopidogrel (Plavix)  Cyanocobalamin  Finasteride (Proscar)  Insulin (Humulin)  Losartan (Cozaar)  Oxybutynin (Ditropan)  Pantoprazole (Protonix)  Tamsulosin (Flomax) ==================================================================== For clinical consultation, please call [phone]. ====================================================================    UDS interpretation: Compliant          Medication Assessment Form: Reviewed. Patient indicates being compliant with therapy Treatment compliance: Compliant Risk Assessment Profile: Aberrant behavior: See prior evaluations. None observed or detected today Comorbid factors increasing risk of overdose: See prior notes. No additional risks detected today Opioid risk tool (ORT) (Total Score): 0 Personal History of Substance Abuse (SUD-Substance use disorder):  Alcohol: Negative  Illegal Drugs: Negative  Rx Drugs: Negative  ORT Risk Level calculation: Low Risk Risk of substance use disorder (SUD): Low Opioid Risk Tool - 03/04/18 1116      Family History of Substance Abuse   Alcohol  Negative    Illegal Drugs  Negative    Rx Drugs  Negative      Personal History of Substance Abuse   Alcohol  Negative    Illegal Drugs  Negative    Rx Drugs  Negative      Age   Age between 61-45 years   No      History of Preadolescent Sexual Abuse   History of Preadolescent Sexual Abuse  Negative or Male      Psychological Disease   Psychological Disease  Negative    Depression  Negative      Total Score   Opioid Risk Tool Scoring  0    Opioid Risk Interpretation  Low Risk      ORT Scoring interpretation table:  Score <3 = Low Risk for SUD  Score between 4-7 = Moderate Risk for SUD  Score >8 = High Risk for Opioid Abuse   Risk Mitigation Strategies:  Patient Counseling: Covered Patient-Prescriber Agreement (PPA): Present and active  Notification to other  healthcare providers: Done  Pharmacologic Plan: No change in therapy, at this time.             Laboratory Chemistry  Inflammation Markers (CRP: Acute Phase) (ESR: Chronic Phase) No results found for: CRP, ESRSEDRATE, LATICACIDVEN                       Rheumatology Markers No results found for: RF, ANA, LABURIC, URICUR, LYMEIGGIGMAB, LYMEABIGMQN, HLAB27                      Renal Function Markers Lab Results  Component Value Date   BUN 20 02/15/2018   CREATININE 0.71 02/15/2018   GFRAA >60 02/15/2018   GFRNONAA >60 02/15/2018  Hepatic Function Markers Lab Results  Component Value Date   AST 32 02/15/2018   ALT 25 02/15/2018   ALBUMIN 3.9 02/15/2018   ALKPHOS 75 02/15/2018   LIPASE 23 04/23/2015   AMMONIA 19 03/31/2015                        Electrolytes Lab Results  Component Value Date   NA 138 02/15/2018   K 4.5 02/15/2018   CL 102 02/15/2018   CALCIUM 9.5 02/15/2018                        Neuropathy Markers Lab Results  Component Value Date   HGBA1C 6.4 (H) 10/03/2015                        CNS Tests No results found for: COLORCSF, APPEARCSF, RBCCOUNTCSF, WBCCSF, POLYSCSF, LYMPHSCSF, EOSCSF, PROTEINCSF, GLUCCSF, JCVIRUS, CSFOLI, IGGCSF                      Bone Pathology Markers No results found for: VD25OH, H139778, [id number], [id number], 25OHVITD1, 25OHVITD2, 25OHVITD3, TESTOFREE, TESTOSTERONE                       Coagulation Parameters Lab Results  Component Value Date   INR 0.98 02/15/2018   LABPROT 12.9 02/15/2018   APTT 31 02/15/2018   PLT 189 02/15/2018                        Cardiovascular Markers Lab Results  Component Value Date   CKTOTAL 237 (H) 05/06/2013   CKMB 3.6 05/06/2013   TROPONINI <0.03 02/15/2018   HGB 12.9 (L) 02/15/2018   HCT 37.4 (L) 02/15/2018                         CA Markers No results found for: CEA, CA125, LABCA2                      Note: Lab results reviewed.  Recent  Diagnostic Imaging Results  MR BRAIN WO CONTRAST CLINICAL DATA:  Ataxia with stroke suspected.  EXAM: MRI HEAD WITHOUT CONTRAST  TECHNIQUE: Multiplanar, multiecho pulse sequences of the brain and surrounding structures were obtained without intravenous contrast.  COMPARISON:  Head CT earlier today.  Brain MRI 10/03/2015  FINDINGS: Significantly motion degraded study. Only diffusion, FLAIR, and gradient imaging was acquired.  Susceptibility artifact from known recent left thalamic hematoma. There has been remote microhemorrhage in the roof of the left para median fourth ventricle. Remote infarcts in the left frontal and left occipital cortex. Chronic small vessel ischemia that is confluent in the deep cerebral white matter.  IMPRESSION: 1. Truncated and motion degraded exam due to patient agitation. 2. 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 No newly seen abnormality.  Electronically Signed   By: Monte Fantasia M.D.   On: 02/15/2018 17:34 CT HEAD WO CONTRAST CLINICAL DATA:  77 year old male with speech abnormality. Recent admission for acute hemorrhagic stroke.  EXAM: CT HEAD WITHOUT CONTRAST  TECHNIQUE: Contiguous axial images were obtained from the base of the skull through the vertex without intravenous contrast.  COMPARISON:  Recent head CT 02/08/2018  FINDINGS: Brain: Stable small focal hemorrhage in the left thalamus with surrounding edema. Persistent encephalomalacia in the left frontal lobe consistent with a prior left  ACA infarct. No definite new acute infarct, hemorrhage, mass, hydrocephalus or midline shift. Moderately advanced periventricular and subcortical white matter hypoattenuation consistent with chronic microvascular ischemic white matter disease. Remote lacunar infarct in the right centrum semiovale. Focal encephalomalacia in the left PCA territory consistent with remote infarct.  Vascular: Bilateral cavernous and  supraclinoid atherosclerotic vascular calcifications. No hyperdense vessel sign.  Skull: Normal. Negative for fracture or focal lesion.  Sinuses/Orbits: No acute finding.  Other: None.  IMPRESSION: 1. No acute intracranial abnormality. 2. 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 3. Remote prior left ACA and PCA territory infarcts, mild atrophy and moderate chronic microvascular ischemic white matter disease.  Electronically Signed   By: Jacqulynn Cadet M.D.   On: 02/15/2018 14:22  Complexity Note: Imaging results reviewed. Results shared with David Sloan, using Layman's terms.                         Meds   Current Outpatient Medications:  .  amLODipine (NORVASC) 10 MG tablet, Take 1 tablet (10 mg total) by mouth daily. (Patient taking differently: Take 5 mg by mouth daily. ), Disp: 30 tablet, Rfl: 0 .  aspirin 81 MG chewable tablet, Chew 1 tablet (81 mg total) by mouth daily., Disp: 30 tablet, Rfl: 0 .  atorvastatin (LIPITOR) 40 MG tablet, Take 1 tablet (40 mg total) by mouth daily at 6 PM., Disp: 30 tablet, Rfl: 0 .  cholecalciferol (VITAMIN D) 1000 units tablet, Take 1,000 Units by mouth daily., Disp: , Rfl:  .  insulin NPH Human (HUMULIN N) 100 UNIT/ML injection, 32 units before breakfast and 24 units before supper. (Patient taking differently: Inject 20 Units into the skin 2 (two) times daily. ), Disp: 10 mL, Rfl: 11 .  losartan (COZAAR) 100 MG tablet, Take 100 mg by mouth daily., Disp: , Rfl:  .  oxyCODONE-acetaminophen (PERCOCET) 10-325 MG tablet, Take 1 tablet by mouth 2 (two) times daily as needed for pain., Disp: , Rfl:  .  polyethylene glycol (MIRALAX / GLYCOLAX) packet, Take 17 g by mouth daily., Disp: , Rfl:  .  tamsulosin (FLOMAX) 0.4 MG CAPS capsule, Take 0.4 mg by mouth daily., Disp: , Rfl:  .  [START ON 04/03/2018] oxyCODONE-acetaminophen (PERCOCET) 7.5-325 MG tablet, Take 1 tablet by mouth every 6 (six) hours as needed for moderate pain or severe  pain., Disp: 90 tablet, Rfl: 0 .  oxyCODONE-acetaminophen (PERCOCET) 7.5-325 MG tablet, Take 1 tablet by mouth every 6 (six) hours as needed for moderate pain or severe pain., Disp: 90 tablet, Rfl: 0 .  pantoprazole (PROTONIX) 40 MG tablet, Take 40 mg by mouth daily. , Disp: , Rfl:   ROS  Constitutional: Denies any fever or chills Gastrointestinal: No reported hemesis, hematochezia, vomiting, or acute GI distress Musculoskeletal: Denies any acute onset joint swelling, redness, loss of ROM, or weakness Neurological: No reported episodes of acute onset apraxia, aphasia, dysarthria, agnosia, amnesia, paralysis, loss of coordination, or loss of consciousness  Allergies  David Sloan has No Known Allergies.  [place]  Drug: David Sloan  reports that he does not use drugs. Alcohol:  reports that he does not drink alcohol. Tobacco:  reports that he has quit smoking. His smoking use included cigarettes. He has a 25.00 pack-year smoking history. He has never used smokeless tobacco. Medical:  has a past medical history of Atypical chest pain, BPH (benign prostatic hyperplasia), Chronic back pain, Diabetes mellitus without complication ([place]), Diabetic neuropathy ([place]), Essential hypertension,  Falls, GERD (gastroesophageal reflux disease), Memory disorder, OSA (obstructive sleep apnea), Osteoarthritis, Sinus bradycardia, Stroke ([place]), and Urinary incontinence. Surgical: David Sloan  has a past surgical history that includes Joint replacement. Family: family history includes Alzheimer's disease in his father; Diabetes in his mother.  Constitutional Exam  General appearance: Well nourished, well developed, and well hydrated. In no apparent acute distress Vitals:   03/04/18 1108  BP: 129/65  Pulse: (!) 55  Resp: 16  Temp: 98.5 F (36.9 C)  TempSrc: Oral  SpO2: 100%  Weight: 173 lb (78.5 kg)  Height: 5' 5" (1.651 m)   BMI Assessment: Estimated body mass index is 28.79 kg/m as calculated from the  following:   Height as of this encounter: 5' 5" (1.651 m).   Weight as of this encounter: 173 lb (78.5 kg). Psych/Mental status: Alert, oriented x 3 (person, place, & time)       Eyes: PERLA Respiratory: No evidence of acute respiratory distress   Lumbar Spine Area Exam  Skin & Axial Inspection: No masses, redness, or swelling Alignment: Symmetrical Functional ROM: Unrestricted ROM       Stability: No instability detected Muscle Tone/Strength: Functionally intact. No obvious neuro-muscular anomalies detected. Sensory (Neurological): Unimpaired Palpation: Tender       Provocative Tests: Hyperextension/rotation test: deferred today       Lumbar quadrant test (Kemp's test): deferred today       Lateral bending test: deferred today       Patrick's Maneuver: deferred today                   FABER test: deferred today                   S-I anterior distraction/compression test: deferred today         S-I lateral compression test: deferred today         S-I Thigh-thrust test: deferred today         S-I Gaenslen's test: deferred today          Gait & Posture Assessment  Ambulation: Patient ambulates using a wheel chair Gait: Relatively normal for age and body habitus Posture: WNL   Lower Extremity Exam    Side: Right lower extremity  Side: Left lower extremity  Stability: No instability observed          Stability: No instability observed          Skin & Extremity Inspection: Skin color, temperature, and hair growth are WNL. No peripheral edema or cyanosis. No masses, redness, swelling, asymmetry, or associated skin lesions. No contractures.  Skin & Extremity Inspection: Skin color, temperature, and hair growth are WNL. No peripheral edema or cyanosis. No masses, redness, swelling, asymmetry, or associated skin lesions. No contractures.  Functional ROM: Decreased ROM                  Functional ROM: Adequate ROM                  Muscle Tone/Strength: Movement possible against some  resistance (4/5)  Muscle Tone/Strength: Movement possible against some resistance (4/5)  Sensory (Neurological): Unimpaired  Sensory (Neurological): Unimpaired  Palpation: No palpable anomalies  Palpation: No palpable anomalies   Assessment  Primary Diagnosis & Pertinent Problem List: The primary encounter diagnosis was Spondylosis of lumbar region without myelopathy or radiculopathy. Diagnoses of SI (sacroiliac) pain, Chronic pain syndrome, and Long term current use of opiate analgesic were also pertinent to this visit.  Status Diagnosis  Persistent Persistent Controlled 1. Spondylosis of lumbar region without myelopathy or radiculopathy   2. SI (sacroiliac) pain   3. Chronic pain syndrome   4. Long term current use of opiate analgesic     Problems updated and reviewed during this visit: Problem  Long Term Current Use of Opiate Analgesic  Nontraumatic Intracerebral Hemorrhage (Hcc)   Plan of Care  Pharmacotherapy (Medications Ordered): Meds ordered this encounter  Medications  . oxyCODONE-acetaminophen (PERCOCET) 7.5-325 MG tablet    Sig: Take 1 tablet by mouth every 6 (six) hours as needed for moderate pain or severe pain.    Dispense:  90 tablet    Refill:  0    Do not add this medication to the electronic "Automatic Refill" notification system. Patient may have prescription filled one day early if pharmacy is closed on scheduled refill date.    Order Specific Question:   Supervising Provider    Answer:   Milinda Pointer [phone]  . oxyCODONE-acetaminophen (PERCOCET) 7.5-325 MG tablet    Sig: Take 1 tablet by mouth every 6 (six) hours as needed for moderate pain or severe pain.    Dispense:  90 tablet    Refill:  0    Do not add this medication to the electronic "Automatic Refill" notification system. Patient may have prescription filled one day early if pharmacy is closed on scheduled refill date.    Order Specific Question:   Supervising Provider    Answer:   Milinda Pointer [[id number]]   New Prescriptions   No medications on file   Medications administered today: David Sloan had no medications administered during this visit. Lab-work, procedure(s), and/or referral(s): No orders of the defined types were placed in this encounter.  Imaging and/or referral(s): None  Interventional therapies: Planned, scheduled, and/or pending:   Not at this time.    Provider-requested follow-up: Return in about 2 months (around 05/04/2018) for MedMgmt with Me Donella Stade [facility] Pace).  Future Appointments  Date Time Provider [place]  04/29/2018 10:30 AM Vevelyn Francois, NP [facility] None   Primary Care Physician: Baxter Hire, MD Location: [facility] Outpatient Pain Management Facility Note by: Vevelyn Francois NP Date: 03/04/2018; Time: 12:15 PM  Pain Score Disclaimer: We use the NRS-11 scale. This is a self-reported, subjective measurement of pain severity with only modest accuracy. It is used primarily to identify changes within a particular patient. It must be understood that outpatient pain scales are significantly less accurate that those used for research, where they can be applied under ideal controlled circumstances with minimal exposure to variables. In reality, the score is likely to be a combination of pain intensity and pain affect, where pain affect describes the degree of emotional arousal or changes in action readiness caused by the sensory experience of pain. Factors such as social and work situation, setting, emotional state, anxiety levels, expectation, and prior pain experience may influence pain perception and show large inter-individual differences that may also be affected by time variables.  Patient instructions provided during this appointment: Patient Instructions  You have been given 2 Rx for Percocet to last until 05/03/2018.____________________________________________________________________________________________  Medication  Rules  Applies to: All patients receiving prescriptions (written or electronic).  Pharmacy of record: Pharmacy where electronic prescriptions will be sent. If written prescriptions are taken to a different pharmacy, please inform the nursing staff. The pharmacy listed in the electronic medical record should be the one where you would like electronic prescriptions to be sent.  Prescription  refills: Only during scheduled appointments. Applies to both, written and electronic prescriptions.  NOTE: The following applies primarily to controlled substances (Opioid* Pain Medications).   Patient's responsibilities: 1. Pain Pills: Bring all pain pills to every appointment (except for procedure appointments). 2. Pill Bottles: Bring pills in original pharmacy bottle. Always bring newest bottle. Bring bottle, even if empty. 3. Medication refills: You are responsible for knowing and keeping track of what medications you need refilled. The day before your appointment, write a list of all prescriptions that need to be refilled. Bring that list to your appointment and give it to the admitting nurse. Prescriptions will be written only during appointments. If you forget a medication, it will not be "Called in", "Faxed", or "electronically sent". You will need to get another appointment to get these prescribed. 4. Prescription Accuracy: You are responsible for carefully inspecting your prescriptions before leaving our office. Have the discharge nurse carefully go over each prescription with you, before taking them home. Make sure that your name is accurately spelled, that your address is correct. Check the name and dose of your medication to make sure it is accurate. Check the number of pills, and the written instructions to make sure they are clear and accurate. Make sure that you are given enough medication to last until your next medication refill appointment. 5. Taking Medication: Take medication as prescribed. Never  take more pills than instructed. Never take medication more frequently than prescribed. Taking less pills or less frequently is permitted and encouraged, when it comes to controlled substances (written prescriptions).  6. Inform other Doctors: Always inform, all of your healthcare providers, of all the medications you take. 7. Pain Medication from other Providers: You are not allowed to accept any additional pain medication from any other Doctor or Healthcare provider. There are two exceptions to this rule. (see below) In the event that you require additional pain medication, you are responsible for notifying us, as stated below. 8. Medication Agreement: You are responsible for carefully reading and following our Medication Agreement. This must be signed before receiving any prescriptions from our practice. Safely store a copy of your signed Agreement. Violations to the Agreement will result in no further prescriptions. (Additional copies of our Medication Agreement are available upon request.) 9. Laws, Rules, & Regulations: All patients are expected to follow all Federal and [employer], [employer], Rules, [employer]. Ignorance of the Laws does not constitute a valid excuse. The use of any illegal substances is prohibited. 10. Adopted CDC guidelines & recommendations: Target dosing levels will be at or below 60 MME/day. Use of benzodiazepines** is not recommended.  Exceptions: There are only two exceptions to the rule of not receiving pain medications from other Healthcare Providers. 1. Exception #1 (Emergencies): In the event of an emergency (i.e.: accident requiring emergency care), you are allowed to receive additional pain medication. However, you are responsible for: As soon as you are able, call our office [phone], at any time of the day or night, and leave a message stating your name, the date and nature of the emergency, and the name and dose of the medication prescribed. In the event that  your call is answered by a member of our staff, make sure to document and save the date, time, and the name of the person that took your information.  2. Exception #2 (Planned Surgery): In the event that you are scheduled by another doctor or dentist to have any type of surgery or procedure, you are  allowed (for a period no longer than 30 days), to receive additional pain medication, for the acute post-op pain. However, in this case, you are responsible for picking up a copy of our "Post-op Pain Management for Surgeons" handout, and giving it to your surgeon or dentist. This document is available at our office, and does not require an appointment to obtain it. Simply go to our office during business hours (Monday-Thursday from 8:00 AM to 4:00 PM) (Friday 8:00 AM to 12:00 Noon) or if you have a scheduled appointment with [place], prior to your surgery, and ask for it by name. In addition, you will need to provide [place] with your name, name of your surgeon, type of surgery, and date of procedure or surgery.  *Opioid medications include: morphine, codeine, oxycodone, oxymorphone, hydrocodone, hydromorphone, meperidine, tramadol, tapentadol, buprenorphine, fentanyl, methadone. **Benzodiazepine medications include: diazepam (Valium), alprazolam (Xanax), clonazepam (Klonopine), lorazepam (Ativan), clorazepate (Tranxene), chlordiazepoxide (Librium), estazolam (Prosom), oxazepam (Serax), temazepam (Restoril), triazolam (Halcion) (Last updated: 08/13/2017) ____________________________________________________________________________________________

## 2018-03-04 NOTE — Progress Notes (Signed)
Nursing Pain Medication Assessment:  Safety precautions to be maintained throughout the outpatient stay will include: orient to surroundings, keep bed in low position, maintain call bell within reach at all times, provide assistance with transfer out of bed and ambulation.  Medication Inspection Compliance: Mr. David Sloan did not comply with our request to bring his pills to be counted. He was reminded that bringing the medication bottles, even when empty, is a requirement.  Medication: None brought in. Pill/Patch Count: None available to be counted. Bottle Appearance: No container available. Did not bring bottle(s) to appointment. Filled Date: N/A Last Medication intake:  In a rehab facility, unsure of last dose.

## 2018-03-22 ENCOUNTER — Telehealth: Payer: Self-pay | Admitting: *Deleted

## 2018-03-22 NOTE — Telephone Encounter (Signed)
Attempted to call patient at the number requested. No answer and was unable to leave a voice mail. Chart Reviewed and patient was prescribed Percocet 90 pills on 04/03/18. Patient has another prescription for 05/03/18 Crystal will probably not re write.

## 2018-03-22 NOTE — Telephone Encounter (Signed)
States he cannot locate his prescriptions for Percocet. Patient reminded of Medication Agreement, scripts cannot be replaced of lost, stolen, or otherwise missing.

## 2018-04-03 IMAGING — US US CAROTID DUPLEX BILAT
1 series · 13 of 24 positions shown · non-contrast
Comparison: Prior duplex carotid ultrasound 03/30/2015 ; prior CTA
neck 03/30/2015

CLINICAL DATA: 75-year-old male with symptoms of transient ischemic
attack

EXAM:
BILATERAL CAROTID DUPLEX ULTRASOUND
TECHNIQUE: Gray scale imaging, color Doppler and duplex ultrasound were
performed of bilateral carotid and vertebral arteries in the neck.

[Series 1: us carotid duplex bilat · 0.06mm/px · 13 of 65 slices shown]
[im 1/65]
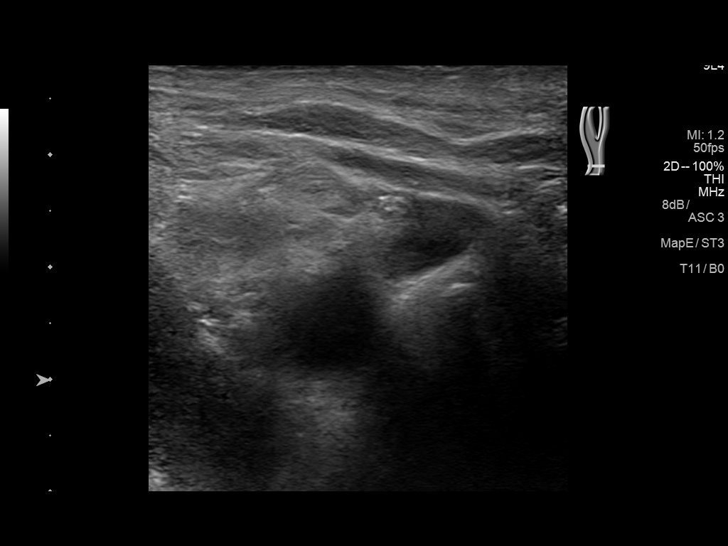
[im 6/65]
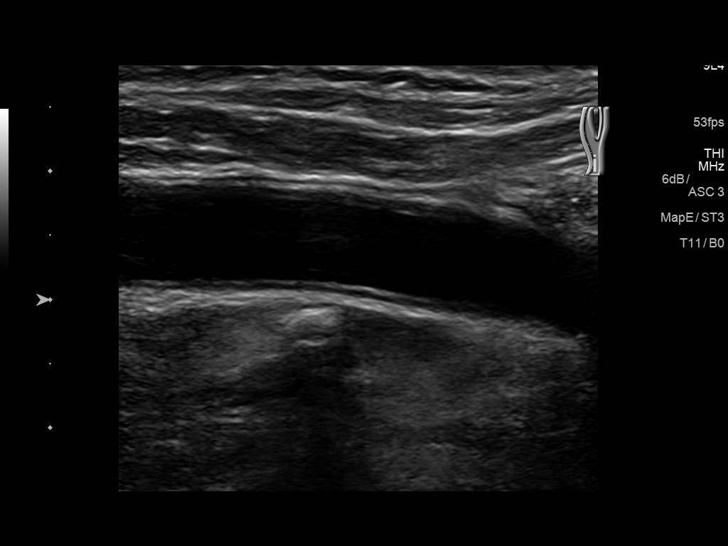
[im 12/65]
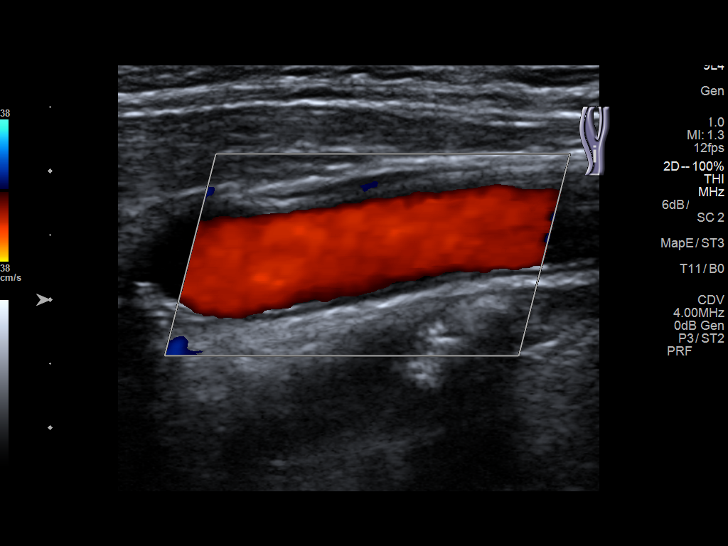
[im 17/65]
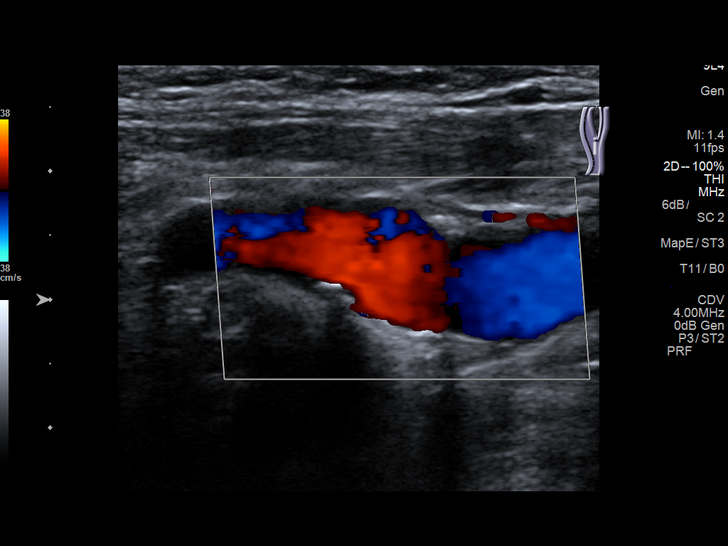
[im 23/65]
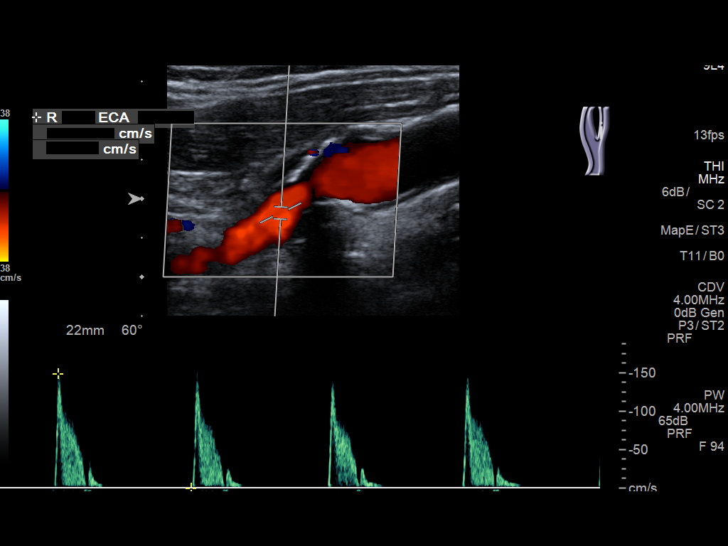
[im 28/65]
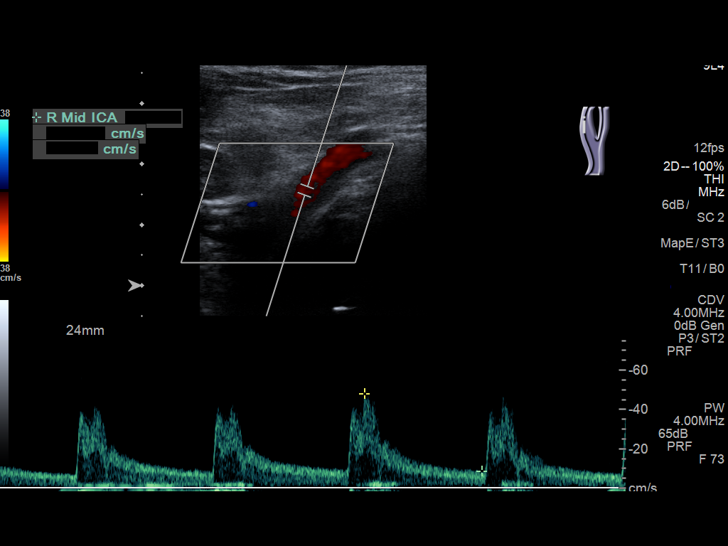
[im 34/65]
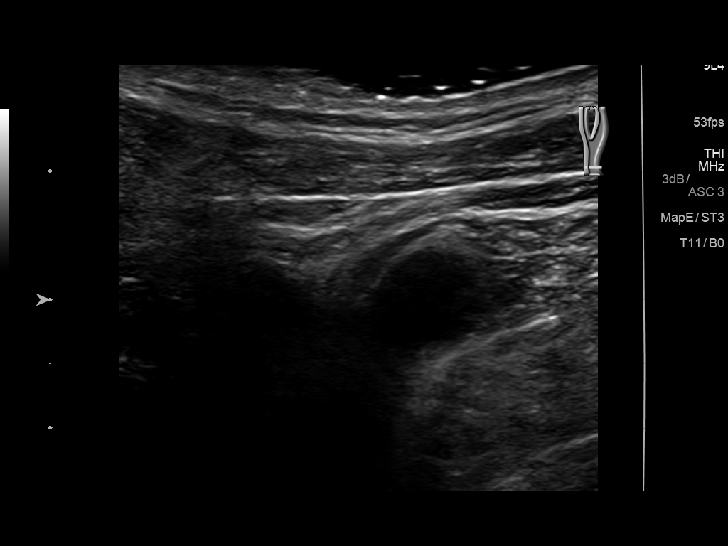
[im 37/65]
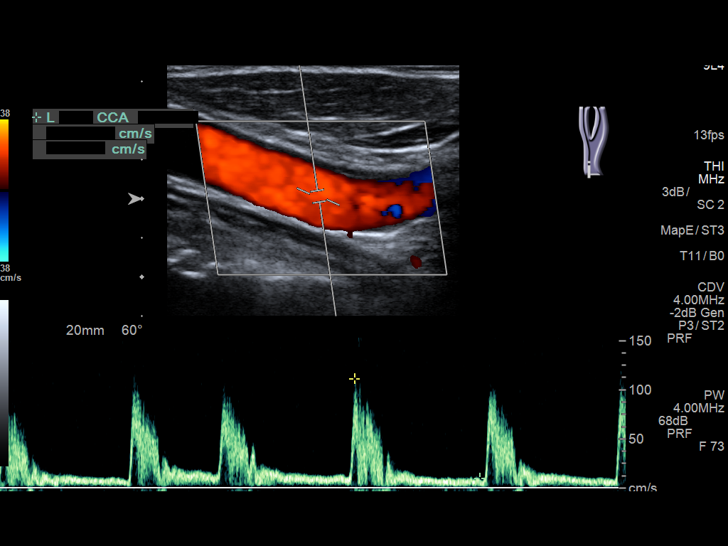
[im 42/65]
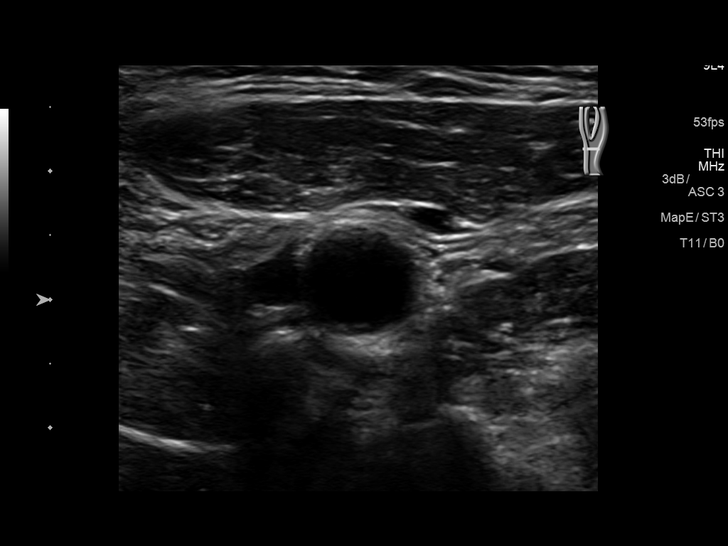
[im 48/65]
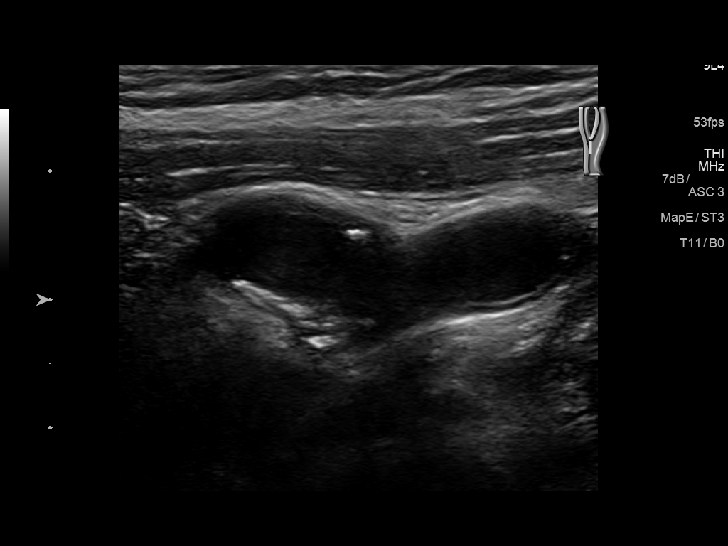
[im 53/65]
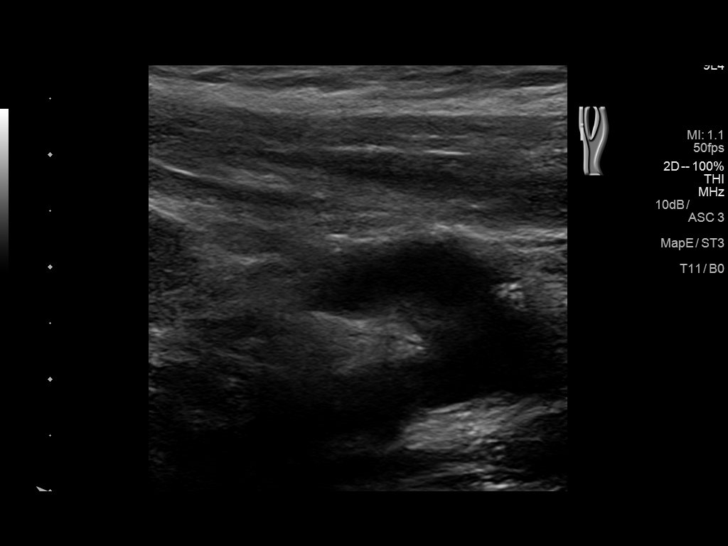
[im 59/65]
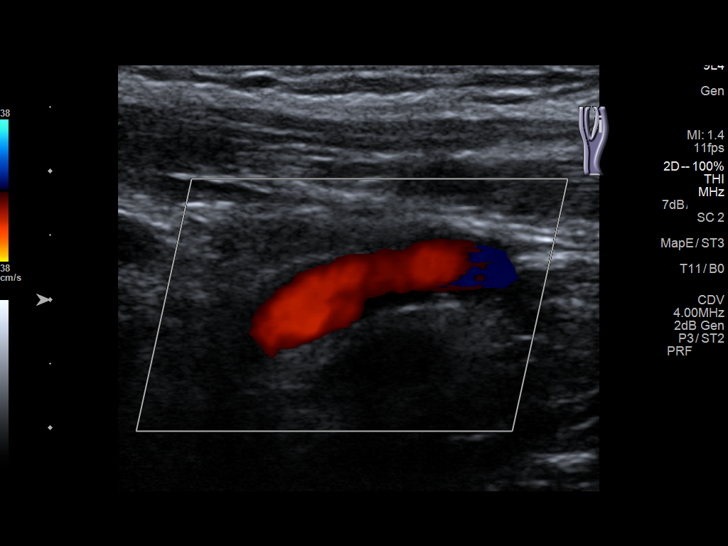
[im 65/65]
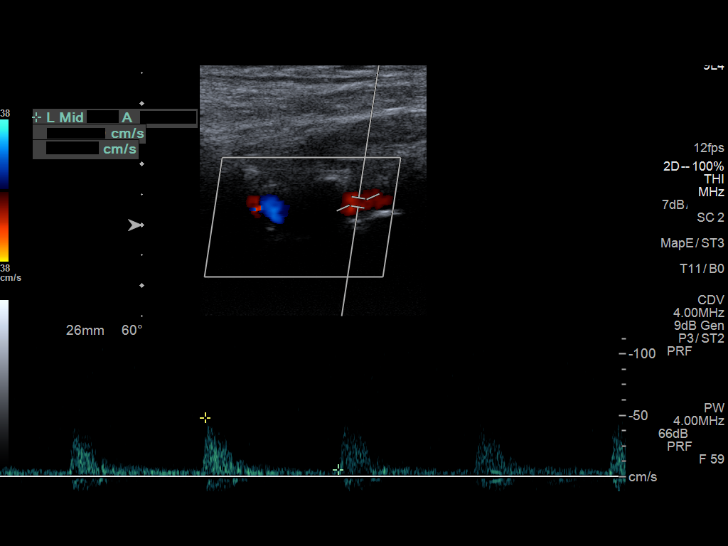

[13 of 24 positions shown; findings below may reference images not displayed]

FINDINGS: Criteria: Quantification of carotid stenosis is based on velocity
parameters that correlate the residual internal carotid diameter
with NASCET-based stenosis levels, using the diameter of the distal
internal carotid lumen as the denominator for stenosis measurement.

The following velocity measurements were obtained:

RIGHT

ICA:  60/8 cm/sec

CCA:  97/8 cm/sec

SYSTOLIC ICA/CCA RATIO:

DIASTOLIC ICA/CCA RATIO:

ECA:  149 cm/sec

LEFT

ICA:  84/12 cm/sec

CCA:  75/5 cm/sec

SYSTOLIC ICA/CCA RATIO:

DIASTOLIC ICA/CCA RATIO:

ECA:  85 cm/sec

RIGHT CAROTID ARTERY: Stable appearance of mild smooth heterogeneous
atherosclerotic plaque in the carotid bifurcation and proximal
internal carotid artery. By peak systolic velocity criteria the
estimated stenosis remains less than 50%.

RIGHT VERTEBRAL ARTERY:  Patent with normal antegrade flow.

LEFT CAROTID ARTERY: Stable appearance of mild heterogeneous but
smooth atherosclerotic plaque in the proximal internal carotid
artery. By peak systolic velocity criteria the estimated stenosis
remains less than 50%.

LEFT VERTEBRAL ARTERY:  Patent with normal antegrade flow.
IMPRESSION: 1. Mild (1-49%) stenosis proximal right internal carotid artery
secondary to smooth heterogeneous atherosclerotic plaque.
2. Mild (1-49%) stenosis proximal left internal carotid artery
secondary to smooth, heterogeneous atherosclerotic plaque.
3. Vertebral arteries are patent with normal antegrade flow.

## 2018-04-22 ENCOUNTER — Encounter: Payer: Self-pay | Admitting: Emergency Medicine

## 2018-04-22 ENCOUNTER — Inpatient Hospital Stay
Admission: EM | Admit: 2018-04-22 | Discharge: 2018-04-24 | DRG: 916 | Disposition: A | Discharge status: Skilled Nursing Facility | Payer: Medicare Other | Attending: Internal Medicine | Admitting: Internal Medicine

## 2018-04-22 ENCOUNTER — Other Ambulatory Visit: Payer: Self-pay

## 2018-04-22 DIAGNOSIS — J384 Edema of larynx: Secondary | ICD-10-CM | POA: Diagnosis present

## 2018-04-22 DIAGNOSIS — E782 Mixed hyperlipidemia: Secondary | ICD-10-CM | POA: Diagnosis present

## 2018-04-22 DIAGNOSIS — Z87891 Personal history of nicotine dependence: Secondary | ICD-10-CM

## 2018-04-22 DIAGNOSIS — E1140 Type 2 diabetes mellitus with diabetic neuropathy, unspecified: Secondary | ICD-10-CM | POA: Diagnosis present

## 2018-04-22 DIAGNOSIS — T465X5A Adverse effect of other antihypertensive drugs, initial encounter: Secondary | ICD-10-CM | POA: Diagnosis present

## 2018-04-22 DIAGNOSIS — E039 Hypothyroidism, unspecified: Secondary | ICD-10-CM | POA: Diagnosis present

## 2018-04-22 DIAGNOSIS — Z794 Long term (current) use of insulin: Secondary | ICD-10-CM | POA: Diagnosis not present

## 2018-04-22 DIAGNOSIS — Z7982 Long term (current) use of aspirin: Secondary | ICD-10-CM | POA: Diagnosis not present

## 2018-04-22 DIAGNOSIS — Z833 Family history of diabetes mellitus: Secondary | ICD-10-CM | POA: Diagnosis not present

## 2018-04-22 DIAGNOSIS — Z23 Encounter for immunization: Secondary | ICD-10-CM | POA: Diagnosis present

## 2018-04-22 DIAGNOSIS — T783XXA Angioneurotic edema, initial encounter: Secondary | ICD-10-CM | POA: Diagnosis present

## 2018-04-22 DIAGNOSIS — A6001 Herpesviral infection of penis: Secondary | ICD-10-CM | POA: Diagnosis present

## 2018-04-22 DIAGNOSIS — G4733 Obstructive sleep apnea (adult) (pediatric): Secondary | ICD-10-CM | POA: Diagnosis present

## 2018-04-22 DIAGNOSIS — Z8673 Personal history of transient ischemic attack (TIA), and cerebral infarction without residual deficits: Secondary | ICD-10-CM | POA: Diagnosis not present

## 2018-04-22 DIAGNOSIS — N400 Enlarged prostate without lower urinary tract symptoms: Secondary | ICD-10-CM | POA: Diagnosis present

## 2018-04-22 DIAGNOSIS — Z7902 Long term (current) use of antithrombotics/antiplatelets: Secondary | ICD-10-CM

## 2018-04-22 DIAGNOSIS — Z79899 Other long term (current) drug therapy: Secondary | ICD-10-CM

## 2018-04-22 DIAGNOSIS — K219 Gastro-esophageal reflux disease without esophagitis: Secondary | ICD-10-CM | POA: Diagnosis present

## 2018-04-22 DIAGNOSIS — Z96653 Presence of artificial knee joint, bilateral: Secondary | ICD-10-CM | POA: Diagnosis present

## 2018-04-22 DIAGNOSIS — G894 Chronic pain syndrome: Secondary | ICD-10-CM | POA: Diagnosis present

## 2018-04-22 DIAGNOSIS — R32 Unspecified urinary incontinence: Secondary | ICD-10-CM | POA: Diagnosis present

## 2018-04-22 DIAGNOSIS — H401130 Primary open-angle glaucoma, bilateral, stage unspecified: Secondary | ICD-10-CM | POA: Diagnosis present

## 2018-04-22 DIAGNOSIS — I10 Essential (primary) hypertension: Secondary | ICD-10-CM | POA: Diagnosis present

## 2018-04-22 DIAGNOSIS — R1310 Dysphagia, unspecified: Secondary | ICD-10-CM | POA: Diagnosis present

## 2018-04-22 DIAGNOSIS — Z9049 Acquired absence of other specified parts of digestive tract: Secondary | ICD-10-CM | POA: Diagnosis not present

## 2018-04-22 LAB — CBC WITH DIFFERENTIAL/PLATELET
Abs Immature Granulocytes: 0.07 10*3/uL (ref 0.00–0.07)
BASOS PCT: 0 %
Basophils Absolute: 0 10*3/uL (ref 0.0–0.1)
EOS ABS: 0.3 10*3/uL (ref 0.0–0.5)
EOS PCT: 3 %
HCT: 37.8 % — ABNORMAL LOW (ref 39.0–52.0)
Hemoglobin: 12.5 g/dL — ABNORMAL LOW (ref 13.0–17.0)
Immature Granulocytes: 1 %
Lymphocytes Relative: 15 %
Lymphs Abs: 1.7 10*3/uL (ref 0.7–4.0)
MCH: 30.3 pg (ref 26.0–34.0)
MCHC: 33.1 g/dL (ref 30.0–36.0)
MCV: 91.7 fL (ref 80.0–100.0)
MONOS PCT: 12 %
Monocytes Absolute: 1.3 10*3/uL — ABNORMAL HIGH (ref 0.1–1.0)
Neutro Abs: 7.9 10*3/uL — ABNORMAL HIGH (ref 1.7–7.7)
Neutrophils Relative %: 69 %
PLATELETS: 220 10*3/uL (ref 150–400)
RBC: 4.12 MIL/uL — ABNORMAL LOW (ref 4.22–5.81)
RDW: 13.4 % (ref 11.5–15.5)
WBC: 11.3 10*3/uL — ABNORMAL HIGH (ref 4.0–10.5)
nRBC: 0 % (ref 0.0–0.2)

## 2018-04-22 LAB — BASIC METABOLIC PANEL
Anion gap: 8 (ref 5–15)
BUN: 16 mg/dL (ref 8–23)
CALCIUM: 9.5 mg/dL (ref 8.9–10.3)
CO2: 29 mmol/L (ref 22–32)
CREATININE: 0.93 mg/dL (ref 0.61–1.24)
Chloride: 103 mmol/L (ref 98–111)
GLUCOSE: 73 mg/dL (ref 70–99)
Potassium: 4.1 mmol/L (ref 3.5–5.1)
Sodium: 140 mmol/L (ref 135–145)

## 2018-04-22 MED ORDER — ENOXAPARIN SODIUM 40 MG/0.4ML ~~LOC~~ SOLN
40.0000 mg | SUBCUTANEOUS | Status: DC
  Administered 2018-04-22 – 2018-04-23 (×2): 40 mg via SUBCUTANEOUS
  Filled 2018-04-22 (×2): qty 0.4

## 2018-04-22 MED ORDER — ACETAMINOPHEN 325 MG PO TABS
650.0000 mg | ORAL_TABLET | Freq: Four times a day (QID) | ORAL | Status: DC | PRN
  Administered 2018-04-22 – 2018-04-23 (×2): 650 mg via ORAL
  Filled 2018-04-22 (×2): qty 2

## 2018-04-22 MED ORDER — SODIUM CHLORIDE 0.9 % IV SOLN
25.0000 mg | Freq: Three times a day (TID) | INTRAVENOUS | Status: DC | PRN
  Filled 2018-04-22: qty 0.5

## 2018-04-22 MED ORDER — METHYLPREDNISOLONE SODIUM SUCC 125 MG IJ SOLR
125.0000 mg | Freq: Once | INTRAMUSCULAR | Status: AC
  Administered 2018-04-22: 125 mg via INTRAVENOUS
  Filled 2018-04-22: qty 2

## 2018-04-22 MED ORDER — DIPHENHYDRAMINE HCL 50 MG/ML IJ SOLN
25.0000 mg | Freq: Once | INTRAMUSCULAR | Status: AC
  Administered 2018-04-22: 25 mg via INTRAVENOUS
  Filled 2018-04-22: qty 1

## 2018-04-22 MED ORDER — ONDANSETRON HCL 4 MG/2ML IJ SOLN: 4.0000 mg | Freq: Four times a day (QID) | INTRAMUSCULAR | Status: DC | PRN

## 2018-04-22 MED ORDER — ONDANSETRON HCL 4 MG PO TABS: 4.0000 mg | ORAL_TABLET | Freq: Four times a day (QID) | ORAL | Status: DC | PRN

## 2018-04-22 MED ORDER — METHYLPREDNISOLONE SODIUM SUCC 125 MG IJ SOLR
60.0000 mg | Freq: Four times a day (QID) | INTRAMUSCULAR | Status: DC
  Administered 2018-04-22 – 2018-04-23 (×3): 60 mg via INTRAVENOUS
  Filled 2018-04-22 (×3): qty 2

## 2018-04-22 MED ORDER — SODIUM CHLORIDE 0.9 % IV SOLN: 250.0000 mL | INTRAVENOUS | Status: DC | PRN

## 2018-04-22 MED ORDER — SODIUM CHLORIDE 0.9 % IV SOLN
25.0000 mg | Freq: Three times a day (TID) | INTRAVENOUS | Status: DC
  Administered 2018-04-22 – 2018-04-24 (×5): 25 mg via INTRAVENOUS
  Filled 2018-04-22 (×6): qty 0.5

## 2018-04-22 MED ORDER — FAMOTIDINE IN NACL 20-0.9 MG/50ML-% IV SOLN
20.0000 mg | Freq: Once | INTRAVENOUS | Status: AC
  Administered 2018-04-22: 20 mg via INTRAVENOUS
  Filled 2018-04-22: qty 50

## 2018-04-22 MED ORDER — FAMOTIDINE IN NACL 20-0.9 MG/50ML-% IV SOLN
20.0000 mg | Freq: Two times a day (BID) | INTRAVENOUS | Status: DC
  Administered 2018-04-22 – 2018-04-23 (×3): 20 mg via INTRAVENOUS
  Filled 2018-04-22 (×3): qty 50

## 2018-04-22 MED ORDER — OXYMETAZOLINE HCL 0.05 % NA SOLN
NASAL | Status: AC
  Filled 2018-04-22: qty 15

## 2018-04-22 MED ORDER — ACETAMINOPHEN 650 MG RE SUPP: 650.0000 mg | Freq: Four times a day (QID) | RECTAL | Status: DC | PRN

## 2018-04-22 MED ORDER — SODIUM CHLORIDE 0.9% FLUSH: 3.0000 mL | INTRAVENOUS | Status: DC | PRN

## 2018-04-22 MED ORDER — HYDRALAZINE HCL 20 MG/ML IJ SOLN: 10.0000 mg | Freq: Four times a day (QID) | INTRAMUSCULAR | Status: DC | PRN

## 2018-04-22 MED ORDER — SODIUM CHLORIDE 0.9% FLUSH
3.0000 mL | Freq: Two times a day (BID) | INTRAVENOUS | Status: DC
  Administered 2018-04-22 – 2018-04-24 (×4): 3 mL via INTRAVENOUS

## 2018-04-22 MED ORDER — PNEUMOCOCCAL VAC POLYVALENT 25 MCG/0.5ML IJ INJ
0.5000 mL | INJECTION | INTRAMUSCULAR | Status: AC
  Administered 2018-04-24: 10:00:00 0.5 mL via INTRAMUSCULAR
  Filled 2018-04-22: qty 0.5

## 2018-04-22 MED ORDER — INSULIN ASPART 100 UNIT/ML ~~LOC~~ SOLN
0.0000 [IU] | Freq: Three times a day (TID) | SUBCUTANEOUS | Status: DC
  Administered 2018-04-23: 3 [IU] via SUBCUTANEOUS
  Administered 2018-04-23 – 2018-04-24 (×2): 7 [IU] via SUBCUTANEOUS
  Filled 2018-04-22 (×3): qty 1

## 2018-04-22 MED ORDER — FLEET ENEMA 7-19 GM/118ML RE ENEM: 1.0000 | ENEMA | Freq: Once | RECTAL | Status: DC | PRN

## 2018-04-22 NOTE — H&P (Signed)
[facility] [place] [facility]   PATIENT NAME: David Sloan    MR#:  [id number]  DATE OF BIRTH:  Apr 04, 1941  DATE OF ADMISSION:  04/22/2018  PRIMARY CARE PHYSICIAN: Gracelyn Nurse, MD   REQUESTING/REFERRING PHYSICIAN: Governor Rooks, MD  CHIEF COMPLAINT:   Chief Complaint  Patient presents with  . Dysphagia    HISTORY OF PRESENT ILLNESS: David Sloan  is a 77 y.o. male with a known history of essential hypertension, BPH, diabetes type 2, diabetic neuropathy, GERD previous stroke who is presenting to the hospital with complaint of swelling of his tongue and his throat that started yesterday.  Patient is not having any trouble with breathing.  Patient has been on Cozaar for a very long time.  There is a concern he is having angioedema. PAST MEDICAL HISTORY:   Past Medical History:  Diagnosis Date  . Atypical chest pain    a. 04/2013 MV: nl EF, no ischemia.  Marland Kitchen BPH (benign prostatic hyperplasia)   . Chronic back pain   . Diabetes mellitus without complication (HCC)   . Diabetic neuropathy (HCC)   . Essential hypertension   . Falls   . GERD (gastroesophageal reflux disease)   . Memory disorder   . OSA (obstructive sleep apnea)   . Osteoarthritis    a. s/p bilat knee surgeries.  . Sinus bradycardia   . Stroke [facility])    a. ~ 2002 - no residual deficits;  b. 03/2015 Head CT: prior old infarcts and foci of small vesel dzs in pons bilat. ? bilat occipital lobe acute infarcts R>L.  Marland Kitchen Urinary incontinence     PAST SURGICAL HISTORY:  Past Surgical History:  Procedure Laterality Date  . JOINT REPLACEMENT     bilateral knee replacements    SOCIAL HISTORY:  Social History   Tobacco Use  . Smoking status: Former Smoker    Packs/day: 1.00    Years: 25.00    Pack years: 25.00    Types: Cigarettes  . Smokeless tobacco: Never Used  Substance Use Topics  . Alcohol use: No    FAMILY HISTORY:  Family History  Problem Relation Age of Onset  . Diabetes  Mother   . Alzheimer's disease Father     DRUG ALLERGIES: No Known Allergies  REVIEW OF SYSTEMS:   CONSTITUTIONAL: No fever, fatigue or weakness.  EYES: No blurred or double vision.  EARS, NOSE, AND THROAT: No tinnitus or ear pain.  Throat swelling RESPIRATORY: No cough, shortness of breath, wheezing or hemoptysis.  CARDIOVASCULAR: No chest pain, orthopnea, edema.  GASTROINTESTINAL: No nausea, vomiting, diarrhea or abdominal pain.  GENITOURINARY: No dysuria, hematuria.  ENDOCRINE: No polyuria, nocturia,  HEMATOLOGY: No anemia, easy bruising or bleeding SKIN: No rash or lesion. MUSCULOSKELETAL: No joint pain or arthritis.   NEUROLOGIC: No tingling, numbness, weakness.  PSYCHIATRY: No anxiety or depression.   MEDICATIONS AT HOME:  Prior to Admission medications   Medication Sig Start Date End Date Taking? Authorizing Provider  amLODipine (NORVASC) 10 MG tablet Take 1 tablet (10 mg total) by mouth daily. Patient taking differently: Take 5 mg by mouth daily.  10/04/15   Alford Highland, MD  aspirin 81 MG chewable tablet Chew 1 tablet (81 mg total) by mouth daily. 10/04/15   Alford Highland, MD  atorvastatin (LIPITOR) 40 MG tablet Take 1 tablet (40 mg total) by mouth daily at 6 PM. 10/04/15   Alford Highland, MD  cholecalciferol (VITAMIN D) 1000 units tablet Take 1,000 Units by  mouth daily.    [provider]  insulin NPH Human (HUMULIN N) 100 UNIT/ML injection 32 units before breakfast and 24 units before supper. Patient taking differently: Inject 20 Units into the skin 2 (two) times daily.  10/04/15   Alford Highland, MD  losartan (COZAAR) 100 MG tablet Take 100 mg by mouth daily. 10/23/17   [provider]  oxyCODONE-acetaminophen (PERCOCET) 10-325 MG tablet Take 1 tablet by mouth 2 (two) times daily as needed for pain.    [provider]  oxyCODONE-acetaminophen (PERCOCET) 7.5-325 MG tablet Take 1 tablet by mouth every 6 (six) hours as needed for moderate  pain or severe pain. 04/03/18 05/03/18  Barbette Merino, NP  pantoprazole (PROTONIX) 40 MG tablet Take 40 mg by mouth daily.  01/15/15 02/15/18  [provider]  polyethylene glycol (MIRALAX / GLYCOLAX) packet Take 17 g by mouth daily.    [provider]  tamsulosin (FLOMAX) 0.4 MG CAPS capsule Take 0.4 mg by mouth daily.    [provider]      PHYSICAL EXAMINATION:   VITAL SIGNS: Blood pressure 107/66, pulse (!) 59, temperature 98.9 F (37.2 C), temperature source Oral, resp. rate 16, SpO2 100 %.  GENERAL:  77 y.o.-year-old patient lying in the bed with no acute distress.  EYES: Pupils equal, round, reactive to light and accommodation. No scleral icterus. Extraocular muscles intact.  HEENT: Head atraumatic, normocephalic. Oropharynx and nasopharynx clear.  Lip swelling  NECK:  Supple, no jugular venous distention. No thyroid enlargement, no tenderness.  LUNGS: Normal breath sounds bilaterally, no wheezing, rales,rhonchi or crepitation. No use of accessory muscles of respiration.  CARDIOVASCULAR: S1, S2 normal. No murmurs, rubs, or gallops.  ABDOMEN: Soft, nontender, nondistended. Bowel sounds present. No organomegaly or mass.  EXTREMITIES: No pedal edema, cyanosis, or clubbing.  NEUROLOGIC: Cranial nerves II through XII are intact. Muscle strength 5/5 in all extremities. Sensation intact. Gait not checked.  PSYCHIATRIC: The patient is alert and oriented x 3.  SKIN: No obvious rash, lesion, or ulcer.   LABORATORY PANEL:   CBC Recent Labs  Lab 04/22/18 1633  WBC 11.3*  HGB 12.5*  HCT 37.8*  PLT 220  MCV 91.7  MCH 30.3  MCHC 33.1  RDW 13.4  LYMPHSABS 1.7  MONOABS 1.3*  EOSABS 0.3  BASOSABS 0.0   ------------------------------------------------------------------------------------------------------------------  Chemistries  No results for input(s): NA, K, CL, CO2, GLUCOSE, BUN, CREATININE, CALCIUM, MG, AST, ALT, ALKPHOS, BILITOT in the last 168  hours.  Invalid input(s): GFRCGP ------------------------------------------------------------------------------------------------------------------ CrCl cannot be calculated (Patient's most recent lab result is older than the maximum 21 days allowed.). ------------------------------------------------------------------------------------------------------------------ No results for input(s): TSH, T4TOTAL, T3FREE, THYROIDAB in the last 72 hours.  Invalid input(s): FREET3   Coagulation profile No results for input(s): INR, PROTIME in the last 168 hours. ------------------------------------------------------------------------------------------------------------------- No results for input(s): DDIMER in the last 72 hours. -------------------------------------------------------------------------------------------------------------------  Cardiac Enzymes No results for input(s): CKMB, TROPONINI, MYOGLOBIN in the last 168 hours.  Invalid input(s): CK ------------------------------------------------------------------------------------------------------------------ Invalid input(s): POCBNP  ---------------------------------------------------------------------------------------------------------------  Urinalysis    Component Value Date/Time   COLORURINE YELLOW (A) 02/23/2018 1300   APPEARANCEUR CLEAR (A) 02/23/2018 1300   LABSPEC 1.024 02/23/2018 1300   PHURINE 5.0 02/23/2018 1300   GLUCOSEU NEGATIVE 02/23/2018 1300   HGBUR NEGATIVE 02/23/2018 1300   BILIRUBINUR NEGATIVE 02/23/2018 1300   KETONESUR NEGATIVE 02/23/2018 1300   PROTEINUR NEGATIVE 02/23/2018 1300   NITRITE NEGATIVE 02/23/2018 1300   LEUKOCYTESUR NEGATIVE 02/23/2018 1300     RADIOLOGY:  No results found.  EKG: Orders placed or performed during the hospital encounter of 02/15/18  . ED EKG  . ED EKG  . EKG 12-Lead  . EKG 12-Lead    IMPRESSION AND PLAN: Patient is 77 year old on Cozaar coming in with throat  swelling tongue swelling  1.  Acute angioedema due to Cozaar therapy Stop Cozaar Has been seen by ENT who has visualized his vocal cord We will place patient on IV Solu-Medrol IV Pepcid and Benadryl  2.  Essential hypertension I will hold all his medications due to likely difficulty with swallowing use IV hydralazine.  3.  Diabetes type 2 we will place on sliding scale insulin hold oral meds  4.  Hyperlipidemia we will hold all his oral medication  5.  BPH hold oral medications until swelling is decreased  All the records are reviewed and case discussed with ED provider. Management plans discussed with the patient, family and they are in agreement.  CODE STATUS: Code Status History    Date Active Date Inactive Code Status Order ID Comments User Context   10/03/2015 0156 10/04/2015 1728 Full Code [id number]  Ihor Austin, MD Inpatient   03/30/2015 1129 03/31/2015 2013 Full Code [id number]  [place] Siren, MD Inpatient       TOTAL TIME TAKING CARE OF THIS PATIENT:55 minutes.    Auburn Bilberry M.D on 04/22/2018 at 5:35 PM  Between 7am to 6pm - Pager - [phone]  After 6pm go to [email or web address] - password [email or web address]  [facility] Office  [phone]  CC: Primary care physician; Gracelyn Nurse, MD

## 2018-04-22 NOTE — ED Provider Notes (Signed)
[facility] Emergency Department Provider Note ____________________________________________   I have reviewed the triage vital signs and the triage nursing note.  HISTORY  Chief Complaint Dysphagia   Historian Patient and son  HPI David Sloan is a 77 y.o. male from [facility] home presents for trouble swallowing.  States that his tongue and lips are swollen.  States he noticed it yesterday.  Today his lips and tongue was more swollen.  He is having some trouble swallowing secretions.  No coughing.  No fever.  No abdominal pain.  No family history or personal history of angioedema.  No chest pain.  He does report some sore throat.  Symptoms are moderate to severe.     Past Medical History:  Diagnosis Date  . Atypical chest pain    a. 04/2013 MV: nl EF, no ischemia.  Marland Kitchen BPH (benign prostatic hyperplasia)   . Chronic back pain   . Diabetes mellitus without complication (HCC)   . Diabetic neuropathy (HCC)   . Essential hypertension   . Falls   . GERD (gastroesophageal reflux disease)   . Memory disorder   . OSA (obstructive sleep apnea)   . Osteoarthritis    a. s/p bilat knee surgeries.  . Sinus bradycardia   . Stroke [facility])    a. ~ 2002 - no residual deficits;  b. 03/2015 Head CT: prior old infarcts and foci of small vesel dzs in pons bilat. ? bilat occipital lobe acute infarcts R>L.  Marland Kitchen Urinary incontinence     Patient Active Problem List   Diagnosis Date Noted  . Angioedema 04/22/2018  . Long term current use of opiate analgesic 03/04/2018  . Nontraumatic intracerebral hemorrhage (HCC) 02/09/2018  . Chronic pain syndrome 09/23/2017  . Hyperlipidemia, mixed 06/24/2017  . Benign prostatic hyperplasia without lower urinary tract symptoms 06/24/2017  . History of CVA (cerebrovascular accident) 06/24/2017  . Primary open angle glaucoma (POAG) of both eyes 06/24/2017  . Controlled type 2 diabetes mellitus without complication, with long-term current  use of insulin (HCC) 06/24/2017  . 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 Pain of left femur 09/29/2016  . Pain medication agreement signed 08/05/2016  . Fall 10/03/2015  . Gait instability 10/03/2015  . TIA (transient ischemic attack) 10/03/2015  . Acute CVA (cerebrovascular accident) (HCC) 10/03/2015  . CVA (cerebral infarction) 10/03/2015  . Risk for falls 07/30/2015  . S/P cholecystectomy 07/30/2015  . Diabetic neuropathy (HCC) 07/17/2015  . Scoliosis 04/13/2015  . Fibrositis 04/13/2015  . Slurred speech 03/31/2015  . Elevated transaminase level 03/31/2015  . Gallstones 03/31/2015  . Lower extremity weakness 03/30/2015  . Cerebral infarction (HCC)   . Elevated troponin   . Uncontrolled type 2 diabetes mellitus with circulatory disorder (HCC)   . EKG abnormalities   . Emesis   . Gall bladder disease   . Other long term (current) drug therapy 02/13/2015  . Essential (primary) hypertension 09/18/2014  . Right sciatic nerve pain 09/15/2014  . Bradycardia 09/13/2014  . Cognitive disorder 04/05/2014  . Chronic pain associated with significant psychosocial dysfunction 01/17/2014  . Back ache 01/17/2014  . Esophageal stenosis 11/30/2013  . Can't get food down 11/30/2013  . Esophageal stricture 11/30/2013  . Chronic, continuous use of opioids 07/06/2013  . Cerebrovascular accident (CVA) (HCC) 05/19/2013  . Hypothyroid 05/19/2013  . Acid reflux 05/19/2013  . Stroke (HCC) 05/19/2013  . Gastroesophageal reflux disease without esophagitis 05/19/2013  . Spondylosis of lumbar region without myelopathy or radiculopathy 01/11/2013  .  SI (sacroiliac) pain 10/06/2012  . Osteoarthritis 10/06/2012  . Obstructive sleep apnea 10/06/2012  . Lumbar degenerative disc disease 10/06/2012  . Facet arthritis of lumbar region 10/06/2012  . Diabetes mellitus (HCC) 10/06/2012  . Diabetes (HCC) 10/06/2012  . Punctate keratitis 01/21/2012  . 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HCC) 01/21/2012  . Type 2  diabetes mellitus (HCC) 10/16/2010  . Tear film insufficiency 10/16/2010  . Senile corneal changes 10/16/2010  . Primary open-angle glaucoma 10/16/2010  . Moderate stage glaucoma 10/16/2010  . Background diabetic retinopathy (HCC) 10/16/2010  . 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 JOINT REPLACEMENT     bilateral knee replacements    Prior to Admission medications   Medication Sig Start Date End Date Taking? Authorizing Provider  amLODipine (NORVASC) 5 MG tablet Take 5 mg by mouth daily.   Yes [provider]  aspirin 81 MG chewable tablet Chew 1 tablet (81 mg total) by mouth daily. 10/04/15  Yes Wieting, Richard, MD  atorvastatin (LIPITOR) 40 MG tablet Take 40 mg by mouth daily.   Yes [provider]  cholecalciferol (VITAMIN D) 1000 units tablet Take 1,000 Units by mouth daily.   Yes [provider]  clopidogrel (PLAVIX) 75 MG tablet Take 75 mg by mouth daily.   Yes [provider]  insulin NPH Human (HUMULIN N) 100 UNIT/ML injection 32 units before breakfast and 24 units before supper. Patient taking differently: Inject 20 Units into the skin 2 (two) times daily.  10/04/15  Yes Wieting, Richard, MD  losartan (COZAAR) 100 MG tablet Take 100 mg by mouth daily. 10/23/17  Yes [provider]  oxyCODONE-acetaminophen (PERCOCET) 7.5-325 MG tablet Take 1 tablet by mouth every 6 (six) hours as needed for moderate pain or severe pain. 04/03/18 05/03/18 Yes King, Shana Chute, NP  pantoprazole (PROTONIX) 40 MG tablet Take 40 mg by mouth daily.  01/15/15 04/22/18 Yes [provider]  tamsulosin (FLOMAX) 0.4 MG CAPS capsule Take 0.4 mg by mouth daily.   Yes [provider]  amLODipine (NORVASC) 10 MG tablet Take 1 tablet (10 mg total) by mouth daily. Patient not taking: Reported on 04/22/2018 10/04/15   Alford Highland, MD    No Known  Allergies  Family History  Problem Relation Age of Onset  . Diabetes Mother   . Alzheimer's disease Father     Social History Social History   Tobacco Use  . Smoking status: Former Smoker    Packs/day: 1.00    Years: 25.00    Pack years: 25.00    Types: Cigarettes  . Smokeless tobacco: Never Used  Substance Use Topics  . Alcohol use: No  . Drug use: No    Review of Systems  Constitutional: Negative for fever. Eyes: Negative for visual changes. ENT: Positive for sore throat. Cardiovascular: Negative for chest pain. Respiratory: Negative for shortness of breath. Gastrointestinal: Negative for abdominal pain, vomiting and diarrhea. Genitourinary: Negative for dysuria. Musculoskeletal: Negative for back pain. Skin: Negative for rash. Neurological: Negative for headache.  ____________________________________________   PHYSICAL EXAM:  VITAL SIGNS: ED Triage Vitals  Enc Vitals Group     BP 04/22/18 1621 107/66     Pulse Rate 04/22/18 1619 (!) 59     Resp 04/22/18 1619 16     Temp 04/22/18 1619 98.9 F (37.2 C)     Temp Source 04/22/18 1619 Oral     SpO2 04/22/18 1621 100 %  Weight --      Height --      Head Circumference --      [facility] Flow --      Pain Score 04/22/18 1620 10     Pain Loc --      Pain Edu? --      Excl. in GC? --      Constitutional: Alert and oriented.  HEENT      Head: Normocephalic and atraumatic.      Eyes: Conjunctivae are normal. Pupils equal and round.       Ears:         Nose: No congestion/rhinnorhea.      Mouth/Throat: Mucous membranes are moist.  Swollen upper and lower lip.  Swollen tongue moderate to severe.  Unable to see the posterior oropharynx.      Neck: No stridor. Cardiovascular/Chest: Normal rate, regular rhythm.  No murmurs, rubs, or gallops. Respiratory: Normal respiratory effort without tachypnea nor retractions. Breath sounds are clear and equal bilaterally. No wheezes/rales/rhonchi. Gastrointestinal: Soft.  No distention, no guarding, no rebound. Nontender.    Genitourinary/rectal:Deferred Musculoskeletal: Nontender with normal range of motion in all extremities. No joint effusions.  No lower extremity tenderness.  No edema. Neurologic: No facial droop.  Normal speech and language. No gross or focal neurologic deficits are appreciated. Skin:  Skin is warm, dry and intact. No rash noted. Psychiatric: Mood and affect are normal. Speech and behavior are normal. Patient exhibits appropriate insight and judgment.   ____________________________________________  LABS (pertinent positives/negatives) I, Governor Rooks, MD the attending physician have reviewed the labs noted below.  Labs Reviewed  CBC WITH DIFFERENTIAL/PLATELET - Abnormal; Notable for the following components:      Result Value   WBC 11.3 (*)    RBC 4.12 (*)    Hemoglobin 12.5 (*)    HCT 37.8 (*)    Neutro Abs 7.9 (*)    Monocytes Absolute 1.3 (*)    All other components within normal limits  BASIC METABOLIC PANEL    ____________________________________________    EKG I, Governor Rooks, MD, the attending physician have personally viewed and interpreted all ECGs.  None ____________________________________________  RADIOLOGY   None __________________________________________  PROCEDURES  Procedure(s) performed: None  Procedures  Critical Care performed: None   ____________________________________________  ED COURSE / ASSESSMENT AND PLAN  Pertinent labs & imaging results that were available during my care of the patient were reviewed by me and considered in my medical decision making (see chart for details).     Patient is having angioedema, uncertain etiology although he is on losartan as well as amlodipine.  Called ENT given patient is having swelling sensation at the throat and is spitting up some secretions although it seems like he is swallowing most of his secretions.  Patient will need hospitalization.   Initiated IV steroid, Medrol as well as H1 Benadryl and H2 blocker.    CONSULTATIONS: Dr. Andee Poles, ENT to come to the ED to scope for evaluation.  Hospitalist for admission.   Patient / Family / Caregiver informed of clinical course, medical decision-making process, and agree with plan.    ___________________________________________   FINAL CLINICAL IMPRESSION(S) / ED DIAGNOSES   Final diagnoses:  Angioedema, initial encounter      ___________________________________________         Note: This dictation was prepared with Dragon dictation. Any transcriptional errors that result from this process are unintentional    Governor Rooks, MD 04/22/18 1811

## 2018-04-22 NOTE — Consult Note (Signed)
.. Avis, Mcmahill [id number] January 02, 1941 [place] Bilberry, MD  Reason for Consult: angioedema  HPI: 77 y.o. Male with history of CVA presents with several day history of throat and tongue swelling.  Patient is poor historian but son reports had issues yesterday with tongue and lip swelling and pain.  This has persisted through today.  He denies any worsening but reports it has not improved significantly.  Patient was brought to ED for evaluation.  Patient is taking Lostartan currently at [facility].  Allergies: No Known Allergies  ROS: Review of systems normal other than 12 systems except per HPI.  PMH:  Past Medical History:  Diagnosis Date  . Atypical chest pain    a. 04/2013 MV: nl EF, no ischemia.  Marland Kitchen BPH (benign prostatic hyperplasia)   . Chronic back pain   . Diabetes mellitus without complication (HCC)   . Diabetic neuropathy (HCC)   . Essential hypertension   . Falls   . GERD (gastroesophageal reflux disease)   . Memory disorder   . OSA (obstructive sleep apnea)   . Osteoarthritis    a. s/p bilat knee surgeries.  . Sinus bradycardia   . Stroke [facility])    a. ~ 2002 - no residual deficits;  b. 03/2015 Head CT: prior old infarcts and foci of small vesel dzs in pons bilat. ? bilat occipital lobe acute infarcts R>L.  Marland Kitchen Urinary incontinence     FH:  Family History  Problem Relation Age of Onset  . Diabetes Mother   . Alzheimer's disease Father     SH:  Social History   Socioeconomic History  . Marital status: Married    Spouse name: Harriett Sine  . Number of children: 3  . Years of education: Not on file  . Highest education level: Not on file  Occupational History  . Occupation: retired  Engineer, production  . Financial resource strain: Not on file  . Food insecurity:    Worry: Not on file    Inability: Not on file  . Transportation needs:    Medical: Not on file    Non-medical: Not on file  Tobacco Use  . Smoking status: Former Smoker    Packs/day: 1.00    Years:  25.00    Pack years: 25.00    Types: Cigarettes  . Smokeless tobacco: Never Used  Substance and Sexual Activity  . Alcohol use: No  . Drug use: No  . Sexual activity: Not on file  Lifestyle  . Physical activity:    Days per week: Not on file    Minutes per session: Not on file  . Stress: Not on file  Relationships  . Social connections:    Talks on phone: Not on file    Gets together: Not on file    Attends religious service: Not on file    Active member of club or organization: Not on file    Attends meetings of clubs or organizations: Not on file    Relationship status: Not on file  . Intimate partner violence:    Fear of current or ex partner: Not on file    Emotionally abused: Not on file    Physically abused: Not on file    Forced sexual activity: Not on file  Other Topics Concern  . Not on file  Social History Narrative  . Not on file    PSH:  Past Surgical History:  Procedure Laterality Date  . JOINT REPLACEMENT     bilateral knee replacements  Physical  Exam:  GEN-  Elderly man sitting in bed in no acute distress,  Slightly "wet" quality to his voice EARS-  Clear external ears NOSE-  Clear bilaterally OC/OP-  Moderate edema of upper and lower lip, moderate tongue edema.  Visualized posterior oropharynx and soft palat and uvula.  Mild elongation of uvula. Floor of mouth full but soft NECK-  Supple with no significant LAD or massesCN 2-12 grossly intact and symmetric. RESP- unlabored with no use of accessory muscles, no distress when falling asleep CARD-  irregular  Procedure:  Trans-nasal flexible laryngoscopy-  After verbal consent was obtained from patient and son, the patient's bilateral nasal cavities were sprayed with topical Afrin.  A flexible laryngoscope was inserted into the patient's left nasal cavity and advanced for evaluation of the patient's nasal cavity, nasopharynx, oropharynx, pharynx, and larynx.  This demonstrated retained secretions and  mild edema of arytenoids but normal appearing epiglottis, normal valleculae, normal true vocal folds.  A/P: Angioedema with tongue swelling and mild supraglottic edema of arytenoids only and pooling of secretions but patent airway.  Given time course of no worsening over past 48 hours but also no improvement, agree with admission and continuous monitoring.  Discussed with son and daughter over phone of exam and swelling but patient's airway is patent and no respiratory issues currently even when sleeping.  Recommend steroids/h2 blocker/benadryl as already given and will continue to follow.  If patient develops airway symptoms, may require intubation.   Haru Anspaugh 04/22/2018 6:09 PM

## 2018-04-22 NOTE — Progress Notes (Signed)
Advanced care plan.  Purpose of the Encounter: CODE STATUS  Parties in Attendance: Patient himself  Patient's Decision Capacity: Intact  Subjective/Patient's story:  Patient 77 year old with history of hypertension,, diabetes type 2 and GERD presenting to the hospital with angioedema  Objective/Medical story I asked patient regarding his desires for cardiac and pulmonary resuscitation He states that he would like everything done  Goals of care determination: Full code    CODE STATUS: Full code   Time spent discussing advanced care planning: 16 minutes

## 2018-04-22 NOTE — ED Triage Notes (Signed)
Pt to ED from [facility] with c/o mouth and throat swelling, uable to swallow. + drooling per family x 2 days. PT has + voice change. VSS, denies any aspiration or allergy. Denies any respiratory complaints. VSS

## 2018-04-23 LAB — BASIC METABOLIC PANEL
ANION GAP: 8 (ref 5–15)
BUN: 19 mg/dL (ref 8–23)
CALCIUM: 9.1 mg/dL (ref 8.9–10.3)
CO2: 26 mmol/L (ref 22–32)
Chloride: 106 mmol/L (ref 98–111)
Creatinine, Ser: 0.93 mg/dL (ref 0.61–1.24)
GFR calc Af Amer: >60 mL/min (ref >60)
Glucose, Bld: 160 mg/dL — ABNORMAL HIGH (ref 70–99)
POTASSIUM: 4.3 mmol/L (ref 3.5–5.1)
Sodium: 140 mmol/L (ref 135–145)

## 2018-04-23 LAB — GLUCOSE, CAPILLARY
GLUCOSE-CAPILLARY: 156 mg/dL — AB (ref 70–99)
GLUCOSE-CAPILLARY: 345 mg/dL — AB (ref 70–99)
Glucose-Capillary: 132 mg/dL — ABNORMAL HIGH (ref 70–99)
Glucose-Capillary: 144 mg/dL — ABNORMAL HIGH (ref 70–99)
Glucose-Capillary: 249 mg/dL — ABNORMAL HIGH (ref 70–99)
Glucose-Capillary: 340 mg/dL — ABNORMAL HIGH (ref 70–99)

## 2018-04-23 LAB — CBC
HEMATOCRIT: 38.3 % — AB (ref 39.0–52.0)
Hemoglobin: 12.7 g/dL — ABNORMAL LOW (ref 13.0–17.0)
MCH: 30 pg (ref 26.0–34.0)
MCHC: 33.2 g/dL (ref 30.0–36.0)
MCV: 90.3 fL (ref 80.0–100.0)
NRBC: 0 % (ref 0.0–0.2)
PLATELETS: 223 10*3/uL (ref 150–400)
RBC: 4.24 MIL/uL (ref 4.22–5.81)
RDW: 13.4 % (ref 11.5–15.5)
WBC: 11.9 10*3/uL — AB (ref 4.0–10.5)

## 2018-04-23 LAB — MRSA PCR SCREENING: MRSA BY PCR: NEGATIVE

## 2018-04-23 MED ORDER — LIDOCAINE VISCOUS HCL 2 % MT SOLN
15.0000 mL | OROMUCOSAL | Status: DC | PRN
  Administered 2018-04-23: 17:00:00 15 mL via OROMUCOSAL
  Filled 2018-04-23 (×5): qty 15

## 2018-04-23 MED ORDER — PHENOL 1.4 % MT LIQD
1.0000 | OROMUCOSAL | Status: DC | PRN
  Administered 2018-04-24: 1 via OROMUCOSAL
  Filled 2018-04-23: qty 177

## 2018-04-23 MED ORDER — TAMSULOSIN HCL 0.4 MG PO CAPS: 0.4000 mg | ORAL_CAPSULE | Freq: Every day | ORAL | Status: DC

## 2018-04-23 MED ORDER — ATORVASTATIN CALCIUM 20 MG PO TABS
40.0000 mg | ORAL_TABLET | Freq: Every day | ORAL | Status: DC
  Administered 2018-04-23: 40 mg via ORAL
  Filled 2018-04-23: qty 2

## 2018-04-23 MED ORDER — GUAIFENESIN-DM 100-10 MG/5ML PO SYRP
5.0000 mL | ORAL_SOLUTION | ORAL | Status: DC | PRN
  Administered 2018-04-23 (×2): 5 mL via ORAL
  Filled 2018-04-23 (×3): qty 5

## 2018-04-23 MED ORDER — METHYLPREDNISOLONE SODIUM SUCC 125 MG IJ SOLR
60.0000 mg | Freq: Three times a day (TID) | INTRAMUSCULAR | Status: DC
  Administered 2018-04-23 – 2018-04-24 (×3): 60 mg via INTRAVENOUS
  Filled 2018-04-23 (×3): qty 2

## 2018-04-23 MED ORDER — CLOPIDOGREL BISULFATE 75 MG PO TABS
75.0000 mg | ORAL_TABLET | Freq: Every day | ORAL | Status: DC
  Administered 2018-04-23 – 2018-04-24 (×2): 75 mg via ORAL
  Filled 2018-04-23 (×2): qty 1

## 2018-04-23 MED ORDER — AMLODIPINE BESYLATE 5 MG PO TABS
5.0000 mg | ORAL_TABLET | Freq: Every day | ORAL | Status: DC
  Administered 2018-04-23 – 2018-04-24 (×2): 5 mg via ORAL
  Filled 2018-04-23 (×2): qty 1

## 2018-04-23 NOTE — Plan of Care (Signed)
Pt. Has remained stable tonight. No new issues noted.  Continues with c/o throat & tongue being sore.  Will continue to monitor.

## 2018-04-23 NOTE — Progress Notes (Addendum)
[facility] - [place] at [facility]   PATIENT NAME: David Sloan    MR#:  [id number]  DATE OF BIRTH:  21-Nov-1940  SUBJECTIVE:  CHIEF COMPLAINT:   Chief Complaint  Patient presents with  . Dysphagia  seems jittery, reports some improvement but not good enough to go home, requests food, c/o tongue pain REVIEW OF SYSTEMS:  Review of Systems  Constitutional: Negative for chills, fever and weight loss.  HENT: Negative for nosebleeds and sore throat.   Eyes: Negative for blurred vision.  Respiratory: Negative for cough, shortness of breath and wheezing.   Cardiovascular: Negative for chest pain, orthopnea, leg swelling and PND.  Gastrointestinal: Negative for abdominal pain, constipation, diarrhea, heartburn, nausea and vomiting.  Genitourinary: Negative for dysuria and urgency.  Musculoskeletal: Negative for back pain.  Skin: Negative for rash.  Neurological: Negative for dizziness, speech change, focal weakness and headaches.  Endo/Heme/Allergies: Does not bruise/bleed easily.  Psychiatric/Behavioral: Negative for depression.   DRUG ALLERGIES:   Allergies  Allergen Reactions  . Losartan Potassium Swelling   VITALS:  Blood pressure (!) 147/71, pulse 79, temperature 98.1 F (36.7 C), temperature source Oral, resp. rate 16, height 5\' 5"  (1.651 m), weight 82.9 kg, SpO2 97 %. PHYSICAL EXAMINATION:  Physical Exam  Constitutional: He is oriented to person, place, and time.  HENT:  Head: Normocephalic and atraumatic.  Improved edema of upper and lower lips with reduced tension, tenderness to tongue and improved edema of oral tongue  Eyes: Pupils are equal, round, and reactive to light. Conjunctivae and EOM are normal.  Neck: Normal range of motion. Neck supple. No tracheal deviation present. No thyromegaly present.  Cardiovascular: Normal rate, regular rhythm and normal heart sounds.  Pulmonary/Chest: Effort normal and breath sounds normal. No respiratory  distress. He has no wheezes. He exhibits no tenderness.  Abdominal: Soft. Bowel sounds are normal. He exhibits no distension. There is no tenderness.  Musculoskeletal: Normal range of motion.  Neurological: He is alert and oriented to person, place, and time. No cranial nerve deficit.  Skin: Skin is warm and dry. No rash noted.   LABORATORY PANEL:  Male CBC Recent Labs  Lab 04/23/18 0458  WBC 11.9*  HGB 12.7*  HCT 38.3*  PLT 223   ------------------------------------------------------------------------------------------------------------------ Chemistries  Recent Labs  Lab 04/23/18 0458  NA 140  K 4.3  CL 106  CO2 26  GLUCOSE 160*  BUN 19  CREATININE 0.93  CALCIUM 9.1   RADIOLOGY:  No results found. ASSESSMENT AND PLAN:  Patient is 77 year old on Cozaar coming in with throat swelling tongue swelling  1.  Acute angioedema due to Cozaar therapy Stopped Cozaar - improving on IV Solu-Medrol (can taper) IV Pepcid and Benadryl  2.  Essential hypertension: stable  3.  Diabetes type 2 - on sliding scale insulin hold oral meds  4.  Hyperlipidemia - hold all his oral medication  5.  BPH hold oral medications until swelling is decreased   Patient is from [facility] (long term - is considered his home)so will likely go back   All the records are reviewed and case discussed with Care Management/Social Worker. Management plans discussed with the patient, nursing and they are in agreement.  CODE STATUS: Full Code  TOTAL TIME TAKING CARE OF THIS PATIENT: 35 minutes.   More than 50% of the time was spent in counseling/coordination of care: YES  POSSIBLE D/C IN 1 DAYS, DEPENDING ON CLINICAL CONDITION.   Delfino Lovett M.D on 04/23/2018 at  9:34 AM  Between 7am to 6pm - Pager - [phone]  After 6pm go to [email or web address] - Social research officer, government  [facility] [place] [facility]  Office  [phone]  CC: Primary care physician; Gracelyn Nurse, MD  Note:  This dictation was prepared with Dragon dictation along with smaller phrase technology. Any transcriptional errors that result from this process are unintentional.

## 2018-04-23 NOTE — Clinical Social Work Note (Signed)
Clinical Social Work Assessment  Patient Details  Name: David Sloan MRN: [id number] Date of Birth: 1940-08-25  Date of referral:  04/23/18               Reason for consult:  Facility Placement                Permission sought to share information with:  Case Manager, Customer service manager, Family Supports Permission granted to share information::  Yes, Verbal Permission Granted  Name::        Agency::     Relationship::     Contact Information:     Housing/Transportation Living arrangements for the past 2 months:  [place] of Information:  Patient Patient Interpreter Needed:  None Criminal Activity/Legal Involvement Pertinent to Current Situation/Hospitalization:  No - Comment as needed Significant Relationships:  Adult Children Lives with:  Facility Resident Do you feel safe going back to the place where you live?  Yes Need for family participation in patient care:  Yes (Comment)  Care giving concerns:  Patient is from [facility]    Social Worker assessment / plan:  CSW found through chart review that patient is from [facility]. CSW met with patient to discuss discharge plan. CSW introduced self and explained role. Patient states that he is from [facility] and has been there about 2 months. Patient reports he went to [facility] after a stroke. Patient is alert and oriented x4. He has support from his adult children but makes his own decisions. CSW also spoke with Otila Kluver at [facility] and she confirmed that patient is a long term resident there and can return when medically stable. CSW will follow for discharge planning.   Employment status:  Retired Nurse, adult PT Recommendations:  Not assessed at this time Information / Referral to community resources:     Patient/Family's Response to care:  Patient thanked CSW for assistance   Patient/Family's Understanding of and Emotional Response to Diagnosis, Current Treatment, and  Prognosis:  Patient understands current treatment and is accepting of plan   Emotional Assessment Appearance:  Appears stated age Attitude/Demeanor/Rapport:    Affect (typically observed):  Accepting, Stable, Hopeful Orientation:  Oriented to Self, Oriented to Place, Oriented to  Time, Oriented to Situation Alcohol / Substance use:  Not Applicable Psych involvement (Current and /or in the community):  No (Comment)  Discharge Needs  Concerns to be addressed:  Discharge Planning Concerns Readmission within the last 30 days:  No Current discharge risk:  None Barriers to Discharge:  Continued Medical Work up   [employer], [place] 04/23/2018, 10:07 AM

## 2018-04-23 NOTE — Final Consult Note (Addendum)
.. 04/23/2018 7:56 AM  David Sloan [id number]  Hospital Day 2    Temp:  [98.9 F (37.2 C)-99.7 F (37.6 C)] 99.7 F (37.6 C) (11/07 1847) Pulse Rate:  [56-66] 56 (11/07 1847) Resp:  [16] 16 (11/07 1847) BP: (107-186)/(59-70) 171/59 (11/07 1847) SpO2:  [100 %] 100 % (11/07 1847) Weight:  [82.9 kg] 82.9 kg (11/07 1847),     Intake/Output Summary (Last 24 hours) at 04/23/2018 0756 Last data filed at 04/23/2018 0600 Gross per 24 hour  Intake 150 ml  Output 200 ml  Net -50 ml    Results for orders placed or performed during the hospital encounter of 04/22/18 (from the past 24 hour(s))  Basic metabolic panel     Status: None   Collection Time: 04/22/18  4:33 PM  Result Value Ref Range   Sodium 140 135 - 145 mmol/L   Potassium 4.1 3.5 - 5.1 mmol/L   Chloride 103 98 - 111 mmol/L   CO2 29 22 - 32 mmol/L   Glucose, Bld 73 70 - 99 mg/dL   BUN 16 8 - 23 mg/dL   Creatinine, Ser 6.57 0.61 - 1.24 mg/dL   Calcium 9.5 8.9 - 84.6 mg/dL   GFR calc non Af Amer >60 >60 mL/min   GFR calc Af Amer >60 >60 mL/min   Anion gap 8 5 - 15  CBC with Differential     Status: Abnormal   Collection Time: 04/22/18  4:33 PM  Result Value Ref Range   WBC 11.3 (H) 4.0 - 10.5 K/uL   RBC 4.12 (L) 4.22 - 5.81 MIL/uL   Hemoglobin 12.5 (L) 13.0 - 17.0 g/dL   HCT 96.2 (L) 95.2 - 84.1 %   MCV 91.7 80.0 - 100.0 fL   MCH 30.3 26.0 - 34.0 pg   MCHC 33.1 30.0 - 36.0 g/dL   RDW 32.4 40.1 - 02.7 %   Platelets 220 150 - 400 K/uL   nRBC 0.0 0.0 - 0.2 %   Neutrophils Relative % 69 %   Neutro Abs 7.9 (H) 1.7 - 7.7 K/uL   Lymphocytes Relative 15 %   Lymphs Abs 1.7 0.7 - 4.0 K/uL   Monocytes Relative 12 %   Monocytes Absolute 1.3 (H) 0.1 - 1.0 K/uL   Eosinophils Relative 3 %   Eosinophils Absolute 0.3 0.0 - 0.5 K/uL   Basophils Relative 0 %   Basophils Absolute 0.0 0.0 - 0.1 K/uL   Immature Granulocytes 1 %   Abs Immature Granulocytes 0.07 0.00 - 0.07 K/uL  Glucose, capillary     Status: Abnormal   Collection Time: 04/23/18  1:06 AM  Result Value Ref Range   Glucose-Capillary 132 (H) 70 - 99 mg/dL  CBC     Status: Abnormal   Collection Time: 04/23/18  4:58 AM  Result Value Ref Range   WBC 11.9 (H) 4.0 - 10.5 K/uL   RBC 4.24 4.22 - 5.81 MIL/uL   Hemoglobin 12.7 (L) 13.0 - 17.0 g/dL   HCT 25.3 (L) 66.4 - 40.3 %   MCV 90.3 80.0 - 100.0 fL   MCH 30.0 26.0 - 34.0 pg   MCHC 33.2 30.0 - 36.0 g/dL   RDW 47.4 25.9 - 56.3 %   Platelets 223 150 - 400 K/uL   nRBC 0.0 0.0 - 0.2 %  Basic metabolic panel     Status: Abnormal   Collection Time: 04/23/18  4:58 AM  Result Value Ref Range   Sodium 140 135 - 145  mmol/L   Potassium 4.3 3.5 - 5.1 mmol/L   Chloride 106 98 - 111 mmol/L   CO2 26 22 - 32 mmol/L   Glucose, Bld 160 (H) 70 - 99 mg/dL   BUN 19 8 - 23 mg/dL   Creatinine, Ser 1.61 0.61 - 1.24 mg/dL   Calcium 9.1 8.9 - 09.6 mg/dL   GFR calc non Af Amer >60 >60 mL/min   GFR calc Af Amer >60 >60 mL/min   Anion gap 8 5 - 15  MRSA PCR Screening     Status: None   Collection Time: 04/23/18  5:43 AM  Result Value Ref Range   MRSA by PCR NEGATIVE NEGATIVE  Glucose, capillary     Status: Abnormal   Collection Time: 04/23/18  6:47 AM  Result Value Ref Range   Glucose-Capillary 156 (H) 70 - 99 mg/dL  Glucose, capillary     Status: Abnormal   Collection Time: 04/23/18  7:26 AM  Result Value Ref Range   Glucose-Capillary 144 (H) 70 - 99 mg/dL    SUBJECTIVE:  Reports some improvement.  Has been NPO overnight.  Wet cough last night but improved this a.m.  OBJECTIVE:  GEN-  NAD, sitting in bed, improved voice with decreased "wet" quality this morning OC/OP-  Improved edema of upper and lower lips with reduced tension, tenderness to tongue but soft floor of mouth and improved edema of oral tongue  IMPRESSION:  Angioedema, slowly improving  PLAN:  Suspect Cozaar as cause.  OK to clear for diet per ENT perspective.  Once definite trend of lip edema continues and able to tolerate PO, cleared  for d/c per ENT perspective.  Chelan Heringer 04/23/2018, 7:56 AM

## 2018-04-23 NOTE — Progress Notes (Signed)
Pt resting in bed with no acute distress. Continues to have edema of lips, but has improved since am. Taking liquids with no difficulty swallowing noted. Unable to tolerate warm food due to soreness of mouth and tongue.

## 2018-04-23 NOTE — [place] FL2 (Signed)
[place] MEDICAID FL2 LEVEL OF CARE SCREENING TOOL     IDENTIFICATION  Patient Name: David Sloan Birthdate: 02-21-1941 Sex: male Admission Date (Current Location): 04/22/2018  [place] and [place] Number:  Chiropodist and Address:  [facility], [address] [place], [place] [address]      Provider Number: [id number]  Attending Physician Name and Address:  Delfino Lovett, MD  Relative Name and Phone Number:  Chapman Moss- daughter [phone]    Current Level of Care: Hospital Recommended Level of Care: Skilled Nursing Facility Prior Approval Number:    Date Approved/Denied:   PASRR Number:    Discharge Plan: SNF    Current Diagnoses: Patient Active Problem List   Diagnosis Date Noted  . Angioedema 04/22/2018  . Long term current use of opiate analgesic 03/04/2018  . Nontraumatic intracerebral hemorrhage (HCC) 02/09/2018  . Chronic pain syndrome 09/23/2017  . Hyperlipidemia, mixed 06/24/2017  . Benign prostatic hyperplasia without lower urinary tract symptoms 06/24/2017  . History of CVA (cerebrovascular accident) 06/24/2017  . Primary open angle glaucoma (POAG) of both eyes 06/24/2017  . Controlled type 2 diabetes mellitus without complication, with long-term current use of insulin (HCC) 06/24/2017  . 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 Pain of left femur 09/29/2016  . Pain medication agreement signed 08/05/2016  . Fall 10/03/2015  . Gait instability 10/03/2015  . TIA (transient ischemic attack) 10/03/2015  . Acute CVA (cerebrovascular accident) (HCC) 10/03/2015  . CVA (cerebral infarction) 10/03/2015  . Risk for falls 07/30/2015  . S/P cholecystectomy 07/30/2015  . Diabetic neuropathy (HCC) 07/17/2015  . Scoliosis 04/13/2015  . Fibrositis 04/13/2015  . Slurred speech 03/31/2015  . Elevated transaminase level 03/31/2015  . Gallstones 03/31/2015  . Lower extremity weakness 03/30/2015  . Cerebral infarction  (HCC)   . Elevated troponin   . Uncontrolled type 2 diabetes mellitus with circulatory disorder (HCC)   . EKG abnormalities   . Emesis   . Gall bladder disease   . Other long term (current) drug therapy 02/13/2015  . Essential (primary) hypertension 09/18/2014  . Right sciatic nerve pain 09/15/2014  . Bradycardia 09/13/2014  . Cognitive disorder 04/05/2014  . Chronic pain associated with significant psychosocial dysfunction 01/17/2014  . Back ache 01/17/2014  . Esophageal stenosis 11/30/2013  . Can't get food down 11/30/2013  . Esophageal stricture 11/30/2013  . Chronic, continuous use of opioids 07/06/2013  . Cerebrovascular accident (CVA) (HCC) 05/19/2013  . Hypothyroid 05/19/2013  . Acid reflux 05/19/2013  . Stroke (HCC) 05/19/2013  . Gastroesophageal reflux disease without esophagitis 05/19/2013  . Spondylosis of lumbar region without myelopathy or radiculopathy 01/11/2013  . SI (sacroiliac) pain 10/06/2012  . Osteoarthritis 10/06/2012  . Obstructive sleep apnea 10/06/2012  . Lumbar degenerative disc disease 10/06/2012  . Facet arthritis of lumbar region 10/06/2012  . Diabetes mellitus (HCC) 10/06/2012  . Diabetes (HCC) 10/06/2012  . Punctate keratitis 01/21/2012  . 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HCC) 01/21/2012  . Type 2 diabetes mellitus (HCC) 10/16/2010  . Tear film insufficiency 10/16/2010  . Senile corneal changes 10/16/2010  . Primary open-angle glaucoma 10/16/2010  . Moderate stage glaucoma 10/16/2010  . Background diabetic retinopathy (HCC) 10/16/2010  . 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) 10/16/2010    Orientation RESPIRATION BLADDER Height & Weight     Self, Time, Situation, Place  Normal Incontinent Weight: 182 lb 12.2 oz (82.9 kg) Height:  5\' 5"  (165.1 cm)  BEHAVIORAL SYMPTOMS/MOOD NEUROLOGICAL BOWEL NUTRITION STATUS  (  none) (none) Continent Diet(Heart Healthy/Carb modified )  AMBULATORY STATUS COMMUNICATION OF NEEDS Skin   Extensive  Assist Verbally Normal                       Personal Care Assistance Level of Assistance  Bathing, Feeding, Dressing Bathing Assistance: Limited assistance Feeding assistance: Independent Dressing Assistance: Limited assistance     Functional Limitations Info  Sight, Hearing, Speech Sight Info: Adequate Hearing Info: Adequate Speech Info: Adequate    SPECIAL CARE FACTORS FREQUENCY  PT (By licensed PT), OT (By licensed OT)                    Contractures Contractures Info: Not present    Additional Factors Info  Code Status, Allergies Code Status Info: Full Code  Allergies Info:  Losartan Potassium, Tamsulosin Hcl           Current Medications (04/23/2018):  This is the current hospital active medication list Current Facility-Administered Medications  Medication Dose Route Frequency Provider Last Rate Last Dose  . 0.9 %  sodium chloride infusion  250 mL Intravenous PRN Auburn Bilberry, MD      . acetaminophen (TYLENOL) tablet 650 mg  650 mg Oral Q6H PRN Auburn Bilberry, MD   650 mg at 04/22/18 1900   Or  . acetaminophen (TYLENOL) suppository 650 mg  650 mg Rectal Q6H PRN Auburn Bilberry, MD      . amLODipine (NORVASC) tablet 5 mg  5 mg Oral Daily Sherryll Burger, Vipul, MD      . atorvastatin (LIPITOR) tablet 40 mg  40 mg Oral q1800 Delfino Lovett, MD      . clopidogrel (PLAVIX) tablet 75 mg  75 mg Oral Daily Sherryll Burger, Vipul, MD      . diphenhydrAMINE (BENADRYL) 25 mg in sodium chloride 0.9 % 50 mL IVPB  25 mg Intravenous Q8H Auburn Bilberry, MD   Stopped at 04/23/18 6213  . enoxaparin (LOVENOX) injection 40 mg  40 mg Subcutaneous Q24H Auburn Bilberry, MD   40 mg at 04/22/18 2017  . famotidine (PEPCID) IVPB 20 mg premix  20 mg Intravenous Q12H Auburn Bilberry, MD   Stopped at 04/22/18 2311  . hydrALAZINE (APRESOLINE) injection 10 mg  10 mg Intravenous Q6H PRN Auburn Bilberry, MD      . insulin aspart (novoLOG) injection 0-9 Units  0-9 Units Subcutaneous TID WC Auburn Bilberry,  MD      . methylPREDNISolone sodium succinate (SOLU-MEDROL) 125 mg/2 mL injection 60 mg  60 mg Intravenous Q8H Shah, Vipul, MD      . ondansetron (ZOFRAN) tablet 4 mg  4 mg Oral Q6H PRN Auburn Bilberry, MD       Or  . ondansetron (ZOFRAN) injection 4 mg  4 mg Intravenous Q6H PRN Auburn Bilberry, MD      . pneumococcal 23 valent vaccine (PNU-IMMUNE) injection 0.5 mL  0.5 mL Intramuscular Tomorrow-1000 Auburn Bilberry, MD      . sodium chloride flush (NS) 0.9 % injection 3 mL  3 mL Intravenous Q12H Auburn Bilberry, MD   3 mL at 04/22/18 2019  . sodium chloride flush (NS) 0.9 % injection 3 mL  3 mL Intravenous PRN Auburn Bilberry, MD      . sodium phosphate (FLEET) 7-19 GM/118ML enema 1 enema  1 enema Rectal Once PRN Auburn Bilberry, MD         Discharge Medications: Please see discharge summary for a list of discharge medications.  Relevant Imaging Results:  Relevant Lab Results:   Additional Information    Gabrielle Mester  Louretta Shorten, LCSWA

## 2018-04-24 LAB — BASIC METABOLIC PANEL
Anion gap: 5 (ref 5–15)
BUN: 29 mg/dL — ABNORMAL HIGH (ref 8–23)
CALCIUM: 8.8 mg/dL — AB (ref 8.9–10.3)
CHLORIDE: 104 mmol/L (ref 98–111)
CO2: 26 mmol/L (ref 22–32)
CREATININE: 0.88 mg/dL (ref 0.61–1.24)
Glucose, Bld: 292 mg/dL — ABNORMAL HIGH (ref 70–99)
Potassium: 4.4 mmol/L (ref 3.5–5.1)
SODIUM: 135 mmol/L (ref 135–145)

## 2018-04-24 LAB — CBC
HEMATOCRIT: 33.5 % — AB (ref 39.0–52.0)
HEMOGLOBIN: 11.2 g/dL — AB (ref 13.0–17.0)
MCH: 29.9 pg (ref 26.0–34.0)
MCHC: 33.4 g/dL (ref 30.0–36.0)
MCV: 89.3 fL (ref 80.0–100.0)
NRBC: 0 % (ref 0.0–0.2)
Platelets: 216 10*3/uL (ref 150–400)
RBC: 3.75 MIL/uL — ABNORMAL LOW (ref 4.22–5.81)
RDW: 13.2 % (ref 11.5–15.5)
WBC: 15.1 10*3/uL — AB (ref 4.0–10.5)

## 2018-04-24 LAB — GLUCOSE, CAPILLARY: Glucose-Capillary: 313 mg/dL — ABNORMAL HIGH (ref 70–99)

## 2018-04-24 MED ORDER — FAMOTIDINE 20 MG PO TABS: 20.0000 mg | ORAL_TABLET | Freq: Two times a day (BID) | ORAL | 0 refills | Status: DC

## 2018-04-24 MED ORDER — FAMOTIDINE 20 MG PO TABS
20.0000 mg | ORAL_TABLET | Freq: Two times a day (BID) | ORAL | Status: DC
  Administered 2018-04-24: 09:00:00 20 mg via ORAL
  Filled 2018-04-24: qty 1

## 2018-04-24 MED ORDER — PREDNISONE 20 MG PO TABS: 40.0000 mg | ORAL_TABLET | Freq: Every day | ORAL | 0 refills | Status: AC

## 2018-04-24 MED ORDER — DIPHENHYDRAMINE HCL 25 MG PO CAPS: 25.0000 mg | ORAL_CAPSULE | Freq: Four times a day (QID) | ORAL | 0 refills | Status: DC | PRN

## 2018-04-24 MED ORDER — PREDNISONE 50 MG PO TABS
50.0000 mg | ORAL_TABLET | Freq: Every day | ORAL | Status: DC
  Administered 2018-04-24: 50 mg via ORAL
  Filled 2018-04-24: qty 1

## 2018-04-24 MED ORDER — DIPHENHYDRAMINE HCL 25 MG PO CAPS: 25.0000 mg | ORAL_CAPSULE | Freq: Four times a day (QID) | ORAL | Status: DC | PRN

## 2018-04-24 MED ORDER — GUAIFENESIN-DM 100-10 MG/5ML PO SYRP: 5.0000 mL | ORAL_SOLUTION | ORAL | 0 refills | Status: DC | PRN

## 2018-04-24 NOTE — Plan of Care (Signed)
Pt's facial edema is decreasing. Continues with mouth and tongue being sore.  Tolerating some po's.  No other issues noted at this time. Will continue to monitor.

## 2018-04-24 NOTE — Discharge Summary (Signed)
[facility] [place] [facility]   PATIENT NAME: David Sloan    MR#:  [id number]  DATE OF BIRTH:  April 06, 1941  DATE OF ADMISSION:  04/22/2018   ADMITTING PHYSICIAN: Auburn Bilberry, MD  DATE OF DISCHARGE: 04/24/2018  PRIMARY CARE PHYSICIAN: Gracelyn Nurse, MD   ADMISSION DIAGNOSIS:  Angioedema, initial encounter [T78.3XXA] DISCHARGE DIAGNOSIS:  Active Problems:   Angioedema  SECONDARY DIAGNOSIS:   Past Medical History:  Diagnosis Date  . Atypical chest pain    a. 04/2013 MV: nl EF, no ischemia.  Marland Kitchen BPH (benign prostatic hyperplasia)   . Chronic back pain   . Diabetes mellitus without complication (HCC)   . Diabetic neuropathy (HCC)   . Essential hypertension   . Falls   . GERD (gastroesophageal reflux disease)   . Memory disorder   . OSA (obstructive sleep apnea)   . Osteoarthritis    a. s/p bilat knee surgeries.  . Sinus bradycardia   . Stroke [facility])    a. ~ 2002 - no residual deficits;  b. 03/2015 Head CT: prior old infarcts and foci of small vesel dzs in pons bilat. ? bilat occipital lobe acute infarcts R>L.  Marland Kitchen Urinary incontinence    HOSPITAL COURSE:  Patient is 77 year old on Cozaar coming in with throat swelling tongue swelling  1.Acute angioedema due to Cozaar therapy Stopped Cozaar - improving on IV Solu-Medrol (can taper) IV Pepcid and Benadryl, changed to po prednisone, pepcid for 3 more days.  2.Essential hypertension: stable  3.Diabetes type 2 - on sliding scale insulin, resume oral meds  4.Hyperlipidemia - resume home medication  5.BPH resume oral medications.  DISCHARGE CONDITIONS:  Stable, discharge back to nursing home today. CONSULTS OBTAINED:   DRUG ALLERGIES:   Allergies  Allergen Reactions  . Losartan Potassium Swelling  . Tamsulosin Hcl Swelling   DISCHARGE MEDICATIONS:   Allergies as of 04/24/2018      Reactions   Losartan Potassium Swelling   Tamsulosin Hcl Swelling      Medication  List    STOP taking these medications   losartan 100 MG tablet Commonly known as:  COZAAR   pantoprazole 40 MG tablet Commonly known as:  PROTONIX     TAKE these medications   amLODipine 5 MG tablet Commonly known as:  NORVASC Take 5 mg by mouth daily. What changed:  Another medication with the same name was removed. Continue taking this medication, and follow the directions you see here.   aspirin 81 MG chewable tablet Chew 1 tablet (81 mg total) by mouth daily.   atorvastatin 40 MG tablet Commonly known as:  LIPITOR Take 40 mg by mouth daily.   cholecalciferol 25 MCG (1000 UT) tablet Commonly known as:  VITAMIN D Take 1,000 Units by mouth daily.   clopidogrel 75 MG tablet Commonly known as:  PLAVIX Take 75 mg by mouth daily.   diphenhydrAMINE 25 mg capsule Commonly known as:  BENADRYL Take 1 capsule (25 mg total) by mouth every 6 (six) hours as needed for up to 3 days for itching or allergies.   famotidine 20 MG tablet Commonly known as:  PEPCID Take 1 tablet (20 mg total) by mouth 2 (two) times daily for 3 days.   guaiFENesin-dextromethorphan 100-10 MG/5ML syrup Commonly known as:  ROBITUSSIN DM Take 5 mLs by mouth every 4 (four) hours as needed for cough (chest congestion).   insulin NPH Human 100 UNIT/ML injection Commonly known as:  HUMULIN N,NOVOLIN N 32 units before  breakfast and 24 units before supper. What changed:    how much to take  how to take this  when to take this  additional instructions   oxyCODONE-acetaminophen 7.5-325 MG tablet Commonly known as:  PERCOCET Take 1 tablet by mouth every 6 (six) hours as needed for moderate pain or severe pain.   predniSONE 20 MG tablet Commonly known as:  DELTASONE Take 2 tablets (40 mg total) by mouth daily with breakfast for 3 days.   tamsulosin 0.4 MG Caps capsule Commonly known as:  FLOMAX Take 0.4 mg by mouth daily.        DISCHARGE INSTRUCTIONS:  See AVS.  If you experience worsening  of your admission symptoms, develop shortness of breath, life threatening emergency, suicidal or homicidal thoughts you must seek medical attention immediately by calling 911 or calling your MD immediately  if symptoms less severe.  You Must read complete instructions/literature along with all the possible adverse reactions/side effects for all the Medicines you take and that have been prescribed to you. Take any new Medicines after you have completely understood and accpet all the possible adverse reactions/side effects.   Please note  You were cared for by a hospitalist during your hospital stay. If you have any questions about your discharge medications or the care you received while you were in the hospital after you are discharged, you can call the unit and asked to speak with the hospitalist on call if the hospitalist that took care of you is not available. Once you are discharged, your primary care physician will handle any further medical issues. Please note that NO REFILLS for any discharge medications will be authorized once you are discharged, as it is imperative that you return to your primary care physician (or establish a relationship with a primary care physician if you do not have one) for your aftercare needs so that they can reassess your need for medications and monitor your lab values.    On the day of Discharge:  VITAL SIGNS:  Blood pressure (!) 132/58, pulse (!) 52, temperature 98.2 F (36.8 C), temperature source Oral, resp. rate 18, height 5\' 5"  (1.651 m), weight 82.9 kg, SpO2 100 %. PHYSICAL EXAMINATION:  GENERAL:  77 y.o.-year-old patient lying in the bed with no acute distress.  EYES: Pupils equal, round, reactive to light and accommodation. No scleral icterus. Extraocular muscles intact.  HEENT: Head atraumatic, normocephalic. Oropharynx and nasopharynx clear. No tongue swelling. Much better lip swelling.  NECK:  Supple, no jugular venous distention. No thyroid  enlargement, no tenderness.  LUNGS: Normal breath sounds bilaterally, no wheezing, rales,rhonchi or crepitation. No use of accessory muscles of respiration.  CARDIOVASCULAR: S1, S2 normal. No murmurs, rubs, or gallops.  ABDOMEN: Soft, non-tender, non-distended. Bowel sounds present. No organomegaly or mass.  EXTREMITIES: No pedal edema, cyanosis, or clubbing.  NEUROLOGIC: Cranial nerves II through XII are intact. Muscle strength 4/5 in all extremities. Sensation intact. Gait not checked.  PSYCHIATRIC: The patient is alert and oriented x 3.  SKIN: No obvious rash, lesion, or ulcer.  DATA REVIEW:   CBC Recent Labs  Lab 04/24/18 0400  WBC 15.1*  HGB 11.2*  HCT 33.5*  PLT 216    Chemistries  Recent Labs  Lab 04/24/18 0400  NA 135  K 4.4  CL 104  CO2 26  GLUCOSE 292*  BUN 29*  CREATININE 0.88  CALCIUM 8.8*     Microbiology Results  Results for orders placed or performed during the hospital encounter  of 04/22/18  MRSA PCR Screening     Status: None   Collection Time: 04/23/18  5:43 AM  Result Value Ref Range Status   MRSA by PCR NEGATIVE NEGATIVE Final    Comment:        The GeneXpert MRSA Assay (FDA approved for NASAL specimens only), is one component of a comprehensive MRSA colonization surveillance program. It is not intended to diagnose MRSA infection nor to guide or monitor treatment for MRSA infections. Performed at [facility], [address] [place], [place] [address]     RADIOLOGY:  No results found.   Management plans discussed with the patient, family and they are in agreement.  CODE STATUS: Full Code   TOTAL TIME TAKING CARE OF THIS PATIENT: 32 minutes.    Shaune Pollack M.D on 04/24/2018 at 9:07 AM  Between 7am to 6pm - Pager - [phone]  After 6pm go to [email or web address] - Social research officer, government  [facility]  Office  [phone]  CC: Primary care physician; Gracelyn Nurse, MD   Note: This dictation  was prepared with Dragon dictation along with smaller phrase technology. Any transcriptional errors that result from this process are unintentional.

## 2018-04-24 NOTE — Progress Notes (Signed)
Report called to [facility], EMS called for transport, pt with no complaints

## 2018-04-24 NOTE — Clinical Social Work Note (Signed)
The patient will discharge today to [facility] via non-emergent EMS. The patient, his son, and the facility are aware and in agreement. The CSW has sent all needed documentation to the facility and has delivered the discharge packet to the patient's chart including hard scripts. The CSW is signing off. Please consult should needs arise.  Argentina Ponder, MSW, [facility]ors [phone]

## 2018-04-29 ENCOUNTER — Encounter: Payer: Medicare Other | Admitting: Nurse Practitioner

## 2018-12-07 ENCOUNTER — Encounter: Payer: Self-pay | Admitting: Urology

## 2018-12-07 ENCOUNTER — Other Ambulatory Visit: Payer: Self-pay

## 2018-12-07 ENCOUNTER — Ambulatory Visit (INDEPENDENT_AMBULATORY_CARE_PROVIDER_SITE_OTHER): Payer: Medicare Other | Admitting: Urology

## 2018-12-07 VITALS — BP 134/72 | HR 64

## 2018-12-07 DIAGNOSIS — R350 Frequency of micturition: Secondary | ICD-10-CM | POA: Diagnosis not present

## 2018-12-07 DIAGNOSIS — G4730 Sleep apnea, unspecified: Secondary | ICD-10-CM | POA: Diagnosis not present

## 2018-12-07 DIAGNOSIS — R351 Nocturia: Secondary | ICD-10-CM

## 2018-12-07 LAB — URINALYSIS, COMPLETE
Bilirubin, UA: NEGATIVE
Glucose, UA: NEGATIVE
Ketones, UA: NEGATIVE
Leukocytes,UA: NEGATIVE
Nitrite, UA: NEGATIVE
Protein,UA: NEGATIVE
RBC, UA: NEGATIVE
Specific Gravity, UA: 1.02 (ref 1.005–1.030)
Urobilinogen, Ur: 0.2 mg/dL (ref 0.2–1.0)
pH, UA: 7 (ref 5.0–7.5)

## 2018-12-07 LAB — MICROSCOPIC EXAMINATION
Bacteria, UA: NONE SEEN
RBC: NONE SEEN /hpf (ref 0–2)
WBC, UA: NONE SEEN /hpf (ref 0–5)

## 2018-12-07 LAB — BLADDER SCAN AMB NON-IMAGING

## 2018-12-07 NOTE — Patient Instructions (Signed)

## 2018-12-07 NOTE — Progress Notes (Signed)
12/07/2018 1:50 PM   KJELL BRANNEN [birth year] [id number]  Referring provider: Baxter Hire, MD [place],  [place] [address]  CC: Nocturia  HPI: I saw Mr. Renteria today in urology clinic in consultation for nocturia from Dr. Wynetta Emery.  He is a 78 year old male with a number of comorbidities who currently resides in a nursing home.  His chief complaint is nocturia 8-10 times per night that is been going on for the last 9 months.  He does note that this started when he moved into the nursing home, and stopped using his CPAP machine at home for sleep apnea.  He drinks 12 ounces of ginger ale at lunch, but does not drink anything in the evenings.  He denies any significant urinary symptoms during the day.  He denies any gross hematuria, weak stream, feeling of incomplete emptying, or significant urinary incontinence.  He was previously started on Flomax and Myrbetriq 25 mg daily.  He is unsure if these medications have improved his symptoms.  There are no aggravating factors.  Severity is moderate to severe.  Denies history of UTIs.  PVR in clinic today 33 cc.  PMH: Past Medical History:  Diagnosis Date  . Atypical chest pain    a. 04/2013 MV: nl EF, no ischemia.  Marland Kitchen BPH (benign prostatic hyperplasia)   . Chronic back pain   . Diabetes mellitus without complication ([place])   . Diabetic neuropathy ([place])   . Essential hypertension   . Falls   . GERD (gastroesophageal reflux disease)   . Memory disorder   . OSA (obstructive sleep apnea)   . Osteoarthritis    a. s/p bilat knee surgeries.  . Sinus bradycardia   . Stroke [facility])    a. ~ 2002 - no residual deficits;  b. 03/2015 Head CT: prior old infarcts and foci of small vesel dzs in pons bilat. ? bilat occipital lobe acute infarcts R>L.  Marland Kitchen Urinary incontinence     Surgical History: Past Surgical History:  Procedure Laterality Date  . JOINT REPLACEMENT     bilateral knee replacements    Allergies:  Allergies   Allergen Reactions  . Losartan Potassium Swelling  . Tamsulosin Hcl Swelling    Family History: Family History  Problem Relation Age of Onset  . Diabetes Mother   . Alzheimer's disease Father     Social History:  reports that he has quit smoking. His smoking use included cigarettes. He has a 25.00 pack-year smoking history. He has never used smokeless tobacco. He reports that he does not drink alcohol or use drugs.  ROS: Please see flowsheet from today's date for complete review of systems.  Physical Exam: BP 134/72   Pulse 64    Constitutional:  Alert and oriented, No acute distress.  In wheelchair Cardiovascular: No clubbing, cyanosis, or edema. Respiratory: Normal respiratory effort, no increased work of breathing. GI: Abdomen is soft, nontender, nondistended, no abdominal masses GU: No CVA tenderness DRE: Deferred Lymph: No cervical or inguinal lymphadenopathy. Skin: No rashes, bruises or suspicious lesions. Psychiatric: Normal mood and affect.  Assessment & Plan:   In summary, the patient is a 78 year old male with a number of comorbidities and bothersome nocturia 8-10x per night since he stopped using his CPAP for sleep apnea.  We discussed at length the strong correlation between poorly managed sleep apnea and nocturia.  I strongly encouraged him to resume using his CPAP, which I feel will improve his nocturia.  We also discussed behavioral  strategies including minimizing fluids in the evening, and double voiding prior to bed.  -Resume using CPAP for sleep apnea -RTC  3 months telephone visit for symptom check, consider increasing mybetriq if ongoing symptoms  Sondra ComeBrian C Sherron Mapp, MD  [facility] [address], [address] [place], [place] [address] [phone]

## 2018-12-09 ENCOUNTER — Ambulatory Visit: Payer: Medicare Other | Admitting: Urology

## 2019-03-17 ENCOUNTER — Telehealth (INDEPENDENT_AMBULATORY_CARE_PROVIDER_SITE_OTHER): Payer: Medicare Other | Admitting: Urology

## 2019-03-17 ENCOUNTER — Other Ambulatory Visit: Payer: Self-pay

## 2019-03-17 DIAGNOSIS — R351 Nocturia: Secondary | ICD-10-CM

## 2019-03-17 MED ORDER — MIRABEGRON ER 50 MG PO TB24: 50.0000 mg | ORAL_TABLET | Freq: Every day | ORAL | 11 refills | Status: AC

## 2019-03-17 NOTE — Progress Notes (Signed)
Virtual Visit via Telephone Note  I connected with David Sloan on 03/17/19 at 10:45 AM EDT by telephone and verified that I am speaking with the correct person using two identifiers.   I discussed the limitations, risks, security and privacy concerns of performing an evaluation and management service by telephone and the availability of in person appointments. We discussed the impact of the COVID-19 on the healthcare system, and the importance of social distancing and reducing patient and provider exposure. I also discussed with the patient that there may be a patient responsible charge related to this service. The patient expressed understanding and agreed to proceed.  Reason for visit: Nocturia  History of Present Illness: I had phone follow-up with David Sloan today regarding his urinary symptoms.  He is a 78 year old very comorbid male who lives in a facility who I originally saw in June 2020 for worsening nocturia 6-8 times per night.  He had discontinued use of his CPAP machine which correlated with the onset of his worsening nocturia.  At that visit we had recommended resuming CPAP.  He is already on Flomax and 25 mg Myrbetriq daily.  His symptoms are primarily bothersome in the evenings.  He does drink diet ginger ale throughout the day.  PVR last visit was 30 mL.  He reports ongoing bothersome nocturia despite resuming use of his CPAP machine in the evenings.  He continues to drink diet ginger ale in the afternoon and evening.  He is very bothered by his symptoms and is wondering if there are any surgical options.  We discussed at length that there are not surgical options for nocturia exclusively.  I recommended increasing his Myrbetriq to 50 mg daily, and minimizing the diet sodas in his diet, especially in the evening.  We also discussed behavioral strategies like minimizing fluid in the evening and voiding twice before bed.  Follow Up: 8 weeks for repeat virtual visit   I discussed  the assessment and treatment plan with the patient. The patient was provided an opportunity to ask questions and all were answered. The patient agreed with the plan and demonstrated an understanding of the instructions.   The patient was advised to call back or seek an in-person evaluation if the symptoms worsen or if the condition fails to improve as anticipated.  I provided 15 minutes of non-face-to-face time during this encounter.   Billey Co, MD

## 2019-03-18 ENCOUNTER — Telehealth: Payer: Self-pay | Admitting: Urology

## 2019-03-18 NOTE — Telephone Encounter (Signed)
Virtual Appt Scheduled and mailed to pt.

## 2019-03-18 NOTE — Telephone Encounter (Signed)
-----   Message from Billey Co, MD sent at 03/17/2019 12:59 PM EDT ----- Regarding: follow up Please schedule virtual phone visit in 6 to 8 weeks  Nickolas Madrid, MD 03/17/2019

## 2019-03-21 ENCOUNTER — Telehealth: Payer: Self-pay | Admitting: Urology

## 2019-03-21 NOTE — Telephone Encounter (Signed)
Johny Blamer from [employer] called and stated that we advised the patient to increase a medication but she did not know what is was and that she needed that information and she also needed a faxed order for the increase. The fax number is [phone] She can be reached @ [phone] with questions    Thanks, Sharyn Lull

## 2019-03-22 NOTE — Telephone Encounter (Signed)
Spoke with Elmyra Ricks and notified her that per Dr. Doristine Counter note on 10-1 patient is to increase Myrbetriq to 50 mg daily. She took a verbal order

## 2019-05-26 ENCOUNTER — Other Ambulatory Visit: Payer: Self-pay

## 2019-05-26 ENCOUNTER — Telehealth: Payer: Medicare Other | Admitting: Urology

## 2019-05-26 ENCOUNTER — Encounter: Payer: Self-pay | Admitting: Urology

## 2019-06-21 NOTE — Progress Notes (Signed)
06/22/2019 11:01 AM   Henriette Combs Sep 29, 1940 [id number]  Referring provider: Gracelyn Nurse, MD [address] [place],  [place] [address]  CC: Nocturia  HPI: Mr. Coward is a 79 year old male with a number of comorbidities who currently resides in a nursing home who presents today for follow up with his son, Scotty.   BPH WITH LUTS  (prostate and/or bladder) IPSS score: 10/1    PVR: 37 mL    Previous PVR: 33 mL  Major complaint(s):  Nocturia x 3-4.   Denies any dysuria, hematuria or suprapubic pain.   Currently taking: Myrbetriq 50 mg daily and tamsulosin 0.4 mg daily.  He states he noticed no difference with the Myrbetriq 50 mg daily, but he stated he put pleased on his quality life score because he feels that he just has to except his urinary situation the way it is.     Denies any recent fevers, chills, nausea or vomiting.  IPSS    Row Name 06/22/19 1000         International Prostate Symptom Score   How often have you had the sensation of not emptying your bladder?  Not at All     How often have you had to urinate less than every two hours?  Almost always     How often have you found you stopped and started again several times when you urinated?  Not at All     How often have you found it difficult to postpone urination?  Not at All     How often have you had a weak urinary stream?  Not at All     How often have you had to strain to start urination?  Not at All     How many times did you typically get up at night to urinate?  5 Times     Total IPSS Score  10       Quality of Life due to urinary symptoms   If you were to spend the rest of your life with your urinary condition just the way it is now how would you feel about that?  Pleased        Score:  1-7 Mild 8-19 Moderate 20-35 Severe  PMH: Past Medical History:  Diagnosis Date  . Atypical chest pain    a. 04/2013 MV: nl EF, no ischemia.  Marland Kitchen BPH (benign prostatic hyperplasia)   . Chronic back pain    . Diabetes mellitus without complication (HCC)   . Diabetic neuropathy (HCC)   . Essential hypertension   . Falls   . GERD (gastroesophageal reflux disease)   . Memory disorder   . OSA (obstructive sleep apnea)   . Osteoarthritis    a. s/p bilat knee surgeries.  . Sinus bradycardia   . Stroke [facility])    a. ~ 2002 - no residual deficits;  b. 03/2015 Head CT: prior old infarcts and foci of small vesel dzs in pons bilat. ? bilat occipital lobe acute infarcts R>L.  Marland Kitchen Urinary incontinence     Surgical History: Past Surgical History:  Procedure Laterality Date  . JOINT REPLACEMENT     bilateral knee replacements    Allergies:  Allergies  Allergen Reactions  . Losartan Potassium Swelling  . Tamsulosin Hcl Swelling    Family History: Family History  Problem Relation Age of Onset  . Diabetes Mother   . Alzheimer's disease Father     Social History:  reports that he  has quit smoking. His smoking use included cigarettes. He has a 25.00 pack-year smoking history. He has never used smokeless tobacco. He reports that he does not drink alcohol or use drugs.  ROS: Please see flowsheet from today's date for complete review of systems.  Physical Exam: BP 126/69   Pulse 91   Ht 5\' 5"  (1.651 m)   BMI 30.41 kg/m   Constitutional:  Well nourished. Alert and oriented, No acute distress. HEENT: [place] AT, moist mucus membranes.  Trachea midline, no masses. Cardiovascular: No clubbing, cyanosis, or edema. Respiratory: Normal respiratory effort, no increased work of breathing. Neurologic: Grossly intact, no focal deficits, moving all 4 extremities. Psychiatric: Normal mood and affect.   Assessment & Plan:    1. BPH with LUTS IPSS score is 10/1 Continue conservative management, avoiding bladder irritants and timed voiding's Most bothersome symptoms is/are nocturia Does not find the Myrbetriq helpful, but he does not want to discontinue the medication Will add Toviaz 4 mg  daily Continue tamsulosin 0.4 mg daily - refills given RTC in 3 weeks for I PSS and PVR   2. Nocturia We discussed how the nocturia is a multifactorial issue and very difficult to resolve I encouraged Mr. Bart to continue restricting fluids after 6 PM and restricting caffeinated beverages after lunch time I have also suggested that his CPAP machine may need to be recalibrated His last hemoglobin A1c in October was 8 and I explained that elevated blood sugars can contribute to urinary with issues as well  Zara Council, PA-C  [facility] [address] [place],  [address] [phone]

## 2019-06-22 ENCOUNTER — Encounter: Payer: Self-pay | Admitting: Urology

## 2019-06-22 ENCOUNTER — Ambulatory Visit (INDEPENDENT_AMBULATORY_CARE_PROVIDER_SITE_OTHER): Payer: Medicare Other | Admitting: Urology

## 2019-06-22 ENCOUNTER — Other Ambulatory Visit: Payer: Self-pay

## 2019-06-22 VITALS — BP 126/69 | HR 91 | Ht 65.0 in

## 2019-06-22 DIAGNOSIS — R351 Nocturia: Secondary | ICD-10-CM | POA: Diagnosis not present

## 2019-06-22 DIAGNOSIS — N401 Enlarged prostate with lower urinary tract symptoms: Secondary | ICD-10-CM | POA: Diagnosis not present

## 2019-06-22 DIAGNOSIS — N138 Other obstructive and reflux uropathy: Secondary | ICD-10-CM

## 2019-06-22 LAB — BLADDER SCAN AMB NON-IMAGING: Scan Result: 37

## 2019-06-22 MED ORDER — FESOTERODINE FUMARATE ER 4 MG PO TB24: 4.0000 mg | ORAL_TABLET | Freq: Every day | ORAL | 0 refills | Status: DC

## 2019-07-04 ENCOUNTER — Telehealth: Payer: Self-pay | Admitting: Family Medicine

## 2019-07-04 NOTE — Telephone Encounter (Signed)
David Sloan from [facility] that this patient is refusing to take a medication, she did not say which one she woudl like a call back to discuss. I looked and saw that he just saw [facility]. can you call her please @ [phone] ext [phone]

## 2019-07-04 NOTE — Telephone Encounter (Signed)
I spoke to [place] and she states the patient is complaining of heart pain and headaches. Per Carollee Herter he may discontinue the Toviaz.

## 2019-07-13 ENCOUNTER — Ambulatory Visit (INDEPENDENT_AMBULATORY_CARE_PROVIDER_SITE_OTHER): Payer: Medicare Other | Admitting: Urology

## 2019-07-13 ENCOUNTER — Other Ambulatory Visit: Payer: Self-pay

## 2019-07-13 ENCOUNTER — Encounter: Payer: Self-pay | Admitting: Urology

## 2019-07-13 VITALS — BP 148/62 | HR 77 | Ht 65.0 in

## 2019-07-13 DIAGNOSIS — N138 Other obstructive and reflux uropathy: Secondary | ICD-10-CM | POA: Diagnosis not present

## 2019-07-13 DIAGNOSIS — N401 Enlarged prostate with lower urinary tract symptoms: Secondary | ICD-10-CM | POA: Diagnosis not present

## 2019-07-13 DIAGNOSIS — R351 Nocturia: Secondary | ICD-10-CM | POA: Diagnosis not present

## 2019-07-13 LAB — BLADDER SCAN AMB NON-IMAGING: Scan Result: 8

## 2019-07-13 NOTE — Progress Notes (Addendum)
07/13/2019 10:44 AM   David Sloan Mar 20, 1941 [id number]  Referring provider: Gracelyn Nurse, MD [address] [place],  [place] [address]  CC: Nocturia  HPI: David Sloan is a 79 year old male with a number of comorbidities who currently resides in a nursing home who presents today for follow up.   BPH WITH LUTS  (prostate and/or bladder) I PSS score:  19/2      PVR: 8 mL    Previous IPSS score: 10/1    Previous PVR: 37 mL  Major complaint(s):  Nocturia x 5-6.   Denies any dysuria, hematuria or suprapubic pain.   Currently taking: Finasteride 5 mg daily, Myrbetriq 50 mg daily and tamsulosin 0.4 mg daily.   He states the Toviaz caused his heart rate to drop and discontinued the medication.  He sleeps with his CPAP machine.    He has stopped sodas and teas.  He has also stopped drinking fluids after 6 pm.     Denies any recent fevers, chills, nausea or vomiting.  IPSS    Row Name 07/13/19 0900         International Prostate Symptom Score   How often have you had the sensation of not emptying your bladder?  Not at All     How often have you had to urinate less than every two hours?  Almost always     How often have you found you stopped and started again several times when you urinated?  Almost always     How often have you found it difficult to postpone urination?  Almost always     How often have you had a weak urinary stream?  Not at All     How often have you had to strain to start urination?  Not at All     How many times did you typically get up at night to urinate?  4 Times     Total IPSS Score  19       Quality of Life due to urinary symptoms   If you were to spend the rest of your life with your urinary condition just the way it is now how would you feel about that?  Mostly Satisfied        Score:  1-7 Mild 8-19 Moderate 20-35 Severe  PMH: Past Medical History:  Diagnosis Date  . Atypical chest pain    a. 04/2013 MV: nl EF, no ischemia.  Marland Kitchen BPH  (benign prostatic hyperplasia)   . Chronic back pain   . Diabetes mellitus without complication (HCC)   . Diabetic neuropathy (HCC)   . Essential hypertension   . Falls   . GERD (gastroesophageal reflux disease)   . Memory disorder   . OSA (obstructive sleep apnea)   . Osteoarthritis    a. s/p bilat knee surgeries.  . Sinus bradycardia   . Stroke [facility])    a. ~ 2002 - no residual deficits;  b. 03/2015 Head CT: prior old infarcts and foci of small vesel dzs in pons bilat. ? bilat occipital lobe acute infarcts R>L.  Marland Kitchen Urinary incontinence     Surgical History: Past Surgical History:  Procedure Laterality Date  . JOINT REPLACEMENT     bilateral knee replacements    Allergies:  Allergies  Allergen Reactions  . Losartan Potassium Swelling  . Tamsulosin Hcl Swelling    Family History: Family History  Problem Relation Age of Onset  . Diabetes Mother   . Alzheimer's  disease Father     Social History:  reports that he has quit smoking. His smoking use included cigarettes. He has a 25.00 pack-year smoking history. He has never used smokeless tobacco. He reports that he does not drink alcohol or use drugs.  ROS: Please see flowsheet from today's date for complete review of systems.  Physical Exam: BP (!) 148/62   Pulse 77   Ht 5\' 5"  (1.651 m)   BMI 30.41 kg/m   Constitutional:  Well nourished. Alert and oriented, No acute distress. HEENT: [place] AT, mask in place.  Trachea midline, no masses. Cardiovascular: No clubbing, cyanosis, or edema. Respiratory: Normal respiratory effort, no increased work of breathing. Neurologic: Grossly intact, no focal deficits, moving all 4 extremities. Psychiatric: Normal mood and affect.  Pertinent Imaging Results for LORD, LANCOUR (MRN [id number]) as of 07/13/2019 10:44  Ref. Range 07/13/2019 10:43  Scan Result Unknown 8 mL    Assessment & Plan:    1. BPH with LUTS IPSS score is 19/2, it is worsening Continue conservative management,  avoiding bladder irritants and timed voiding's Most bothersome symptoms is/are nocturia Continue tamsulosin 0.4 mg daily, finasteride 5 mg daily and Myrbetriq 50 mg daily RTC for PTNS  2. Nocturia We again discussed how the nocturia is a multifactorial issue and very difficult to resolve and that he is on maximum medical therapy and has just about reached the end of the algorithm for treatment - we can have a trial of PTNS, although I am not convinced that it would be helpful - he would like to try Continue restricting fluids after 6 PM and restricting caffeinated beverages after lunch time He states his CPAP has been checked and his sugars have been good  Zara Council, PA-C  [facility] [address], [place], [place] [address] [phone]

## 2019-08-01 ENCOUNTER — Other Ambulatory Visit: Payer: Self-pay

## 2019-08-01 ENCOUNTER — Encounter: Payer: Self-pay | Admitting: Physician Assistant

## 2019-08-01 ENCOUNTER — Ambulatory Visit (INDEPENDENT_AMBULATORY_CARE_PROVIDER_SITE_OTHER): Payer: Medicare Other | Admitting: Physician Assistant

## 2019-08-01 VITALS — BP 149/69 | HR 50 | Ht 71.0 in | Wt 182.0 lb

## 2019-08-01 DIAGNOSIS — R351 Nocturia: Secondary | ICD-10-CM

## 2019-08-01 NOTE — Progress Notes (Signed)
PTNS  Session # 1  Health & Social Factors: N/A Caffeine: 0 Alcohol: 0 Daytime voids #per day: 4 Night-time voids #per night: 6 Urgency: mild Incontinence Episodes #per day: 0 Ankle used: left Treatment Setting: 12 Feeling/ Response: toe flex Comments: sensory response was cutaneous at the insertion site, however strong toe flex was elicited. Patient tolerated well.  Performed By: Carman Ching, PA-C   Follow Up: 1 week for PTNS #2

## 2019-08-08 ENCOUNTER — Ambulatory Visit (INDEPENDENT_AMBULATORY_CARE_PROVIDER_SITE_OTHER): Payer: Medicare Other | Admitting: Physician Assistant

## 2019-08-08 ENCOUNTER — Other Ambulatory Visit: Payer: Self-pay

## 2019-08-08 DIAGNOSIS — R351 Nocturia: Secondary | ICD-10-CM

## 2019-08-08 NOTE — Progress Notes (Signed)
PTNS  Session # 2 of 12  Health & Social Factors: no change Caffeine: 1 Alcohol: 0 Daytime voids #per day: 5-6 Night-time voids #per night: 5-6 Urgency: mild Incontinence Episodes #per day: 0 Ankle used: left Treatment Setting: 6 Feeling/ Response: sensory Comments: patient tolerated well  Performed By: Carman Ching, PA-C   Follow Up: 1 week for PTNS #3

## 2019-08-15 ENCOUNTER — Other Ambulatory Visit: Payer: Self-pay

## 2019-08-15 ENCOUNTER — Encounter: Payer: Self-pay | Admitting: Physician Assistant

## 2019-08-15 ENCOUNTER — Ambulatory Visit (INDEPENDENT_AMBULATORY_CARE_PROVIDER_SITE_OTHER): Payer: Medicare Other | Admitting: Physician Assistant

## 2019-08-15 VITALS — Ht 70.0 in | Wt 300.0 lb

## 2019-08-15 DIAGNOSIS — R351 Nocturia: Secondary | ICD-10-CM | POA: Diagnosis not present

## 2019-08-15 NOTE — Progress Notes (Signed)
PTNS  Session # 3  Health & Social Factors: no change Caffeine: 1 Alcohol: 0 Daytime voids #per day: 8 Night-time voids #per night: 8 Urgency: strong Incontinence Episodes #per day: 0 Ankle used: left Treatment Setting: 6 Feeling/ Response: sensory Comments: patient tolerated well.  Performed By: Carman Ching, PA-C   Follow Up: 1 week for PTNS #4

## 2019-08-22 ENCOUNTER — Ambulatory Visit (INDEPENDENT_AMBULATORY_CARE_PROVIDER_SITE_OTHER): Payer: Medicare Other | Admitting: Physician Assistant

## 2019-08-22 ENCOUNTER — Other Ambulatory Visit: Payer: Self-pay

## 2019-08-22 DIAGNOSIS — R351 Nocturia: Secondary | ICD-10-CM | POA: Diagnosis not present

## 2019-08-22 NOTE — Progress Notes (Signed)
PTNS  Session # 4  Health & Social Factors: N/A Caffeine: 1 Alcohol: 0 Daytime voids #per day: 8 Night-time voids #per night: 8 Urgency: Strong Incontinence Episodes #per day: 0 Ankle used: Right Treatment Setting: 3 Feeling/ Response: Sensory & Toe Flex Comments: N/A  Preformed By: Debbe Bales, CMA   Follow Up: RTC as scheduled in 1 week

## 2019-08-29 ENCOUNTER — Other Ambulatory Visit: Payer: Self-pay

## 2019-08-29 ENCOUNTER — Ambulatory Visit (INDEPENDENT_AMBULATORY_CARE_PROVIDER_SITE_OTHER): Payer: Medicare Other

## 2019-08-29 DIAGNOSIS — R351 Nocturia: Secondary | ICD-10-CM | POA: Diagnosis not present

## 2019-08-29 NOTE — Progress Notes (Signed)
PTNS  Session # 5  Health & Social Factors: N/A Caffeine: 1 Alcohol: 0 Daytime voids #per day: 8 Night-time voids #per night: 8 Urgency: Strong Incontinence Episodes #per day: 0 Ankle used: Left Treatment Setting: 7 Feeling/ Response: Sensory & Toe Flex Comments: N/A   Preformed By: Debbe Bales, CMA   Follow Up: RTC in 1 week as scheduled

## 2019-09-05 ENCOUNTER — Ambulatory Visit (INDEPENDENT_AMBULATORY_CARE_PROVIDER_SITE_OTHER): Payer: Medicare Other

## 2019-09-05 ENCOUNTER — Other Ambulatory Visit: Payer: Self-pay

## 2019-09-05 DIAGNOSIS — R351 Nocturia: Secondary | ICD-10-CM

## 2019-09-05 NOTE — Progress Notes (Signed)
PTNS  Session # 6  Health & Social Factors: N/A Caffeine:1 Alcohol: 0 Daytime voids #per day: 8 Night-time voids #per night: 8 Urgency: Strong Incontinence Episodes #per day: 0 Ankle used: Right Treatment Setting: 10 Feeling/ Response: Sensory   Comments: N/A   Preformed By: Debbe Bales, CMA

## 2019-09-12 ENCOUNTER — Other Ambulatory Visit: Payer: Self-pay

## 2019-09-12 ENCOUNTER — Ambulatory Visit (INDEPENDENT_AMBULATORY_CARE_PROVIDER_SITE_OTHER): Payer: Medicare Other | Admitting: Physician Assistant

## 2019-09-12 DIAGNOSIS — R351 Nocturia: Secondary | ICD-10-CM

## 2019-09-12 NOTE — Progress Notes (Signed)
PTNS  Session # 7  Health & Social Factors: no change Caffeine: 0 Alcohol: 0 Daytime voids #per day: 6 Night-time voids #per night: 8 Urgency: strong Incontinence Episodes #per day: 0 Ankle used: left Treatment Setting: 13 Feeling/ Response: both Comments: patient tolerated well  Performed By: Carman Ching, PA-C   Follow Up: 1 week for PTNS #8

## 2019-09-19 ENCOUNTER — Ambulatory Visit: Payer: Self-pay | Admitting: Physician Assistant

## 2019-09-19 ENCOUNTER — Other Ambulatory Visit: Payer: Self-pay

## 2019-09-19 ENCOUNTER — Ambulatory Visit (INDEPENDENT_AMBULATORY_CARE_PROVIDER_SITE_OTHER): Payer: Medicare Other

## 2019-09-19 DIAGNOSIS — R351 Nocturia: Secondary | ICD-10-CM

## 2019-09-19 NOTE — Progress Notes (Signed)
PTNS  Session # 8  Health & Social Factors: 0 Caffeine: 0 Alcohol: 0 Daytime voids #per day: 7-8 Night-time voids #per night: 7-8 Urgency: mild Incontinence Episodes #per day: 0 Ankle used: Right Treatment Setting: 3 Feeling/ Response: tingling, toe flex Comments: Patient tolerated procedure well  Preformed By: Gerarda Gunther, RMA  Follow Up: 1 week

## 2019-09-26 ENCOUNTER — Other Ambulatory Visit: Payer: Self-pay

## 2019-09-26 ENCOUNTER — Ambulatory Visit (INDEPENDENT_AMBULATORY_CARE_PROVIDER_SITE_OTHER): Payer: Medicare Other

## 2019-09-26 DIAGNOSIS — R351 Nocturia: Secondary | ICD-10-CM

## 2019-09-26 NOTE — Progress Notes (Signed)
PTNS  Session # 9  Health & Social Factors: No change  Caffeine: 1 Alcohol: 0 Daytime voids #per day: 6 Night-time voids #per night: 6 Urgency: Mild Incontinence Episodes #per day: 5-6 Ankle used: Right Treatment Setting: 3 Feeling/ Response: Toe flex & sensory Comments: Patient tolerated well  Preformed By: Franchot Erichsen, CMA   Follow Up: One week

## 2019-10-03 ENCOUNTER — Ambulatory Visit (INDEPENDENT_AMBULATORY_CARE_PROVIDER_SITE_OTHER): Payer: Medicare Other

## 2019-10-03 ENCOUNTER — Other Ambulatory Visit: Payer: Self-pay

## 2019-10-03 DIAGNOSIS — R351 Nocturia: Secondary | ICD-10-CM

## 2019-10-03 NOTE — Progress Notes (Signed)
PTNS  Session # 10  Health & Social Factors: No Change  Caffeine: 1 Alcohol: 0 Daytime voids #per day: 6 Night-time voids #per night: 6 Urgency: Mild Incontinence Episodes #per day: 5-6 Ankle used: Right Treatment Setting: 10 Feeling/ Response: Toe Flex Comments: N/A  Preformed By: Debbe Bales, CMA   Follow Up: RTC 1 week

## 2019-10-10 ENCOUNTER — Ambulatory Visit: Payer: Self-pay

## 2019-10-11 ENCOUNTER — Other Ambulatory Visit: Payer: Self-pay

## 2019-10-11 ENCOUNTER — Ambulatory Visit (INDEPENDENT_AMBULATORY_CARE_PROVIDER_SITE_OTHER): Payer: Medicare Other | Admitting: *Deleted

## 2019-10-11 ENCOUNTER — Other Ambulatory Visit: Payer: Self-pay | Admitting: *Deleted

## 2019-10-11 DIAGNOSIS — R351 Nocturia: Secondary | ICD-10-CM

## 2019-10-11 NOTE — Progress Notes (Signed)
PTNS  Session # 11  Health & Social Factors: No Change  Caffeine: 1 Alcohol: 0 Daytime voids #per day: 6 Night-time voids #per night: 6 Urgency: Mild Incontinence Episodes #per day: 5-6 Ankle used: Right Treatment Setting: 5 Feeling/ Response: Toe Flex Comments: N/A  Preformed By: Ples Specter CMA   Follow Up: RTC 1 week

## 2019-10-17 ENCOUNTER — Ambulatory Visit: Payer: Self-pay | Admitting: Physician Assistant

## 2019-10-17 ENCOUNTER — Ambulatory Visit (INDEPENDENT_AMBULATORY_CARE_PROVIDER_SITE_OTHER): Payer: Medicare Other

## 2019-10-17 ENCOUNTER — Other Ambulatory Visit: Payer: Self-pay

## 2019-10-17 DIAGNOSIS — R351 Nocturia: Secondary | ICD-10-CM | POA: Diagnosis not present

## 2019-10-17 NOTE — Progress Notes (Signed)
PTNS  Session # 12  Health & Social Factors: N/A Caffeine: 1 Alcohol: 0 Daytime voids #per day: 6 Night-time voids #per night: 6 Urgency: Mild Incontinence Episodes #per day: 5 Ankle used: Right Treatment Setting: 10 Feeling/ Response: Toe Flex Comments: Last treatment, 1 month appt scheduled to follow up with provider.   Preformed By: Debbe Bales, CMA   Follow Up: RTC in 1 month to f/u with provider

## 2019-11-22 ENCOUNTER — Encounter: Payer: Self-pay | Admitting: Urology

## 2019-11-22 ENCOUNTER — Ambulatory Visit (INDEPENDENT_AMBULATORY_CARE_PROVIDER_SITE_OTHER): Payer: Medicare Other | Admitting: Urology

## 2019-11-22 ENCOUNTER — Other Ambulatory Visit: Payer: Self-pay

## 2019-11-22 VITALS — BP 132/75 | HR 85 | Ht 67.0 in | Wt 236.0 lb

## 2019-11-22 DIAGNOSIS — R351 Nocturia: Secondary | ICD-10-CM | POA: Diagnosis not present

## 2019-11-22 DIAGNOSIS — N138 Other obstructive and reflux uropathy: Secondary | ICD-10-CM | POA: Diagnosis not present

## 2019-11-22 DIAGNOSIS — N401 Enlarged prostate with lower urinary tract symptoms: Secondary | ICD-10-CM | POA: Diagnosis not present

## 2019-11-22 NOTE — Progress Notes (Signed)
11/15/19 11:44 AM   Lajean Saver 03/01/1941 [id number]  Referring provider: Baxter Hire, MD [place],  [place] [address] Chief Complaint  Patient presents with  . Follow-up    PTNS    HPI: David Sloan is a 79 y.o.  male who presents today for a follow up after 12 weekly treatments of PTNS.  Major complaint(s):  Nocturia x 5-6 and urinary frequency.  He feels that the PTNS was helpful, but he couldn't be more specific.    According to the [facility], he is taking: Finasteride 5 mg daily, Myrbetriq 50 mg daily and tamsulosin 0.4 mg daily.   He states the Toviaz caused his heart rate to drop and discontinued the medication.  He sleeps with his CPAP machine   I spoke with his daughter Solmon Ice and she states that staff notified her that they feel like the PTNS therapy has decreased his incontinence.   Patient denies any modifying or aggravating factors.  Patient denies any gross hematuria, dysuria or suprapubic/flank pain.  Patient denies any fevers, chills, nausea or vomiting.   PMH: Past Medical History:  Diagnosis Date  . Atypical chest pain    a. 04/2013 MV: nl EF, no ischemia.  Marland Kitchen BPH (benign prostatic hyperplasia)   . Chronic back pain   . Diabetes mellitus without complication ([place])   . Diabetic neuropathy ([place])   . Essential hypertension   . Falls   . GERD (gastroesophageal reflux disease)   . Memory disorder   . OSA (obstructive sleep apnea)   . Osteoarthritis    a. s/p bilat knee surgeries.  . Sinus bradycardia   . Stroke [facility])    a. ~ 2002 - no residual deficits;  b. 03/2015 Head CT: prior old infarcts and foci of small vesel dzs in pons bilat. ? bilat occipital lobe acute infarcts R>L.  Marland Kitchen Urinary incontinence     Surgical History: Past Surgical History:  Procedure Laterality Date  . JOINT REPLACEMENT     bilateral knee replacements    Home Medications:  Allergies as of 11/22/2019      Reactions   Losartan Potassium Swelling   Tamsulosin Hcl Swelling      Medication List       Accurate as of November 22, 2019 11:44 AM. If you have any questions, ask your nurse or doctor.        amLODipine 5 MG tablet Commonly known as: NORVASC Take 5 mg by mouth daily.   aspirin 81 MG chewable tablet Chew 1 tablet (81 mg total) by mouth daily.   atorvastatin 40 MG tablet Commonly known as: LIPITOR Take 40 mg by mouth daily.   cholecalciferol 25 MCG (1000 UNIT) tablet Commonly known as: VITAMIN D Take 1,000 Units by mouth daily.   clopidogrel 75 MG tablet Commonly known as: PLAVIX Take 75 mg by mouth daily.   diphenhydrAMINE 25 mg capsule Commonly known as: BENADRYL Take 1 capsule (25 mg total) by mouth every 6 (six) hours as needed for up to 3 days for itching or allergies.   famotidine 20 MG tablet Commonly known as: PEPCID Take 1 tablet (20 mg total) by mouth 2 (two) times daily for 3 days.   fesoterodine 4 MG Tb24 tablet Commonly known as: TOVIAZ Take 1 tablet (4 mg total) by mouth daily.   finasteride 5 MG tablet Commonly known as: PROSCAR Take 5 mg by mouth daily.   gabapentin 100 MG capsule Commonly known as: NEURONTIN  guaiFENesin-dextromethorphan 100-10 MG/5ML syrup Commonly known as: ROBITUSSIN DM Take 5 mLs by mouth every 4 (four) hours as needed for cough (chest congestion).   insulin NPH Human 100 UNIT/ML injection Commonly known as: HumuLIN N 32 units before breakfast and 24 units before supper. What changed:   how much to take  how to take this  when to take this  additional instructions   metFORMIN 500 MG 24 hr tablet Commonly known as: GLUCOPHAGE-XR Take 500 mg by mouth 2 (two) times daily.   mirabegron ER 50 MG Tb24 tablet Commonly known as: Myrbetriq Take 1 tablet (50 mg total) by mouth daily.   pantoprazole 20 MG tablet Commonly known as: PROTONIX Take 20 mg by mouth daily.   tamsulosin 0.4 MG Caps capsule Commonly known as: FLOMAX Take 0.4 mg by mouth daily.         Allergies:  Allergies  Allergen Reactions  . Losartan Potassium Swelling  . Tamsulosin Hcl Swelling    Family History: Family History  Problem Relation Age of Onset  . Diabetes Mother   . Alzheimer's disease Father     Social History:  reports that he has quit smoking. His smoking use included cigarettes. He has a 25.00 pack-year smoking history. He has never used smokeless tobacco. He reports that he does not drink alcohol or use drugs.   Physical Exam: BP 132/75   Pulse 85   Ht 5\' 7"  (1.702 m)   Wt 236 lb (107 kg)   BMI 36.96 kg/m   Constitutional:  Alert and oriented, No acute distress. HEENT: [place] AT, mask is in place.  Trachea midline. Cardiovascular: No clubbing, cyanosis, or edema. Respiratory: Normal respiratory effort, no increased work of breathing. Neurologic: Grossly intact, no focal deficits, moving all 4 extremities. Psychiatric: Normal mood and affect.  Laboratory Data: No recent labs available  Pertinent Imaging: No recent imaging available   Assessment & Plan:    1. BPH with LUTS Continue conservative management, avoiding bladder irritants and timed voiding's Most bothersome symptoms is/are nocturia Continue tamsulosin 0.4 mg daily, finasteride 5 mg daily and Myrbetriq 50 mg daily- patient's daughter will come with him for his next visit, so we can hopefully reduce his medications RTC for monthly maintenance PTNS  2. Nocturia  Patient and staff at [facility] state that this has improved with PTNS. We will schedule him for monthly PTNS treatments.   [facility] [address] [place], [place] [phone]  I, [phone] [place], am acting as a Francina Ames for Neurosurgeon, [employer]. I have reviewed the above documentation for accuracy and completeness, and I agree with the above.    [employer], PA-C

## 2019-12-30 ENCOUNTER — Encounter: Payer: Self-pay | Admitting: Urology

## 2019-12-30 ENCOUNTER — Ambulatory Visit: Payer: Self-pay

## 2020-01-13 ENCOUNTER — Emergency Department: Payer: Medicare Other

## 2020-01-13 ENCOUNTER — Other Ambulatory Visit: Payer: Self-pay

## 2020-01-13 ENCOUNTER — Inpatient Hospital Stay
Admission: EM | Admit: 2020-01-13 | Discharge: 2020-01-18 | DRG: 689 | Disposition: A | Discharge status: Skilled Nursing Facility | Payer: Medicare Other | Source: Skilled Nursing Facility | Attending: Student | Admitting: Student

## 2020-01-13 ENCOUNTER — Encounter: Payer: Self-pay | Admitting: Emergency Medicine

## 2020-01-13 DIAGNOSIS — I69351 Hemiplegia and hemiparesis following cerebral infarction affecting right dominant side: Secondary | ICD-10-CM

## 2020-01-13 DIAGNOSIS — Z794 Long term (current) use of insulin: Secondary | ICD-10-CM

## 2020-01-13 DIAGNOSIS — G9341 Metabolic encephalopathy: Secondary | ICD-10-CM | POA: Diagnosis not present

## 2020-01-13 DIAGNOSIS — M549 Dorsalgia, unspecified: Secondary | ICD-10-CM | POA: Diagnosis present

## 2020-01-13 DIAGNOSIS — E1129 Type 2 diabetes mellitus with other diabetic kidney complication: Secondary | ICD-10-CM | POA: Diagnosis present

## 2020-01-13 DIAGNOSIS — N3000 Acute cystitis without hematuria: Secondary | ICD-10-CM

## 2020-01-13 DIAGNOSIS — N390 Urinary tract infection, site not specified: Secondary | ICD-10-CM | POA: Diagnosis not present

## 2020-01-13 DIAGNOSIS — E785 Hyperlipidemia, unspecified: Secondary | ICD-10-CM | POA: Diagnosis present

## 2020-01-13 DIAGNOSIS — Z87891 Personal history of nicotine dependence: Secondary | ICD-10-CM

## 2020-01-13 DIAGNOSIS — Z79899 Other long term (current) drug therapy: Secondary | ICD-10-CM

## 2020-01-13 DIAGNOSIS — Z888 Allergy status to other drugs, medicaments and biological substances status: Secondary | ICD-10-CM

## 2020-01-13 DIAGNOSIS — I10 Essential (primary) hypertension: Secondary | ICD-10-CM | POA: Diagnosis present

## 2020-01-13 DIAGNOSIS — E1140 Type 2 diabetes mellitus with diabetic neuropathy, unspecified: Secondary | ICD-10-CM | POA: Diagnosis present

## 2020-01-13 DIAGNOSIS — G4733 Obstructive sleep apnea (adult) (pediatric): Secondary | ICD-10-CM | POA: Diagnosis present

## 2020-01-13 DIAGNOSIS — Z6836 Body mass index (BMI) 36.0-36.9, adult: Secondary | ICD-10-CM

## 2020-01-13 DIAGNOSIS — E669 Obesity, unspecified: Secondary | ICD-10-CM | POA: Diagnosis present

## 2020-01-13 DIAGNOSIS — N401 Enlarged prostate with lower urinary tract symptoms: Secondary | ICD-10-CM | POA: Diagnosis present

## 2020-01-13 DIAGNOSIS — M170 Bilateral primary osteoarthritis of knee: Secondary | ICD-10-CM | POA: Diagnosis present

## 2020-01-13 DIAGNOSIS — Z7982 Long term (current) use of aspirin: Secondary | ICD-10-CM

## 2020-01-13 DIAGNOSIS — I639 Cerebral infarction, unspecified: Secondary | ICD-10-CM

## 2020-01-13 DIAGNOSIS — K219 Gastro-esophageal reflux disease without esophagitis: Secondary | ICD-10-CM | POA: Diagnosis not present

## 2020-01-13 DIAGNOSIS — A419 Sepsis, unspecified organism: Secondary | ICD-10-CM | POA: Insufficient documentation

## 2020-01-13 DIAGNOSIS — N400 Enlarged prostate without lower urinary tract symptoms: Secondary | ICD-10-CM | POA: Diagnosis present

## 2020-01-13 DIAGNOSIS — R32 Unspecified urinary incontinence: Secondary | ICD-10-CM | POA: Diagnosis present

## 2020-01-13 DIAGNOSIS — Z833 Family history of diabetes mellitus: Secondary | ICD-10-CM

## 2020-01-13 DIAGNOSIS — Z20822 Contact with and (suspected) exposure to covid-19: Secondary | ICD-10-CM | POA: Diagnosis present

## 2020-01-13 DIAGNOSIS — G8929 Other chronic pain: Secondary | ICD-10-CM | POA: Diagnosis present

## 2020-01-13 DIAGNOSIS — F0390 Unspecified dementia without behavioral disturbance: Secondary | ICD-10-CM | POA: Diagnosis present

## 2020-01-13 DIAGNOSIS — Z96653 Presence of artificial knee joint, bilateral: Secondary | ICD-10-CM | POA: Diagnosis present

## 2020-01-13 DIAGNOSIS — R4182 Altered mental status, unspecified: Secondary | ICD-10-CM | POA: Diagnosis not present

## 2020-01-13 DIAGNOSIS — Z87440 Personal history of urinary (tract) infections: Secondary | ICD-10-CM

## 2020-01-13 DIAGNOSIS — Z7902 Long term (current) use of antithrombotics/antiplatelets: Secondary | ICD-10-CM

## 2020-01-13 LAB — URINALYSIS, COMPLETE (UACMP) WITH MICROSCOPIC
Bilirubin Urine: NEGATIVE
Glucose, UA: NEGATIVE mg/dL
Ketones, ur: NEGATIVE mg/dL
Nitrite: NEGATIVE
Protein, ur: 30 mg/dL — AB
Specific Gravity, Urine: 1.025 (ref 1.005–1.030)
WBC, UA: >50 WBC/hpf — ABNORMAL HIGH (ref 0–5)
pH: 6 (ref 5.0–8.0)

## 2020-01-13 LAB — LACTIC ACID, PLASMA
Lactic Acid, Venous: 1.1 mmol/L (ref 0.5–1.9)
Lactic Acid, Venous: 1.1 mmol/L (ref 0.5–1.9)
Lactic Acid, Venous: 1 mmol/L (ref 0.5–1.9)

## 2020-01-13 LAB — COMPREHENSIVE METABOLIC PANEL
ALT: 19 U/L (ref 0–44)
AST: 20 U/L (ref 15–41)
Albumin: 3.8 g/dL (ref 3.5–5.0)
Alkaline Phosphatase: 58 U/L (ref 38–126)
Anion gap: 7 (ref 5–15)
BUN: 19 mg/dL (ref 8–23)
CO2: 25 mmol/L (ref 22–32)
Calcium: 9.3 mg/dL (ref 8.9–10.3)
Chloride: 101 mmol/L (ref 98–111)
Creatinine, Ser: 0.87 mg/dL (ref 0.61–1.24)
GFR calc Af Amer: >60 mL/min (ref >60)
GFR calc non Af Amer: >60 mL/min (ref >60)
Glucose, Bld: 96 mg/dL (ref 70–99)
Potassium: 4.3 mmol/L (ref 3.5–5.1)
Sodium: 133 mmol/L — ABNORMAL LOW (ref 135–145)
Total Bilirubin: 0.8 mg/dL (ref 0.3–1.2)
Total Protein: 6.8 g/dL (ref 6.5–8.1)

## 2020-01-13 LAB — CBC
HCT: 38.6 % — ABNORMAL LOW (ref 39.0–52.0)
Hemoglobin: 13.3 g/dL (ref 13.0–17.0)
MCH: 30.4 pg (ref 26.0–34.0)
MCHC: 34.5 g/dL (ref 30.0–36.0)
MCV: 88.1 fL (ref 80.0–100.0)
Platelets: 212 10*3/uL (ref 150–400)
RBC: 4.38 MIL/uL (ref 4.22–5.81)
RDW: 12.9 % (ref 11.5–15.5)
WBC: 20.3 10*3/uL — ABNORMAL HIGH (ref 4.0–10.5)
nRBC: 0 % (ref 0.0–0.2)

## 2020-01-13 LAB — PROTIME-INR
INR: 1 (ref 0.8–1.2)
Prothrombin Time: 12.8 seconds (ref 11.4–15.2)

## 2020-01-13 LAB — SARS CORONAVIRUS 2 BY RT PCR (HOSPITAL ORDER, PERFORMED IN ~~LOC~~ HOSPITAL LAB): SARS Coronavirus 2: NEGATIVE

## 2020-01-13 LAB — GLUCOSE, CAPILLARY: Glucose-Capillary: 82 mg/dL (ref 70–99)

## 2020-01-13 MED ORDER — SODIUM CHLORIDE 0.9 % IV BOLUS (SEPSIS)
1000.0000 mL | Freq: Once | INTRAVENOUS | Status: AC
  Administered 2020-01-13: 1000 mL via INTRAVENOUS

## 2020-01-13 MED ORDER — MORPHINE SULFATE (PF) 2 MG/ML IV SOLN: 1.0000 mg | Freq: Once | INTRAVENOUS | Status: DC

## 2020-01-13 MED ORDER — ACETAMINOPHEN 650 MG RE SUPP
650.0000 mg | Freq: Once | RECTAL | Status: AC
  Administered 2020-01-13: 650 mg via RECTAL
  Filled 2020-01-13: qty 1

## 2020-01-13 MED ORDER — SODIUM CHLORIDE 0.9 % IV SOLN: INTRAVENOUS | Status: DC

## 2020-01-13 MED ORDER — GABAPENTIN 100 MG PO CAPS
100.0000 mg | ORAL_CAPSULE | Freq: Two times a day (BID) | ORAL | Status: DC
  Administered 2020-01-13 – 2020-01-18 (×10): 100 mg via ORAL
  Filled 2020-01-13 (×10): qty 1

## 2020-01-13 MED ORDER — DICLOFENAC SODIUM 1 % EX GEL
4.0000 g | Freq: Two times a day (BID) | CUTANEOUS | Status: DC
  Administered 2020-01-16 – 2020-01-17 (×4): 4 g via TOPICAL
  Filled 2020-01-13 (×2): qty 100

## 2020-01-13 MED ORDER — GUAIFENESIN-DM 100-10 MG/5ML PO SYRP: 5.0000 mL | ORAL_SOLUTION | Freq: Four times a day (QID) | ORAL | Status: DC | PRN

## 2020-01-13 MED ORDER — MORPHINE SULFATE (PF) 2 MG/ML IV SOLN: 2.0000 mg | Freq: Once | INTRAVENOUS | Status: AC

## 2020-01-13 MED ORDER — VITAMIN D 25 MCG (1000 UNIT) PO TABS
1000.0000 [IU] | ORAL_TABLET | Freq: Every day | ORAL | Status: DC
  Administered 2020-01-14 – 2020-01-18 (×5): 1000 [IU] via ORAL
  Filled 2020-01-13 (×5): qty 1

## 2020-01-13 MED ORDER — CLOPIDOGREL BISULFATE 75 MG PO TABS
75.0000 mg | ORAL_TABLET | Freq: Every day | ORAL | Status: DC
  Administered 2020-01-14 – 2020-01-18 (×5): 75 mg via ORAL
  Filled 2020-01-13 (×5): qty 1

## 2020-01-13 MED ORDER — VANCOMYCIN HCL IN DEXTROSE 1-5 GM/200ML-% IV SOLN
1000.0000 mg | Freq: Once | INTRAVENOUS | Status: AC
  Administered 2020-01-13: 1000 mg via INTRAVENOUS
  Filled 2020-01-13: qty 200

## 2020-01-13 MED ORDER — SODIUM CHLORIDE 0.9 % IV SOLN
2.0000 g | Freq: Once | INTRAVENOUS | Status: AC
  Administered 2020-01-13: 2 g via INTRAVENOUS
  Filled 2020-01-13: qty 2

## 2020-01-13 MED ORDER — TAMSULOSIN HCL 0.4 MG PO CAPS
0.4000 mg | ORAL_CAPSULE | Freq: Every day | ORAL | Status: DC
  Administered 2020-01-14 – 2020-01-18 (×5): 0.4 mg via ORAL
  Filled 2020-01-13 (×5): qty 1

## 2020-01-13 MED ORDER — ACETAMINOPHEN 500 MG PO TABS: 1000.0000 mg | ORAL_TABLET | Freq: Once | ORAL | Status: DC

## 2020-01-13 MED ORDER — FINASTERIDE 5 MG PO TABS
5.0000 mg | ORAL_TABLET | Freq: Every day | ORAL | Status: DC
  Administered 2020-01-14 – 2020-01-18 (×5): 5 mg via ORAL
  Filled 2020-01-13 (×5): qty 1

## 2020-01-13 MED ORDER — MIRABEGRON ER 50 MG PO TB24
50.0000 mg | ORAL_TABLET | Freq: Every day | ORAL | Status: DC
  Administered 2020-01-14 – 2020-01-18 (×5): 50 mg via ORAL
  Filled 2020-01-13 (×6): qty 1

## 2020-01-13 MED ORDER — SODIUM CHLORIDE 0.9% FLUSH: 3.0000 mL | Freq: Once | INTRAVENOUS | Status: DC

## 2020-01-13 MED ORDER — METRONIDAZOLE IN NACL 5-0.79 MG/ML-% IV SOLN
500.0000 mg | Freq: Three times a day (TID) | INTRAVENOUS | Status: DC
  Administered 2020-01-13 – 2020-01-16 (×9): 500 mg via INTRAVENOUS
  Filled 2020-01-13 (×11): qty 100

## 2020-01-13 MED ORDER — PANTOPRAZOLE SODIUM 40 MG PO TBEC
40.0000 mg | DELAYED_RELEASE_TABLET | Freq: Every day | ORAL | Status: DC
  Administered 2020-01-14 – 2020-01-18 (×5): 40 mg via ORAL
  Filled 2020-01-13 (×5): qty 1

## 2020-01-13 MED ORDER — SODIUM CHLORIDE 0.9 % IV BOLUS: 1000.0000 mL | Freq: Once | INTRAVENOUS | Status: DC

## 2020-01-13 MED ORDER — ONDANSETRON HCL 4 MG/2ML IJ SOLN: 4.0000 mg | Freq: Three times a day (TID) | INTRAMUSCULAR | Status: DC | PRN

## 2020-01-13 MED ORDER — ASPIRIN 81 MG PO CHEW
81.0000 mg | CHEWABLE_TABLET | Freq: Every day | ORAL | Status: DC
  Administered 2020-01-14 – 2020-01-18 (×5): 81 mg via ORAL
  Filled 2020-01-13 (×6): qty 1

## 2020-01-13 MED ORDER — ADULT MULTIVITAMIN LIQUID CH
5.0000 mL | Freq: Every day | ORAL | Status: DC
  Administered 2020-01-14 – 2020-01-18 (×5): 5 mL via ORAL
  Filled 2020-01-13 (×5): qty 5

## 2020-01-13 MED ORDER — ACETAMINOPHEN 325 MG PO TABS
650.0000 mg | ORAL_TABLET | Freq: Four times a day (QID) | ORAL | Status: DC | PRN
  Administered 2020-01-14 (×2): 650 mg via ORAL
  Filled 2020-01-13 (×2): qty 2

## 2020-01-13 MED ORDER — HALOPERIDOL LACTATE 5 MG/ML IJ SOLN
1.0000 mg | Freq: Once | INTRAMUSCULAR | Status: AC
  Administered 2020-01-13: 1 mg via INTRAVENOUS
  Filled 2020-01-13: qty 1

## 2020-01-13 MED ORDER — SODIUM CHLORIDE 0.9 % IV SOLN
2.0000 g | INTRAVENOUS | Status: DC
  Administered 2020-01-14 – 2020-01-16 (×3): 2 g via INTRAVENOUS
  Filled 2020-01-13 (×3): qty 2

## 2020-01-13 MED ORDER — INSULIN DETEMIR 100 UNIT/ML ~~LOC~~ SOLN
4.0000 [IU] | Freq: Every day | SUBCUTANEOUS | Status: DC
  Administered 2020-01-13 – 2020-01-17 (×5): 4 [IU] via SUBCUTANEOUS
  Filled 2020-01-13 (×6): qty 0.04

## 2020-01-13 MED ORDER — DIVALPROEX SODIUM 125 MG PO CSDR
125.0000 mg | DELAYED_RELEASE_CAPSULE | Freq: Every day | ORAL | Status: DC
  Administered 2020-01-14 – 2020-01-18 (×5): 125 mg via ORAL
  Filled 2020-01-13 (×7): qty 1

## 2020-01-13 MED ORDER — PHENAZOPYRIDINE HCL 200 MG PO TABS
200.0000 mg | ORAL_TABLET | ORAL | Status: AC
  Administered 2020-01-13: 200 mg via ORAL
  Filled 2020-01-13: qty 1

## 2020-01-13 MED ORDER — ENOXAPARIN SODIUM 40 MG/0.4ML ~~LOC~~ SOLN
40.0000 mg | SUBCUTANEOUS | Status: DC
  Administered 2020-01-13 – 2020-01-17 (×5): 40 mg via SUBCUTANEOUS
  Filled 2020-01-13 (×5): qty 0.4

## 2020-01-13 MED ORDER — PIPERACILLIN-TAZOBACTAM 3.375 G IVPB 30 MIN: 3.3750 g | Freq: Once | INTRAVENOUS | Status: DC

## 2020-01-13 MED ORDER — MELATONIN 5 MG PO TABS
10.0000 mg | ORAL_TABLET | Freq: Every day | ORAL | Status: DC
  Administered 2020-01-13 – 2020-01-17 (×5): 10 mg via ORAL
  Filled 2020-01-13 (×5): qty 2

## 2020-01-13 MED ORDER — INSULIN ASPART 100 UNIT/ML ~~LOC~~ SOLN
0.0000 [IU] | SUBCUTANEOUS | Status: DC
  Administered 2020-01-14: 1 [IU] via SUBCUTANEOUS
  Administered 2020-01-14: 2 [IU] via SUBCUTANEOUS
  Administered 2020-01-14: 1 [IU] via SUBCUTANEOUS
  Administered 2020-01-15: 2 [IU] via SUBCUTANEOUS
  Filled 2020-01-13 (×4): qty 1

## 2020-01-13 MED ORDER — SERTRALINE HCL 50 MG PO TABS
25.0000 mg | ORAL_TABLET | Freq: Every day | ORAL | Status: DC
  Administered 2020-01-14 – 2020-01-18 (×5): 25 mg via ORAL
  Filled 2020-01-13 (×4): qty 1
  Filled 2020-01-13: qty 0.5

## 2020-01-13 MED ORDER — ATORVASTATIN CALCIUM 20 MG PO TABS
40.0000 mg | ORAL_TABLET | Freq: Every day | ORAL | Status: DC
  Administered 2020-01-14 – 2020-01-18 (×5): 40 mg via ORAL
  Filled 2020-01-13 (×5): qty 2

## 2020-01-13 MED ORDER — MORPHINE SULFATE (PF) 2 MG/ML IV SOLN
INTRAVENOUS | Status: AC
  Administered 2020-01-13: 2 mg via INTRAVENOUS
  Filled 2020-01-13: qty 1

## 2020-01-13 MED ORDER — SODIUM CHLORIDE 0.9 % IV BOLUS (SEPSIS)
200.0000 mL | Freq: Once | INTRAVENOUS | Status: AC
  Administered 2020-01-13: 200 mL via INTRAVENOUS

## 2020-01-13 MED ORDER — INSULIN DETEMIR 100 UNIT/ML ~~LOC~~ SOLN
20.0000 [IU] | Freq: Every morning | SUBCUTANEOUS | Status: DC
  Administered 2020-01-15 – 2020-01-18 (×3): 20 [IU] via SUBCUTANEOUS
  Filled 2020-01-13 (×6): qty 0.2

## 2020-01-13 NOTE — Progress Notes (Signed)
Cross Cover Brief Note Review of admission. New onset atriall fibrillation noted on flow sheet with RVR 115 accompanied by elevated respiratory rate.  Bedside EKG SR with abnormal R wave progression. Patient denies chest pain. Remains restless despite haldol given.  Nurse reports patient also continues to pull on foley catheter.  Wife reports patient continent until 2 months ago.  She does not know history of abnormal heart rhythm but patient with history of CVA.  Also noted he is on flomax and proscar. Wife unaware of LUTS or BPH.    Patient now with sepsis criteria.  Will repeat lactate noting elevated RR and HR. BP stable. However more likely related to his pain at the time  Patient very restless. Repoorts right hip pain and claims a fall 2 months ago. Reliability could be questionable. Wife canot confirm fall. Patient is able to move hip. Also indicates continued feeling of having to urinate and urinary tract pain. With distress severity, medicated with morphine and pyridium scheduled to start. Good effect from morphine noted. Patient stable to transfer to floor with cardiac monitoring to assess for PAF

## 2020-01-13 NOTE — Progress Notes (Signed)
PHARMACY -  BRIEF ANTIBIOTIC NOTE   Pharmacy has received consult(s) for Vancomycin and Cefepime from an ED provider.  The patient's profile has been reviewed for ht/wt/allergies/indication/available labs.    One time order(s) placed for Cefepime 2g IV and Vancomycin 1g IV x2 (for total loading dose of 2g)  Further antibiotics/pharmacy consults should be ordered by admitting physician if indicated.                       Thank you,  Gardner Candle, PharmD, BCPS Clinical Pharmacist 01/13/2020 1:08 PM

## 2020-01-13 NOTE — H&P (Signed)
History and Physical    David Sloan ANV:[id number] DOB: [birth year] DOA: 01/13/2020  Referring MD/NP/PA:   PCP: [facility]   Patient coming from:  The patient is coming from SNF.  At baseline, pt is dependent for most of ADL.        Chief Complaint: AMS  HPI: David Sloan is a 79 y.o. male with medical history significant of hypertension, hyperlipidemia, diabetes mellitus, stroke with right-sided weakness,, GERD, OSA, BPH, chronic pain, who presents with altered mental status.  Per her daughter, she saw her father last night when pt was at normal baseline health condition.  This morning, patient was noted to be confused by staff in the nursing home.  Patient has dry cough, congestion and keeps clearing his throat.  No chest pain, fever or chills.  Patient does not have nausea vomiting, diarrhea, abdominal pain.  Patient had positive urinalysis test yesterday in the facility.  ED Course: pt was found to have positive urinalysis (cloudy appearance, large amount of leukocyte, many bacteria, WBC> 50), negative COVID-19 PCR, electrolytes renal function okay, WBC 20.3, lactic acid 1.1, INR 1.0, temperature normal, blood pressure 98/58, heart rate 69, RR 20.  Chest x-ray negative.  CT head negative for acute intracranial abnormalities patient is placed on MedSurg [facility] for observation  Review of Systems: Could not reviewed accurately due to altered mental status  Allergy:  Allergies  Allergen Reactions  . Losartan Potassium Swelling  . Tamsulosin Hcl Swelling    Past Medical History:  Diagnosis Date  . Atypical chest pain    a. 04/2013 MV: nl EF, no ischemia.  Marland Kitchen BPH (benign prostatic hyperplasia)   . Chronic back pain   . Diabetes mellitus without complication (HCC)   . Diabetic neuropathy (HCC)   . Essential hypertension   . Falls   . GERD (gastroesophageal reflux disease)   . Memory disorder   . OSA (obstructive sleep apnea)   . Osteoarthritis    a. s/p bilat  knee surgeries.  . Sinus bradycardia   . Stroke [facility])    a. ~ 2002 - no residual deficits;  b. 03/2015 Head CT: prior old infarcts and foci of small vesel dzs in pons bilat. ? bilat occipital lobe acute infarcts R>L.  Marland Kitchen Urinary incontinence     Past Surgical History:  Procedure Laterality Date  . JOINT REPLACEMENT     bilateral knee replacements    Social History:  reports that he has quit smoking. His smoking use included cigarettes. He has a 25.00 pack-year smoking history. He has never used smokeless tobacco. He reports that he does not drink alcohol and does not use drugs.  Family History:  Family History  Problem Relation Age of Onset  . Diabetes Mother   . Alzheimer's disease Father      Prior to Admission medications   Medication Sig Start Date End Date Taking? Authorizing Provider  amLODipine (NORVASC) 5 MG tablet Take 5 mg by mouth daily. 06/23/19   [provider]  aspirin 81 MG chewable tablet Chew 1 tablet (81 mg total) by mouth daily. 10/04/15   Alford Highland, MD  atorvastatin (LIPITOR) 40 MG tablet Take 40 mg by mouth daily.    [provider]  cholecalciferol (VITAMIN D) 1000 units tablet Take 1,000 Units by mouth daily.    [provider]  clopidogrel (PLAVIX) 75 MG tablet Take 75 mg by mouth daily.    [provider]  diphenhydrAMINE (BENADRYL) 25 mg capsule Take  1 capsule (25 mg total) by mouth every 6 (six) hours as needed for up to 3 days for itching or allergies. 04/24/18 04/27/18  Shaune Pollackhen, Qing, MD  famotidine (PEPCID) 20 MG tablet Take 1 tablet (20 mg total) by mouth 2 (two) times daily for 3 days. 04/24/18 04/27/18  Shaune Pollackhen, Qing, MD  fesoterodine (TOVIAZ) 4 MG TB24 tablet Take 1 tablet (4 mg total) by mouth daily. 06/22/19   Michiel CowboyMcGowan, Shannon A, PA-C  finasteride (PROSCAR) 5 MG tablet Take 5 mg by mouth daily. 06/27/19   [provider]  gabapentin (NEURONTIN) 100 MG capsule  06/30/19   [provider]   guaiFENesin-dextromethorphan (ROBITUSSIN DM) 100-10 MG/5ML syrup Take 5 mLs by mouth every 4 (four) hours as needed for cough (chest congestion). 04/24/18   Shaune Pollackhen, Qing, MD  insulin NPH Human (HUMULIN N) 100 UNIT/ML injection 32 units before breakfast and 24 units before supper. Patient taking differently: Inject 20 Units into the skin 2 (two) times daily.  10/04/15   Alford HighlandWieting, Richard, MD  metFORMIN (GLUCOPHAGE-XR) 500 MG 24 hr tablet Take 500 mg by mouth 2 (two) times daily. 07/18/19   [provider]  mirabegron ER (MYRBETRIQ) 50 MG TB24 tablet Take 1 tablet (50 mg total) by mouth daily. 03/17/19   Sondra ComeSninsky, Brian C, MD  pantoprazole (PROTONIX) 20 MG tablet Take 20 mg by mouth daily. 06/28/19   [provider]  tamsulosin (FLOMAX) 0.4 MG CAPS capsule Take 0.4 mg by mouth daily.    [provider]    Physical Exam: Vitals:   01/13/20 1411 01/13/20 1433 01/13/20 1455 01/13/20 1800  BP: (!) 127/63 117/82  (!) 140/91  Pulse:  69    Resp: 14 20  19   Temp:   97.6 F (36.4 C)   TempSrc:   Axillary   SpO2:  100%    Weight:      Height:       General: Not in acute distress HEENT:       Eyes: PERRL, EOMI, no scleral icterus.       ENT: No discharge from the ears and nose, no pharynx injection, no tonsillar enlargement.        Neck: No JVD, no bruit, no mass felt. Heme: No neck lymph node enlargement. Cardiac: S1/S2, RRR, No murmurs, No gallops or rubs. Respiratory: No rales, wheezing, rhonchi or rubs. GI: Soft, nondistended, nontender, no rebound pain, no organomegaly, BS present. GU: No hematuria Ext: has trace leg edema bilaterally. 1+DP/PT pulse bilaterally. Musculoskeletal: No joint deformities, No joint redness or warmth, no limitation of ROM in spin. Skin: No rashes.  Neuro: confused, orientated to the place and knows his own name, not oriented to time.  Cranial nerves II-XII grossly intact,  Psych: Patient is not psychotic, no suicidal or hemocidal  ideation.  Labs on Admission: I have personally reviewed following labs and imaging studies  CBC: Recent Labs  Lab 01/13/20 1149  WBC 20.3*  HGB 13.3  HCT 38.6*  MCV 88.1  PLT 212   Basic Metabolic Panel: Recent Labs  Lab 01/13/20 1149  NA 133*  K 4.3  CL 101  CO2 25  GLUCOSE 96  BUN 19  CREATININE 0.87  CALCIUM 9.3   GFR: Estimated Creatinine Clearance: 83 mL/min (by C-G formula based on SCr of 0.87 mg/dL). Liver Function Tests: Recent Labs  Lab 01/13/20 1149  AST 20  ALT 19  ALKPHOS 58  BILITOT 0.8  PROT 6.8  ALBUMIN 3.8   No results  for input(s): LIPASE, AMYLASE in the last 168 hours. No results for input(s): AMMONIA in the last 168 hours. Coagulation Profile: Recent Labs  Lab 01/13/20 1424  INR 1.0   Cardiac Enzymes: No results for input(s): CKTOTAL, CKMB, CKMBINDEX, TROPONINI in the last 168 hours. BNP (last 3 results) No results for input(s): PROBNP in the last 8760 hours. HbA1C: No results for input(s): HGBA1C in the last 72 hours. CBG: Recent Labs  Lab 01/13/20 1158  GLUCAP 82   Lipid Profile: No results for input(s): CHOL, HDL, LDLCALC, TRIG, CHOLHDL, LDLDIRECT in the last 72 hours. Thyroid Function Tests: No results for input(s): TSH, T4TOTAL, FREET4, T3FREE, THYROIDAB in the last 72 hours. Anemia Panel: No results for input(s): VITAMINB12, FOLATE, FERRITIN, TIBC, IRON, RETICCTPCT in the last 72 hours. Urine analysis:    Component Value Date/Time   COLORURINE YELLOW (A) 01/13/2020 1500   APPEARANCEUR CLOUDY (A) 01/13/2020 1500   APPEARANCEUR Clear 12/07/2018 1323   LABSPEC 1.025 01/13/2020 1500   PHURINE 6.0 01/13/2020 1500   GLUCOSEU NEGATIVE 01/13/2020 1500   HGBUR SMALL (A) 01/13/2020 1500   BILIRUBINUR NEGATIVE 01/13/2020 1500   BILIRUBINUR Negative 12/07/2018 1323   KETONESUR NEGATIVE 01/13/2020 1500   PROTEINUR 30 (A) 01/13/2020 1500   NITRITE NEGATIVE 01/13/2020 1500   LEUKOCYTESUR LARGE (A) 01/13/2020 1500   Sepsis  Labs: @LABRCNTIP (procalcitonin:4,lacticidven:4) ) Recent Results (from the past 240 hour(s))  SARS Coronavirus 2 by RT PCR (hospital order, performed in [facility] hospital lab) Nasopharyngeal Nasopharyngeal Swab     Status: None   Collection Time: 01/13/20  1:12 PM   Specimen: Nasopharyngeal Swab  Result Value Ref Range Status   SARS Coronavirus 2 NEGATIVE NEGATIVE Final    Comment: (NOTE) SARS-CoV-2 target nucleic acids are NOT DETECTED.  The SARS-CoV-2 RNA is generally detectable in upper and lower respiratory specimens during the acute phase of infection. The lowest concentration of SARS-CoV-2 viral copies this assay can detect is 250 copies / mL. A negative result does not preclude SARS-CoV-2 infection and should not be used as the sole basis for treatment or other patient management decisions.  A negative result may occur with improper specimen collection / handling, submission of specimen other than nasopharyngeal swab, presence of viral mutation(s) within the areas targeted by this assay, and inadequate number of viral copies (<250 copies / mL). A negative result must be combined with clinical observations, patient history, and epidemiological information.  Fact Sheet for Patients:   01/15/20  Fact Sheet for Healthcare Providers: BoilerBrush.com.cy  This test is not yet approved or  cleared by the https://pope.com/ FDA and has been authorized for detection and/or diagnosis of SARS-CoV-2 by FDA under an Emergency Use Authorization (EUA).  This EUA will remain in effect (meaning this test can be used) for the duration of the COVID-19 declaration under Section 564(b)(1) of the Act, 21 U.S.C. section 360bbb-3(b)(1), unless the authorization is terminated or revoked sooner.  Performed at [facility], [address] [place], [place]      Radiological Exams on Admission: CT Head Wo  Contrast  Result Date: 01/13/2020 CLINICAL DATA:  Mental status change. EXAM: CT HEAD WITHOUT CONTRAST TECHNIQUE: Contiguous axial images were obtained from the base of the skull through the vertex without intravenous contrast. COMPARISON:  02/15/2018 head CT and MRI. FINDINGS: Brain: Mild diffuse parenchymal volume loss with ex vacuo dilatation. Sequela of remote left frontal and left occipital lobe insults. Sequela of remote right centrum semiovale and left thalamic insults.  Background scattered and confluent hypodense foci involving the periventricular and subcortical white matter are grossly unchanged. These are nonspecific however commonly associated with chronic microvascular ischemic changes. No new focal hypodensity to suggest acute infarct. No intracranial hemorrhage. No midline shift, mass lesion or extra-axial fluid collection. Vascular: No hyperdense vessel. Bilateral carotid siphon and V4 segment atherosclerotic calcifications. Skull: No acute osseous abnormality. Sinuses/Orbits: No acute orbital finding. Mild ethmoid and left maxillary sinus mucosal thickening. No mastoid effusion. Other: None. IMPRESSION: No acute intracranial process. Sequela of remote insults involving the right centrum semiovale, left frontal and occipital regions and left thalamus. Mild cerebral atrophy. Advanced chronic microvascular ischemic changes. Electronically Signed   By: Stana Bunting M.D.   On: 01/13/2020 13:07   DG Chest Portable 1 View  Result Date: 01/13/2020 CLINICAL DATA:  Sepsis EXAM: PORTABLE CHEST 1 VIEW COMPARISON:  2019 FINDINGS: The heart size and mediastinal contours are within normal limits. Low lung volumes. No new consolidation or edema. No pleural effusion. IMPRESSION: No acute process in the chest. Electronically Signed   By: Guadlupe Spanish M.D.   On: 01/13/2020 14:09     EKG: Independently reviewed.  Sinus rhythm, QTC 422, LAD, nonspecific to change  Assessment/Plan Principal  Problem:   UTI (urinary tract infection) Active Problems:   GERD (gastroesophageal reflux disease)   Essential (primary) hypertension   BPH (benign prostatic hyperplasia)   Stroke (HCC)   Acute metabolic encephalopathy   HLD (hyperlipidemia)   Type II diabetes mellitus with renal manifestations (HCC)   UTI (urinary tract infection): Patient has leukocytosis with WBC 20.3,  no fever.  No tachycardia or tachypnea.  Blood pressure soft, does not meet criteria for sepsis, but he is at risk of developing sepsis.  -Placed on MedSurg bed for observation -IV Rocephin (patient received 1 dose of vancomycin, Flagyl and cefepime by ED) -Follow-up blood culture and urine culture -IV fluid: Patient received 2.2 L of normal saline in ED, will continue 100 cc/h  GERD (gastroesophageal reflux disease) -Protonix  Essential (primary) hypertension -Hold blood pressure medications due to sepsis and soft blood pressure  Stroke (HCC) -Aspirin, Plavix, Lipitor  Acute metabolic encephalopathy: CT head negative.  Most likely due to UTI -Frequent neuro check  HLD (hyperlipidemia) -Lipitor  Type II diabetes mellitus with renal manifestations (HCC) -Decrease NPH insulin dose from 38-6 units 20-4 units twice daily -Sliding scale insulin  BPH -Proscar and Flomax         DVT ppx:  SQ Lovenox Code Status: Full code per his daughter Family Communication:   Yes, patient's daughter by phone at bed side Disposition Plan:  Anticipate discharge back to previous SNF environment Consults called:  None Admission status: Med-surg bed for obs     Status is: Observation  The patient remains OBS appropriate and will d/c before 2 midnights.  Dispo: The patient is from: SNF              Anticipated d/c is to: SNF              Anticipated d/c date is: 1 day              Patient currently is not medically stable to d/c.           Date of Service 01/13/2020    Lorretta Harp [facility]   If 7PM-7AM, please contact night-coverage [email or web address] 01/13/2020, 6:45 PM

## 2020-01-13 NOTE — Progress Notes (Signed)
CODE SEPSIS - PHARMACY COMMUNICATION  **Broad Spectrum Antibiotics should be administered within 1 hour of Sepsis diagnosis**  Time Code Sepsis Called/Page Received: 12:57  Antibiotics Ordered: Cefepime, Vancomycin  Time of 1st antibiotic administration: Cefepime given at 13:32  Additional action taken by pharmacy:    If necessary, Name of Provider/Nurse Contacted:      Foye Deer ,PharmD Clinical Pharmacist  01/13/2020  1:33 PM

## 2020-01-13 NOTE — ED Provider Notes (Signed)
[facility] Emergency Department Provider Note  ____________________________________________   First MD Initiated Contact with Patient 01/13/20 1238     (approximate)  I have reviewed the triage vital signs and the nursing notes.   HISTORY  Chief Complaint No chief complaint on file.    HPI David Sloan is a 79 y.o. male  With PMHx HTN, DM, CVA, here with altered mental status. History is limited 2/2 confusion. Per report, pt was last spoken to by family, who works here, last night and he was acting, talking normally. He has a h/o recurrent UTIs and was reportedly diagnosed with UTI yesterday and started on ABX. Throughout the day today, he has been increasingly confused and fatigued. He no longer recognizes his daughter and is unsure where he is. This is all new. No focal weakness or numbness. Unable to provide additional history.  Level 5 caveat invoked as remainder of history, ROS, and physical exam limited due to patient's AMS.        Past Medical History:  Diagnosis Date  . Atypical chest pain    a. 04/2013 MV: nl EF, no ischemia.  Marland Kitchen. BPH (benign prostatic hyperplasia)   . Chronic back pain   . Diabetes mellitus without complication (HCC)   . Diabetic neuropathy (HCC)   . Essential hypertension   . Falls   . GERD (gastroesophageal reflux disease)   . Memory disorder   . OSA (obstructive sleep apnea)   . Osteoarthritis    a. s/p bilat knee surgeries.  . Sinus bradycardia   . Stroke [facility](HCC)    a. ~ 2002 - no residual deficits;  b. 03/2015 Head CT: prior old infarcts and foci of small vesel dzs in pons bilat. ? bilat occipital lobe acute infarcts R>L.  Marland Kitchen. Urinary incontinence     Patient Active Problem List   Diagnosis Date Noted  . UTI (urinary tract infection) 01/13/2020  . Acute metabolic encephalopathy 01/13/2020  . HLD (hyperlipidemia) 01/13/2020  . Type II diabetes mellitus with renal manifestations (HCC) 01/13/2020  . Sepsis (HCC)  01/13/2020  . Angioedema 04/22/2018  . Long term current use of opiate analgesic 03/04/2018  . Nontraumatic intracerebral hemorrhage (HCC) 02/09/2018  . Chronic pain syndrome 09/23/2017  . Hyperlipidemia, mixed 06/24/2017  . Benign prostatic hyperplasia without lower urinary tract symptoms 06/24/2017  . History of CVA (cerebrovascular accident) 06/24/2017  . Primary open angle glaucoma (POAG) of both eyes 06/24/2017  . Controlled type 2 diabetes mellitus without complication, with long-term current use of insulin (HCC) 06/24/2017  . 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 Pain of left femur 09/29/2016  . Pain medication agreement signed 08/05/2016  . Fall 10/03/2015  . Gait instability 10/03/2015  . TIA (transient ischemic attack) 10/03/2015  . Acute CVA (cerebrovascular accident) (HCC) 10/03/2015  . CVA (cerebral infarction) 10/03/2015  . Risk for falls 07/30/2015  . S/P cholecystectomy 07/30/2015  . Diabetic neuropathy (HCC) 07/17/2015  . Scoliosis 04/13/2015  . Fibrositis 04/13/2015  . Slurred speech 03/31/2015  . Elevated transaminase level 03/31/2015  . Gallstones 03/31/2015  . Lower extremity weakness 03/30/2015  . Cerebral infarction (HCC)   . Elevated troponin   . Uncontrolled type 2 diabetes mellitus with circulatory disorder (HCC)   . EKG abnormalities   . Emesis   . Gall bladder disease   . Other long term (current) drug therapy 02/13/2015  . Essential (primary) hypertension 09/18/2014  . Right sciatic nerve pain 09/15/2014  . Bradycardia 09/13/2014  .  Cognitive disorder 04/05/2014  . Chronic pain associated with significant psychosocial dysfunction 01/17/2014  . Back ache 01/17/2014  . Esophageal stenosis 11/30/2013  . Can't get food down 11/30/2013  . Esophageal stricture 11/30/2013  . Chronic, continuous use of opioids 07/06/2013  . Cerebrovascular accident (CVA) (HCC) 05/19/2013  . Hypothyroid 05/19/2013  . GERD (gastroesophageal reflux disease)  05/19/2013  . Stroke (HCC) 05/19/2013  . Gastroesophageal reflux disease without esophagitis 05/19/2013  . Spondylosis of lumbar region without myelopathy or radiculopathy 01/11/2013  . SI (sacroiliac) pain 10/06/2012  . Osteoarthritis 10/06/2012  . Obstructive sleep apnea 10/06/2012  . Lumbar degenerative disc disease 10/06/2012  . Facet arthritis of lumbar region 10/06/2012  . Diabetes mellitus (HCC) 10/06/2012  . Diabetes (HCC) 10/06/2012  . Punctate keratitis 01/21/2012  . 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HCC) 01/21/2012  . Type 2 diabetes mellitus (HCC) 10/16/2010  . Tear film insufficiency 10/16/2010  . Senile corneal changes 10/16/2010  . Primary open-angle glaucoma 10/16/2010  . Moderate stage glaucoma 10/16/2010  . Background diabetic retinopathy (HCC) 10/16/2010  . 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 JOINT REPLACEMENT     bilateral knee replacements    Prior to Admission medications   Medication Sig Start Date End Date Taking? Authorizing Provider  amLODipine (NORVASC) 5 MG tablet Take 5 mg by mouth daily. 06/23/19  Yes [provider]  aspirin 81 MG chewable tablet Chew 1 tablet (81 mg total) by mouth daily. 10/04/15  Yes Wieting, Richard, MD  atorvastatin (LIPITOR) 40 MG tablet Take 40 mg by mouth daily.   Yes [provider]  cholecalciferol (VITAMIN D) 1000 units tablet Take 1,000 Units by mouth daily.   Yes [provider]  clopidogrel (PLAVIX) 75 MG tablet Take 75 mg by mouth daily.   Yes [provider]  diclofenac Sodium (VOLTAREN) 1 % GEL Apply 4 g topically 2 (two) times daily.   Yes [provider]  divalproex (DEPAKOTE SPRINKLE) 125 MG capsule Take 125 mg by mouth daily.   Yes [provider]  finasteride (PROSCAR) 5 MG tablet Take 5 mg by mouth daily. 06/27/19  Yes [provider]  gabapentin (NEURONTIN) 100 MG capsule  Take 100 mg by mouth 2 (two) times daily.  06/30/19  Yes [provider]  insulin NPH Human (HUMULIN N) 100 UNIT/ML injection 32 units before breakfast and 24 units before supper. Patient taking differently: Inject 6-38 Units into the skin 2 (two) times daily. 38 units daily before breakfast and 6 units at 1900 (Hold if Fasting Blood Sugar is less than 90) 10/04/15  Yes Wieting, Richard, MD  linagliptin (TRADJENTA) 5 MG TABS tablet Take 5 mg by mouth daily.   Yes [provider]  melatonin 3 MG TABS tablet Take 9 mg by mouth at bedtime.   Yes [provider]  metFORMIN (GLUCOPHAGE-XR) 500 MG 24 hr tablet Take 500 mg by mouth daily with breakfast.  07/18/19  Yes [provider]  mirabegron ER (MYRBETRIQ) 50 MG TB24 tablet Take 1 tablet (50 mg total) by mouth daily. 03/17/19  Yes Sondra Come, MD  Multiple Vitamin (MULTIVITAMIN) LIQD Take 5 mLs by mouth daily.   Yes [provider]  pantoprazole (PROTONIX) 40 MG tablet Take 40 mg by mouth daily.  06/28/19  Yes [provider]  sertraline (ZOLOFT) 25 MG tablet Take 25 mg by mouth daily.   Yes [provider]  tamsulosin (  FLOMAX) 0.4 MG CAPS capsule Take 0.4 mg by mouth daily.   Yes [provider]  diphenhydrAMINE (BENADRYL) 25 mg capsule Take 1 capsule (25 mg total) by mouth every 6 (six) hours as needed for up to 3 days for itching or allergies. 04/24/18 04/27/18  Shaune Pollack, MD  famotidine (PEPCID) 20 MG tablet Take 1 tablet (20 mg total) by mouth 2 (two) times daily for 3 days. 04/24/18 04/27/18  Shaune Pollack, MD  fesoterodine (TOVIAZ) 4 MG TB24 tablet Take 1 tablet (4 mg total) by mouth daily. Patient not taking: Reported on 01/13/2020 06/22/19   Michiel Cowboy A, PA-C  guaiFENesin-dextromethorphan (ROBITUSSIN DM) 100-10 MG/5ML syrup Take 5 mLs by mouth every 4 (four) hours as needed for cough (chest congestion). Patient not taking: Reported on 01/13/2020 04/24/18   Shaune Pollack, MD     Allergies Losartan potassium and Tamsulosin hcl  Family History  Problem Relation Age of Onset  . Diabetes Mother   . Alzheimer's disease Father     Social History Social History   Tobacco Use  . Smoking status: Former Smoker    Packs/day: 1.00    Years: 25.00    Pack years: 25.00    Types: Cigarettes  . Smokeless tobacco: Never Used  Vaping Use  . Vaping Use: Never used  Substance Use Topics  . Alcohol use: No  . Drug use: No    Review of Systems  Review of Systems  Unable to perform ROS: Mental status change     ____________________________________________  PHYSICAL EXAM:      VITAL SIGNS: ED Triage Vitals  Enc Vitals Group     BP 01/13/20 1102 (!) 98/58     Pulse Rate 01/13/20 1102 82     Resp 01/13/20 1102 16     Temp 01/13/20 1102 99.1 F (37.3 C)     Temp Source 01/13/20 1102 Oral     SpO2 01/13/20 1102 99 %     Weight 01/13/20 1058 (!) 235 lb 14.3 oz (107 kg)     Height 01/13/20 1058 5\' 7"  (1.702 m)     Head Circumference --      [facility]ak Flow --      Pain Score 01/13/20 1058 0     Pain Loc --      Pain Edu? --      Excl. in GC? --      Physical Exam Vitals and nursing note reviewed.  Constitutional:      General: He is not in acute distress.    Appearance: He is well-developed.  HENT:     Head: Normocephalic and atraumatic.     Mouth/Throat:     Mouth: Mucous membranes are dry.  Eyes:     Conjunctiva/sclera: Conjunctivae normal.  Cardiovascular:     Rate and Rhythm: Normal rate and regular rhythm.     Heart sounds: Normal heart sounds. No murmur heard.  No friction rub.  Pulmonary:     Effort: Pulmonary effort is normal. No respiratory distress.     Breath sounds: Normal breath sounds. No wheezing or rales.  Abdominal:     General: There is no distension.     Palpations: Abdomen is soft.     Tenderness: There is no abdominal tenderness.  Musculoskeletal:     Cervical back: Neck supple.  Skin:    General: Skin is warm.      Capillary Refill: Capillary refill takes less than 2 seconds.  Neurological:     Mental  Status: He is alert. He is disoriented.     Motor: No abnormal muscle tone.       ____________________________________________   LABS (all labs ordered are listed, but only abnormal results are displayed)  Labs Reviewed  COMPREHENSIVE METABOLIC PANEL - Abnormal; Notable for the following components:      Result Value   Sodium 133 (*)    All other components within normal limits  CBC - Abnormal; Notable for the following components:   WBC 20.3 (*)    HCT 38.6 (*)    All other components within normal limits  URINALYSIS, COMPLETE (UACMP) WITH MICROSCOPIC - Abnormal; Notable for the following components:   Color, Urine YELLOW (*)    APPearance CLOUDY (*)    Hgb urine dipstick SMALL (*)    Protein, ur 30 (*)    Leukocytes,Ua LARGE (*)    WBC, UA >50 (*)    Bacteria, UA MANY (*)    All other components within normal limits  SARS CORONAVIRUS 2 BY RT PCR (HOSPITAL ORDER, PERFORMED IN [facility])  CULTURE, BLOOD (ROUTINE X 2)  CULTURE, BLOOD (ROUTINE X 2)  URINE CULTURE  GLUCOSE, CAPILLARY  LACTIC ACID, PLASMA  PROTIME-INR  CBG MONITORING, ED    ____________________________________________  EKG: Normal sinus rhythm, VR 80. PR 170, QRS 86, QTc 422. No acute st elevations or depressions. No ischemia or infarct. ________________________________________  RADIOLOGY All imaging, including plain films, CT scans, and ultrasounds, independently reviewed by me, and interpretations confirmed via formal radiology reads.  ED MD interpretation:   CXR: Clear  Official radiology report(s): CT Head Wo Contrast  Result Date: 01/13/2020 CLINICAL DATA:  Mental status change. EXAM: CT HEAD WITHOUT CONTRAST TECHNIQUE: Contiguous axial images were obtained from the base of the skull through the vertex without intravenous contrast. COMPARISON:  02/15/2018 head CT and MRI. FINDINGS: Brain: Mild  diffuse parenchymal volume loss with ex vacuo dilatation. Sequela of remote left frontal and left occipital lobe insults. Sequela of remote right centrum semiovale and left thalamic insults. Background scattered and confluent hypodense foci involving the periventricular and subcortical white matter are grossly unchanged. These are nonspecific however commonly associated with chronic microvascular ischemic changes. No new focal hypodensity to suggest acute infarct. No intracranial hemorrhage. No midline shift, mass lesion or extra-axial fluid collection. Vascular: No hyperdense vessel. Bilateral carotid siphon and V4 segment atherosclerotic calcifications. Skull: No acute osseous abnormality. Sinuses/Orbits: No acute orbital finding. Mild ethmoid and left maxillary sinus mucosal thickening. No mastoid effusion. Other: None. IMPRESSION: No acute intracranial process. Sequela of remote insults involving the right centrum semiovale, left frontal and occipital regions and left thalamus. Mild cerebral atrophy. Advanced chronic microvascular ischemic changes. Electronically Signed   By: Stana Bunting M.D.   On: 01/13/2020 13:07   DG Chest Portable 1 View  Result Date: 01/13/2020 CLINICAL DATA:  Sepsis EXAM: PORTABLE CHEST 1 VIEW COMPARISON:  2019 FINDINGS: The heart size and mediastinal contours are within normal limits. Low lung volumes. No new consolidation or edema. No pleural effusion. IMPRESSION: No acute process in the chest. Electronically Signed   By: Guadlupe Spanish M.D.   On: 01/13/2020 14:09    ____________________________________________  PROCEDURES   Procedure(s) performed (including Critical Care):  .Critical Care Performed by: Shaune Pollack, MD Authorized by: Shaune Pollack, MD   Critical care provider statement:    Critical care time (minutes):  35   Critical care time was exclusive of:  Separately billable procedures and treating other patients  and teaching time   Critical care  was necessary to treat or prevent imminent or life-threatening deterioration of the following conditions:  Cardiac failure, circulatory failure, respiratory failure and sepsis   Critical care was time spent personally by me on the following activities:  Development of treatment plan with patient or surrogate, discussions with consultants, evaluation of patient's response to treatment, examination of patient, obtaining history from patient or surrogate, ordering and performing treatments and interventions, ordering and review of laboratory studies, ordering and review of radiographic studies, pulse oximetry, re-evaluation of patient's condition and review of old charts   I assumed direction of critical care for this patient from another provider in my specialty: no   .1-3 Lead EKG Interpretation Performed by: Shaune Pollack, MD Authorized by: Shaune Pollack, MD     Interpretation: normal     ECG rate:  60-70s   ECG rate assessment: normal     Rhythm: sinus rhythm     Ectopy: none     Conduction: normal   Comments:     Indication: Sepsis    ____________________________________________  INITIAL IMPRESSION / MDM / ASSESSMENT AND PLAN / ED COURSE  As part of my medical decision making, I reviewed the following data within the electronic MEDICAL RECORD NUMBER Nursing notes reviewed and incorporated, Old chart reviewed, Notes from prior ED visits, and [place] Controlled Substance Database       *TINA TEMME was evaluated in Emergency Department on 01/13/2020 for the symptoms described in the history of present illness. He was evaluated in the context of the global COVID-19 pandemic, which necessitated consideration that the patient might be at risk for infection with the SARS-CoV-2 virus that causes COVID-19. Institutional protocols and algorithms that pertain to the evaluation of patients at risk for COVID-19 are in a state of rapid change based on information released by regulatory bodies including  the CDC and federal and state organizations. These policies and algorithms were followed during the patient's care in the ED.  Some ED evaluations and interventions may be delayed as a result of limited staffing during the pandemic.*     Medical Decision Making:  79 yo M here with AMS, likely 2/2 UTI with encephalopathy. Pt borderline febrile, confused, hypotensive on arrival. WBC 20k. LA normal w/o signs of shock/severe sepsis. Lytes otherwise at baseline. CXR clear. Abdomen soft, NT. UA is c/w UTI. Will admit for sepsis 2/2 UTI. IV ABX, 30 cc/kg fluids given. Family updated.  ____________________________________________  FINAL CLINICAL IMPRESSION(S) / ED DIAGNOSES  Final diagnoses:  Sepsis secondary to UTI [facility])     MEDICATIONS GIVEN DURING THIS VISIT:  Medications  sodium chloride flush (NS) 0.9 % injection 3 mL (3 mLs Intravenous Not Given 01/13/20 1344)  metroNIDAZOLE (FLAGYL) IVPB 500 mg ( Intravenous Stopped 01/13/20 1446)  sodium chloride 0.9 % bolus 1,000 mL (0 mLs Intravenous Stopped 01/13/20 1423)    And  sodium chloride 0.9 % bolus 1,000 mL (0 mLs Intravenous Stopped 01/13/20 1454)    And  sodium chloride 0.9 % bolus 200 mL (has no administration in time range)  vancomycin (VANCOCIN) IVPB 1000 mg/200 mL premix (1,000 mg Intravenous New Bag/Given 01/13/20 1453)  0.9 %  sodium chloride infusion (has no administration in time range)  ondansetron (ZOFRAN) injection 4 mg (has no administration in time range)  acetaminophen (TYLENOL) tablet 650 mg (has no administration in time range)  acetaminophen (TYLENOL) suppository 650 mg (650 mg Rectal Given 01/13/20 1337)  ceFEPIme (MAXIPIME) 2 g in  sodium chloride 0.9 % 100 mL IVPB ( Intravenous Stopped 01/13/20 1341)  vancomycin (VANCOCIN) IVPB 1000 mg/200 mL premix (1,000 mg Intravenous New Bag/Given 01/13/20 1413)     ED Discharge Orders    None       Note:  This document was prepared using Dragon voice recognition software and may  include unintentional dictation errors.   Shaune Pollack, MD 01/13/20 [phone]

## 2020-01-13 NOTE — ED Notes (Signed)
Pt continues to appear confused, speech clear. Noted to have accidentally removed IV to Right AC. Stretcher locked in lowest position, call bell within reach.

## 2020-01-13 NOTE — ED Notes (Signed)
NP at bedside. Pt noted to be trying to pull out foley. RN stopped pt and resecured it to pts leg. NP still at bedside. EKG obtained and provided to NP at her request.

## 2020-01-13 NOTE — ED Notes (Signed)
Pt taken to floor with RN on monitor via stretcher.  

## 2020-01-13 NOTE — ED Triage Notes (Signed)
Per EMS he was found not acting right  Slow to respond  Family member states he is confused  Last time he was confused he had a CVA

## 2020-01-13 NOTE — ED Notes (Signed)
Steward Drone, NP still at bedside.

## 2020-01-13 NOTE — ED Triage Notes (Signed)
First nurse note- here for "not acting himself".  Hx TIA. CBG 137, VSS with EMS. [facility].  Different from his normal per EMS since this morning.

## 2020-01-13 NOTE — ED Notes (Signed)
RN to bedside, pt trying to get out bed stating he needs to go to the bathroom to pee, RN explained that pt has foley catheter, pt continuously taking off monitors/leads. Attempted to redirect pt.  Dr Clyde Lundborg messaged via secure chat for prn for anxiety/agitation.  Family at bedside.  Call bell within reach, stretcher locked in lowest position

## 2020-01-13 NOTE — ED Notes (Signed)
See triage note. Pt to ED from [facility] for AMS, dx with UTI yesterday. Pt talking, not answering all questions appropriately or following all commands. Congestion/cough noted.  Dr Erma Heritage at bedside

## 2020-01-14 DIAGNOSIS — G4733 Obstructive sleep apnea (adult) (pediatric): Secondary | ICD-10-CM | POA: Diagnosis present

## 2020-01-14 DIAGNOSIS — E669 Obesity, unspecified: Secondary | ICD-10-CM | POA: Diagnosis present

## 2020-01-14 DIAGNOSIS — N401 Enlarged prostate with lower urinary tract symptoms: Secondary | ICD-10-CM | POA: Diagnosis present

## 2020-01-14 DIAGNOSIS — Z87440 Personal history of urinary (tract) infections: Secondary | ICD-10-CM | POA: Diagnosis not present

## 2020-01-14 DIAGNOSIS — Z7902 Long term (current) use of antithrombotics/antiplatelets: Secondary | ICD-10-CM | POA: Diagnosis not present

## 2020-01-14 DIAGNOSIS — Z6836 Body mass index (BMI) 36.0-36.9, adult: Secondary | ICD-10-CM | POA: Diagnosis not present

## 2020-01-14 DIAGNOSIS — G9341 Metabolic encephalopathy: Secondary | ICD-10-CM | POA: Diagnosis present

## 2020-01-14 DIAGNOSIS — K219 Gastro-esophageal reflux disease without esophagitis: Secondary | ICD-10-CM | POA: Diagnosis present

## 2020-01-14 DIAGNOSIS — E1165 Type 2 diabetes mellitus with hyperglycemia: Secondary | ICD-10-CM | POA: Diagnosis not present

## 2020-01-14 DIAGNOSIS — Z20822 Contact with and (suspected) exposure to covid-19: Secondary | ICD-10-CM | POA: Diagnosis present

## 2020-01-14 DIAGNOSIS — I69351 Hemiplegia and hemiparesis following cerebral infarction affecting right dominant side: Secondary | ICD-10-CM | POA: Diagnosis not present

## 2020-01-14 DIAGNOSIS — M170 Bilateral primary osteoarthritis of knee: Secondary | ICD-10-CM | POA: Diagnosis present

## 2020-01-14 DIAGNOSIS — I10 Essential (primary) hypertension: Secondary | ICD-10-CM | POA: Diagnosis present

## 2020-01-14 DIAGNOSIS — M549 Dorsalgia, unspecified: Secondary | ICD-10-CM | POA: Diagnosis present

## 2020-01-14 DIAGNOSIS — Z87891 Personal history of nicotine dependence: Secondary | ICD-10-CM | POA: Diagnosis not present

## 2020-01-14 DIAGNOSIS — R32 Unspecified urinary incontinence: Secondary | ICD-10-CM | POA: Diagnosis present

## 2020-01-14 DIAGNOSIS — Z888 Allergy status to other drugs, medicaments and biological substances status: Secondary | ICD-10-CM | POA: Diagnosis not present

## 2020-01-14 DIAGNOSIS — E1129 Type 2 diabetes mellitus with other diabetic kidney complication: Secondary | ICD-10-CM | POA: Diagnosis present

## 2020-01-14 DIAGNOSIS — N3000 Acute cystitis without hematuria: Secondary | ICD-10-CM | POA: Diagnosis not present

## 2020-01-14 DIAGNOSIS — E1140 Type 2 diabetes mellitus with diabetic neuropathy, unspecified: Secondary | ICD-10-CM | POA: Diagnosis present

## 2020-01-14 DIAGNOSIS — N390 Urinary tract infection, site not specified: Secondary | ICD-10-CM | POA: Diagnosis present

## 2020-01-14 DIAGNOSIS — Z96653 Presence of artificial knee joint, bilateral: Secondary | ICD-10-CM | POA: Diagnosis present

## 2020-01-14 DIAGNOSIS — R4182 Altered mental status, unspecified: Secondary | ICD-10-CM | POA: Diagnosis present

## 2020-01-14 DIAGNOSIS — F0390 Unspecified dementia without behavioral disturbance: Secondary | ICD-10-CM | POA: Diagnosis present

## 2020-01-14 DIAGNOSIS — Z833 Family history of diabetes mellitus: Secondary | ICD-10-CM | POA: Diagnosis not present

## 2020-01-14 DIAGNOSIS — G8929 Other chronic pain: Secondary | ICD-10-CM | POA: Diagnosis present

## 2020-01-14 DIAGNOSIS — E785 Hyperlipidemia, unspecified: Secondary | ICD-10-CM | POA: Diagnosis present

## 2020-01-14 LAB — CBC
HCT: 36.3 % — ABNORMAL LOW (ref 39.0–52.0)
Hemoglobin: 12.3 g/dL — ABNORMAL LOW (ref 13.0–17.0)
MCH: 30.1 pg (ref 26.0–34.0)
MCHC: 33.9 g/dL (ref 30.0–36.0)
MCV: 89 fL (ref 80.0–100.0)
Platelets: 195 10*3/uL (ref 150–400)
RBC: 4.08 MIL/uL — ABNORMAL LOW (ref 4.22–5.81)
RDW: 13.2 % (ref 11.5–15.5)
WBC: 20.6 10*3/uL — ABNORMAL HIGH (ref 4.0–10.5)
nRBC: 0 % (ref 0.0–0.2)

## 2020-01-14 LAB — GLUCOSE, CAPILLARY
Glucose-Capillary: 104 mg/dL — ABNORMAL HIGH (ref 70–99)
Glucose-Capillary: 108 mg/dL — ABNORMAL HIGH (ref 70–99)
Glucose-Capillary: 108 mg/dL — ABNORMAL HIGH (ref 70–99)
Glucose-Capillary: 139 mg/dL — ABNORMAL HIGH (ref 70–99)
Glucose-Capillary: 146 mg/dL — ABNORMAL HIGH (ref 70–99)
Glucose-Capillary: 166 mg/dL — ABNORMAL HIGH (ref 70–99)
Glucose-Capillary: 82 mg/dL (ref 70–99)
Glucose-Capillary: 85 mg/dL (ref 70–99)

## 2020-01-14 LAB — BASIC METABOLIC PANEL
Anion gap: 8 (ref 5–15)
BUN: 15 mg/dL (ref 8–23)
CO2: 25 mmol/L (ref 22–32)
Calcium: 9 mg/dL (ref 8.9–10.3)
Chloride: 103 mmol/L (ref 98–111)
Creatinine, Ser: 0.8 mg/dL (ref 0.61–1.24)
GFR calc Af Amer: >60 mL/min (ref >60)
GFR calc non Af Amer: >60 mL/min (ref >60)
Glucose, Bld: 86 mg/dL (ref 70–99)
Potassium: 3.8 mmol/L (ref 3.5–5.1)
Sodium: 136 mmol/L (ref 135–145)

## 2020-01-14 MED ORDER — CHLORHEXIDINE GLUCONATE CLOTH 2 % EX PADS
6.0000 | MEDICATED_PAD | Freq: Every day | CUTANEOUS | Status: DC
  Administered 2020-01-15 – 2020-01-16 (×2): 6 via TOPICAL

## 2020-01-14 NOTE — Progress Notes (Signed)
PROGRESS NOTE    David Sloan  OZH:[id number] DOB: 24-Feb-1941 DOA: 01/13/2020 PCP: [facility]   Assessment & Plan:   Principal Problem:   UTI (urinary tract infection) Active Problems:   GERD (gastroesophageal reflux disease)   Essential (primary) hypertension   BPH (benign prostatic hyperplasia)   Stroke (HCC)   Acute metabolic encephalopathy   HLD (hyperlipidemia)   Type II diabetes mellitus with renal manifestations (HCC)   UTI: UA is positive, urine cx pending. Continue on IV ceftriaxone & IVFs. Blood cxs NGTD  Leukocytosis: secondary to above. Continue on IV abxs  GERD: continue on PPI   HTN: home BP meds held due to low normal BP  Hx of  CVA: continue on aspirin, plavix, statin   Acute metabolic encephalopathy: CT head negative. Likely secondary to infection, UTI.  HLD: continue on statin   DM2: continue on NPH and SSI w/ accuchecks   BPH: continue on finasteride & flomax    DVT prophylaxis: lovenox Code Status: full  Family Communication:  Disposition Plan: depends on PT/OT recs.   Consultants:      Procedures:    Antimicrobials: ceftriaxone    Subjective: Pt c/o right 3rd finger pain   Objective: Vitals:   01/13/20 2029 01/14/20 0031 01/14/20 0426 01/14/20 0730  BP: 98/69 119/68 (!) 146/44 (!) 128/51  Pulse: 82 63 59 57  Resp: 16 17 17 16   Temp: 98.1 F (36.7 C) 99 F (37.2 C) 99.1 F (37.3 C) 98.2 F (36.8 C)  TempSrc:  Oral Oral Oral  SpO2: 100% 94% 94% 98%  Weight:      Height:        Intake/Output Summary (Last 24 hours) at 01/14/2020 0847 Last data filed at 01/14/2020 0559 Gross per 24 hour  Intake 3398.41 ml  Output 1475 ml  Net 1923.41 ml   Filed Weights   01/13/20 1058  Weight: (!) 107 kg    Examination:  General exam: Appears calm and comfortable  Respiratory system: Clear to auscultation. Respiratory effort normal. No rales Cardiovascular system: S1 & S2 +. No  rubs, gallops or clicks.   Gastrointestinal system: Abdomen is nondistended, soft and nontender. Normal bowel sounds heard. Central nervous system: Alert and oriented to self & place. Moves all 4 extremities Psychiatry: Judgement and insight appear abnormal. Anxious mood and affect    Data Reviewed: I have personally reviewed following labs and imaging studies  CBC: Recent Labs  Lab 01/13/20 1149 01/14/20 0505  WBC 20.3* 20.6*  HGB 13.3 12.3*  HCT 38.6* 36.3*  MCV 88.1 89.0  PLT 212 195   Basic Metabolic Panel: Recent Labs  Lab 01/13/20 1149 01/14/20 0505  NA 133* 136  K 4.3 3.8  CL 101 103  CO2 25 25  GLUCOSE 96 86  BUN 19 15  CREATININE 0.87 0.80  CALCIUM 9.3 9.0   GFR: Estimated Creatinine Clearance: 90.2 mL/min (by C-G formula based on SCr of 0.8 mg/dL). Liver Function Tests: Recent Labs  Lab 01/13/20 1149  AST 20  ALT 19  ALKPHOS 58  BILITOT 0.8  PROT 6.8  ALBUMIN 3.8   No results for input(s): LIPASE, AMYLASE in the last 168 hours. No results for input(s): AMMONIA in the last 168 hours. Coagulation Profile: Recent Labs  Lab 01/13/20 1424  INR 1.0   Cardiac Enzymes: No results for input(s): CKTOTAL, CKMB, CKMBINDEX, TROPONINI in the last 168 hours. BNP (last 3 results) No results for input(s): PROBNP in the last 8760 hours.  HbA1C: No results for input(s): HGBA1C in the last 72 hours. CBG: Recent Labs  Lab 01/13/20 1158 01/13/20 2119 01/13/20 2350 01/14/20 0425 01/14/20 0737  GLUCAP 82 108* 108* 82 85   Lipid Profile: No results for input(s): CHOL, HDL, LDLCALC, TRIG, CHOLHDL, LDLDIRECT in the last 72 hours. Thyroid Function Tests: No results for input(s): TSH, T4TOTAL, FREET4, T3FREE, THYROIDAB in the last 72 hours. Anemia Panel: No results for input(s): VITAMINB12, FOLATE, FERRITIN, TIBC, IRON, RETICCTPCT in the last 72 hours. Sepsis Labs: Recent Labs  Lab 01/13/20 1312 01/13/20 2036 01/13/20 2256  LATICACIDVEN 1.1 1.1 1.0    Recent Results (from the  past 240 hour(s))  Blood culture (routine x 2)     Status: None (Preliminary result)   Collection Time: 01/13/20  1:12 PM   Specimen: BLOOD  Result Value Ref Range Status   Specimen Description BLOOD BLOOD RIGHT FOREARM  Final   Special Requests   Final    BOTTLES DRAWN AEROBIC AND ANAEROBIC Blood Culture adequate volume   Culture   Final    NO GROWTH < 24 HOURS Performed at [facility], [address] [place], [place] [address]    Report Status PENDING  Incomplete  Blood culture (routine x 2)     Status: None (Preliminary result)   Collection Time: 01/13/20  1:12 PM   Specimen: BLOOD  Result Value Ref Range Status   Specimen Description BLOOD RIGHT ANTECUBITAL  Final   Special Requests   Final    BOTTLES DRAWN AEROBIC AND ANAEROBIC Blood Culture adequate volume   Culture   Final    NO GROWTH < 24 HOURS Performed at [facility], [address], [place], [place] [address]    Report Status PENDING  Incomplete  SARS Coronavirus 2 by RT PCR (hospital order, performed in [facility] hospital lab) Nasopharyngeal Nasopharyngeal Swab     Status: None   Collection Time: 01/13/20  1:12 PM   Specimen: Nasopharyngeal Swab  Result Value Ref Range Status   SARS Coronavirus 2 NEGATIVE NEGATIVE Final    Comment: (NOTE) SARS-CoV-2 target nucleic acids are NOT DETECTED.  The SARS-CoV-2 RNA is generally detectable in upper and lower respiratory specimens during the acute phase of infection. The lowest concentration of SARS-CoV-2 viral copies this assay can detect is 250 copies / mL. A negative result does not preclude SARS-CoV-2 infection and should not be used as the sole basis for treatment or other patient management decisions.  A negative result may occur with improper specimen collection / handling, submission of specimen other than nasopharyngeal swab, presence of viral mutation(s) within the areas targeted by this assay, and inadequate number of viral copies (<250  copies / mL). A negative result must be combined with clinical observations, patient history, and epidemiological information.  Fact Sheet for Patients:   BoilerBrush.com.cy  Fact Sheet for Healthcare Providers: https://pope.com/  This test is not yet approved or  cleared by the [place] FDA and has been authorized for detection and/or diagnosis of SARS-CoV-2 by FDA under an Emergency Use Authorization (EUA).  This EUA will remain in effect (meaning this test can be used) for the duration of the COVID-19 declaration under Section 564(b)(1) of the Act, 21 U.S.C. section 360bbb-3(b)(1), unless the authorization is terminated or revoked sooner.  Performed at [facility], [address], [place], [place] [address]          Radiology Studies: CT Head Wo Contrast  Result Date: 01/13/2020 CLINICAL DATA:  Mental status change. EXAM: CT HEAD WITHOUT CONTRAST TECHNIQUE: Contiguous axial images were obtained from the base of the skull through the vertex without intravenous contrast. COMPARISON:  02/15/2018 head CT and MRI. FINDINGS: Brain: Mild diffuse parenchymal volume loss with ex vacuo dilatation. Sequela of remote left frontal and left occipital lobe insults. Sequela of remote right centrum semiovale and left thalamic insults. Background scattered and confluent hypodense foci involving the periventricular and subcortical white matter are grossly unchanged. These are nonspecific however commonly associated with chronic microvascular ischemic changes. No new focal hypodensity to suggest acute infarct. No intracranial hemorrhage. No midline shift, mass lesion or extra-axial fluid collection. Vascular: No hyperdense vessel. Bilateral carotid siphon and V4 segment atherosclerotic calcifications. Skull: No acute osseous abnormality. Sinuses/Orbits: No acute orbital finding. Mild ethmoid and left maxillary sinus mucosal thickening. No  mastoid effusion. Other: None. IMPRESSION: No acute intracranial process. Sequela of remote insults involving the right centrum semiovale, left frontal and occipital regions and left thalamus. Mild cerebral atrophy. Advanced chronic microvascular ischemic changes. Electronically Signed   By: Stana Bunting M.D.   On: 01/13/2020 13:07   DG Chest Portable 1 View  Result Date: 01/13/2020 CLINICAL DATA:  Sepsis EXAM: PORTABLE CHEST 1 VIEW COMPARISON:  2019 FINDINGS: The heart size and mediastinal contours are within normal limits. Low lung volumes. No new consolidation or edema. No pleural effusion. IMPRESSION: No acute process in the chest. Electronically Signed   By: Guadlupe Spanish M.D.   On: 01/13/2020 14:09        Scheduled Meds: . aspirin  81 mg Oral Daily  . atorvastatin  40 mg Oral Daily  . Chlorhexidine Gluconate Cloth  6 each Topical Daily  . cholecalciferol  1,000 Units Oral Daily  . clopidogrel  75 mg Oral Daily  . diclofenac Sodium  4 g Topical BID  . divalproex  125 mg Oral Daily  . enoxaparin (LOVENOX) injection  40 mg Subcutaneous Q24H  . finasteride  5 mg Oral Daily  . gabapentin  100 mg Oral BID  . insulin aspart  0-9 Units Subcutaneous Q4H  . insulin detemir  20 Units Subcutaneous q morning - 10a  . insulin detemir  4 Units Subcutaneous QHS  . melatonin  10 mg Oral QHS  . mirabegron ER  50 mg Oral Daily  .  morphine injection  1 mg Intravenous Once  . multivitamin  5 mL Oral Daily  . pantoprazole  40 mg Oral Daily  . sertraline  25 mg Oral Daily  . sodium chloride flush  3 mL Intravenous Once  . tamsulosin  0.4 mg Oral Daily   Continuous Infusions: . sodium chloride 100 mL/hr at 01/14/20 0547  . cefTRIAXone (ROCEPHIN)  IV 2 g (01/14/20 0656)  . metronidazole 500 mg (01/14/20 0546)     LOS: 0 days    Time spent: 34 mins    Charise Killian, MD [facility] Pager [phone]  If 7PM-7AM, please contact  night-coverage [email or web address] 01/14/2020, 8:47 AM

## 2020-01-14 NOTE — Progress Notes (Signed)
Patient awake, alert to self and to place. Kept trying to pull at his foley. Mitts placed.

## 2020-01-14 NOTE — Progress Notes (Signed)
Patient alert to self. Patient was trying to get out of bed. NP ordered low bed and safety sitter. Safety sitter present at bedside. Waiting for low bed.

## 2020-01-14 NOTE — Evaluation (Signed)
Occupational Therapy Evaluation Patient Details Name: David Sloan MRN: [id number] DOB: [birth year] Today's Date: 01/14/2020    History of Present Illness David Sloan is a 79 y.o. male with medical history significant of hypertension, hyperlipidemia, diabetes mellitus, stroke with right-sided weakness,, GERD, OSA, BPH, chronic pain, who presents with altered mental status.    Clinical Impression   David Sloan was seen for OT evaluation this date. Prior to hospital admission, pt was resident at [facility] requiring assist for ADLs. Pt is unreliable historian and unable to provide details on setup or PLOF. Pt presents to acute OT demonstrating impaired ADL performance and functional mobility 2/2 poor command following, decreased LB access, functional ROM/balance deficits, and decreased activity tolerance. Pt currently requires SETUP self-drinking at bed level. MAX A don B socks at bed level. MOD A exit L side of bed c MOD A x2 to return to bed. Pt was restless c RLE repeatedly kicking while seated EOB - required MOD A to maintain static sitting balance, unable to progress safely to standing. Pt would benefit from skilled OT to address noted impairments and functional limitations (see below for any additional details) in order to maximize safety and independence while minimizing falls risk and caregiver burden. Upon hospital discharge, recommend STR to maximize pt safety and return to PLOF.     Follow Up Recommendations  SNF    Equipment Recommendations   (TBD)    Recommendations for Other Services       Precautions / Restrictions Precautions Precautions: Fall Restrictions Weight Bearing Restrictions: No      Mobility Bed Mobility Overal bed mobility: Needs Assistance Bed Mobility: Sit to Supine;Supine to Sit     Supine to sit: Mod assist;HOB elevated Sit to supine: Mod assist;+2 for physical assistance   General bed mobility comments: VCs for sequencing. MOD A scoot higher  in bed c cues for rail/BLE use  Transfers                 General transfer comment: deferred 2/2 pt restlessness in sitting and poor command following    Balance Overall balance assessment: Needs assistance Sitting-balance support: Feet supported;Bilateral upper extremity supported Sitting balance-Leahy Scale: Poor Sitting balance - Comments: MOD A maintain sitting balance - RLE kicking restlessly unable to stay still for static balance                                    ADL either performed or assessed with clinical judgement   ADL Overall ADL's : Needs assistance/impaired                                       General ADL Comments: SETUP self-drinking at bed level. MAX A don Bsocks at bed level      Vision         Perception     Praxis      Pertinent Vitals/Pain Pain Assessment: No/denies pain     Hand Dominance Right   Extremity/Trunk Assessment Upper Extremity Assessment Upper Extremity Assessment: Overall WFL for tasks assessed   Lower Extremity Assessment Lower Extremity Assessment: Generalized weakness       Communication Communication Communication: No difficulties   Cognition Arousal/Alertness: Awake/alert Behavior During Therapy: Restless Overall Cognitive Status: Impaired/Different from baseline Area of Impairment: Memory;Following commands;Safety/judgement;Awareness;Problem solving  Memory: Decreased short-term memory Following Commands: Follows one step commands inconsistently;Follows one step commands with increased time Safety/Judgement: Decreased awareness of safety;Decreased awareness of deficits   Problem Solving: Slow processing;Decreased initiation;Requires verbal cues;Requires tactile cues;Difficulty sequencing General Comments: Per RN, wife reports pt is not at baseline cognition. Pt oriented to self only, able to state locatio nas hospital c VCs, states he is in  [place].    General Comments       Exercises Exercises: Other exercises Other Exercises Other Exercises: Pt educated re:OT role, orientation to time/place/situation, falls prevention Other Exercises: LBD, self-drinking, sup<>sit, sitting balance/tolerance, bed mobility   Shoulder Instructions      Home Living Family/patient expects to be discharged to:: Skilled nursing facility                                 Additional Comments: Pt from [facility]      Prior Functioning/Environment Level of Independence: Needs assistance        Comments: Pt is poor historian, states w/c use and RW use. Reports assist from wife for bathing        OT Problem List: Decreased activity tolerance;Decreased range of motion;Impaired balance (sitting and/or standing);Decreased cognition;Decreased safety awareness;Decreased knowledge of use of DME or AE      OT Treatment/Interventions: Therapeutic exercise;Self-care/ADL training;Energy conservation;DME and/or AE instruction;Therapeutic activities;Patient/family education;Balance training;Cognitive remediation/compensation    OT Goals(Current goals can be found in the care plan section) Acute Rehab OT Goals Patient Stated Goal: To return to bed  OT Goal Formulation: With patient Time For Goal Achievement: 01/28/20 Potential to Achieve Goals: Fair ADL Goals Pt Will Perform Grooming: with set-up;sitting;with min guard assist Pt Will Perform Lower Body Dressing: with min assist;sitting/lateral leans Pt Will Transfer to Toilet: with min assist;with +2 assist;stand pivot transfer;bedside commode (c LRAD PRN)  OT Frequency: Min 1X/week   Barriers to D/C: Decreased caregiver support          Co-evaluation              AM-PAC OT "6 Clicks" Daily Activity     Outcome Measure Help from another person eating meals?: A Little Help from another person taking care of personal grooming?: A Little Help from another person  toileting, which includes using toliet, bedpan, or urinal?: A Lot Help from another person bathing (including washing, rinsing, drying)?: A Lot Help from another person to put on and taking off regular upper body clothing?: A Little Help from another person to put on and taking off regular lower body clothing?: A Lot 6 Click Score: 15   End of Session    Activity Tolerance: Patient tolerated treatment well Patient left: in bed;with call bell/phone within reach;with nursing/sitter in room (sitter in room )  OT Visit Diagnosis: Other abnormalities of gait and mobility (R26.89);Muscle weakness (generalized) (M62.81)                Time: 9675-9163 OT Time Calculation (min): 17 min Charges:  OT General Charges $OT Visit: 1 Visit OT Evaluation $OT Eval Moderate Complexity: 1 Mod OT Treatments $Self Care/Home Management : 8-22 mins  Kathie Dike, M.S. OTR/L  01/14/20, 2:00 PM  ascom [phone]

## 2020-01-15 LAB — URINE CULTURE
Culture: NO GROWTH
Special Requests: NORMAL

## 2020-01-15 LAB — GLUCOSE, CAPILLARY
Glucose-Capillary: 114 mg/dL — ABNORMAL HIGH (ref 70–99)
Glucose-Capillary: 111 mg/dL — ABNORMAL HIGH (ref 70–99)
Glucose-Capillary: 163 mg/dL — ABNORMAL HIGH (ref 70–99)
Glucose-Capillary: 128 mg/dL — ABNORMAL HIGH (ref 70–99)
Glucose-Capillary: 201 mg/dL — ABNORMAL HIGH (ref 70–99)

## 2020-01-15 LAB — CBC
HCT: 35.4 % — ABNORMAL LOW (ref 39.0–52.0)
Hemoglobin: 12 g/dL — ABNORMAL LOW (ref 13.0–17.0)
MCH: 30.5 pg (ref 26.0–34.0)
MCHC: 33.9 g/dL (ref 30.0–36.0)
MCV: 90.1 fL (ref 80.0–100.0)
Platelets: 180 10*3/uL (ref 150–400)
RBC: 3.93 MIL/uL — ABNORMAL LOW (ref 4.22–5.81)
RDW: 13 % (ref 11.5–15.5)
WBC: 17.4 10*3/uL — ABNORMAL HIGH (ref 4.0–10.5)
nRBC: 0 % (ref 0.0–0.2)

## 2020-01-15 LAB — BASIC METABOLIC PANEL
Anion gap: 9 (ref 5–15)
BUN: 11 mg/dL (ref 8–23)
CO2: 23 mmol/L (ref 22–32)
Calcium: 8.9 mg/dL (ref 8.9–10.3)
Chloride: 104 mmol/L (ref 98–111)
Creatinine, Ser: 0.7 mg/dL (ref 0.61–1.24)
GFR calc Af Amer: >60 mL/min (ref >60)
GFR calc non Af Amer: >60 mL/min (ref >60)
Glucose, Bld: 117 mg/dL — ABNORMAL HIGH (ref 70–99)
Potassium: 3.9 mmol/L (ref 3.5–5.1)
Sodium: 136 mmol/L (ref 135–145)

## 2020-01-15 LAB — GLUCOSE, POCT (MANUAL RESULT ENTRY): POC Glucose: 111 mg/dl — AB (ref 70–99)

## 2020-01-15 MED ORDER — INSULIN ASPART 100 UNIT/ML ~~LOC~~ SOLN
0.0000 [IU] | Freq: Three times a day (TID) | SUBCUTANEOUS | Status: DC
  Administered 2020-01-15: 1 [IU] via SUBCUTANEOUS
  Administered 2020-01-16 (×2): 3 [IU] via SUBCUTANEOUS
  Administered 2020-01-17 – 2020-01-18 (×2): 2 [IU] via SUBCUTANEOUS
  Administered 2020-01-18: 1 [IU] via SUBCUTANEOUS
  Filled 2020-01-15 (×6): qty 1

## 2020-01-15 NOTE — Progress Notes (Signed)
Physical Therapy Evaluation Patient Details Name: David Sloan MRN: [id number] DOB: 1940/10/12 Today's Date: 01/15/2020   History of Present Illness  per MD note:David Sloan is a 79 y.o. male with medical history significant of hypertension, hyperlipidemia, diabetes mellitus, stroke with right-sided weakness,, GERD, OSA, BPH, chronic pain, who presents with altered mental status.  Clinical Impression  Patient agrees to PT evaluation. Pt reports pain in penis during bed mobility assessment.  Pt has 2/5 strength BLE hip and knee and is max for bed mobility. Patient is unable to get into sitting EOB due to reports of pain in penis. Patient is unable to sit EOB. Patient is unable to stand or ambulate today.  Pt will continue to benefit from skilled PT to improve mobility and strength.    Follow Up Recommendations SNF    Equipment Recommendations  Rolling walker with 5" wheels    Recommendations for Other Services       Precautions / Restrictions        Mobility  Bed Mobility Overal bed mobility: Needs Assistance Bed Mobility: Supine to Sit;Sit to Supine     Supine to sit: Max assist Sit to supine: Max assist   General bed mobility comments: Patient was not able to get into sitting EOB due to pain in penis  Transfers Overall transfer level:  (unable due to decreased trunk strength and pain)                  Ambulation/Gait                Stairs            Wheelchair Mobility    Modified Rankin (Stroke Patients Only)       Balance                                             Pertinent Vitals/Pain Pain Assessment: Faces Faces Pain Scale: Hurts whole lot Pain Location:  (penis) Pain Intervention(s): Limited activity within patient's tolerance    Home Living Family/patient expects to be discharged to:: Unsure                      Prior Function                 Hand Dominance        Extremity/Trunk  Assessment   Upper Extremity Assessment Upper Extremity Assessment: Generalized weakness    Lower Extremity Assessment Lower Extremity Assessment: RLE deficits/detail;LLE deficits/detail RLE Deficits / Details: 2+/5 Hip flex, abd LLE Deficits / Details: 2+/5 Hip flex, abd       Communication      Cognition Arousal/Alertness: Awake/alert                                            General Comments      Exercises     Assessment/Plan    PT Assessment Patient needs continued PT services  PT Problem List Decreased strength;Decreased activity tolerance;Decreased mobility       PT Treatment Interventions Functional mobility training;Therapeutic activities;Therapeutic exercise;Balance training    PT Goals (Current goals can be found in the Care Plan section)  Acute Rehab PT Goals Patient Stated Goal: no goals stated PT Goal  Formulation: Patient unable to participate in goal setting Time For Goal Achievement: 01/29/20 Potential to Achieve Goals: Fair    Frequency Min 2X/week   Barriers to discharge        Co-evaluation               AM-PAC PT "6 Clicks" Mobility  Outcome Measure Help needed turning from your back to your side while in a flat bed without using bedrails?: A Lot Help needed moving from lying on your back to sitting on the side of a flat bed without using bedrails?: Total Help needed moving to and from a bed to a chair (including a wheelchair)?: Total Help needed standing up from a chair using your arms (e.g., wheelchair or bedside chair)?: Total Help needed to walk in hospital room?: Total Help needed climbing 3-5 steps with a railing? : Total 6 Click Score: 7    End of Session   Activity Tolerance: Patient limited by pain Patient left: in bed;with family/visitor present Nurse Communication:  (PT reported patients pain and need to set bed alarm) PT Visit Diagnosis: Unsteadiness on feet (R26.81);Muscle weakness (generalized)  (M62.81);Difficulty in walking, not elsewhere classified (R26.2);Pain Pain - part of body:  (penis)    Time: 3154-0086 PT Time Calculation (min) (ACUTE ONLY): 10 min   Charges:   PT Evaluation $PT Eval Low Complexity: 1 Low            David Sloan, PT DPT 01/15/2020, 3:54 PM

## 2020-01-15 NOTE — Progress Notes (Signed)
PT Cancellation Note  Patient Details Name: David Sloan MRN: [id number] DOB: 1940-11-24   Cancelled Treatment:    Reason Eval/Treat Not Completed: Patient declined, no reason specified   Ezekiel Ina, PT DPT 01/15/2020, 8:20 AM

## 2020-01-15 NOTE — Progress Notes (Signed)
PROGRESS NOTE    David Sloan  ZWC:[id number] DOB: April 01, 1941 DOA: 01/13/2020 PCP: [facility]   Assessment & Plan:   Principal Problem:   UTI (urinary tract infection) Active Problems:   GERD (gastroesophageal reflux disease)   Essential (primary) hypertension   BPH (benign prostatic hyperplasia)   Stroke (HCC)   Acute metabolic encephalopathy   HLD (hyperlipidemia)   Type II diabetes mellitus with renal manifestations (HCC)   UTI: UA is positive, urine cx pending. Continue on IV ceftriaxone. Blood cxs NGTD  Leukocytosis: secondary to above. Continue on IV abxs  GERD: continue on PPI   HTN: will restart home dose of amlodipine   Hx of  CVA: continue on aspirin, plavix, statin   Acute metabolic encephalopathy: CT head negative. Likely secondary to infection, UTI.  HLD: continue on statin   DM2: continue on NPH and SSI w/ accuchecks. Carb modified diet   BPH: continue on finasteride & flomax    DVT prophylaxis: lovenox Code Status: full  Family Communication:  Disposition Plan: likely d/c to SNF  Status is: Inpatient  Remains inpatient appropriate because:IV treatments appropriate due to intensity of illness or inability to take PO   Dispo: The patient is from: Home              Anticipated d/c is to: SNF              Anticipated d/c date is: 2 days              Patient currently is not medically stable to d/c.    Consultants:      Procedures:    Antimicrobials: ceftriaxone    Subjective: Pt c/o fatigue  Objective: Vitals:   01/14/20 0730 01/14/20 1523 01/14/20 2306 01/15/20 0744  BP: (!) 128/51 128/66 (!) 145/57 (!) 139/61  Pulse: 57 54 81 53  Resp: 16 16 18 17   Temp: 98.2 F (36.8 C) 98.3 F (36.8 C) 98.5 F (36.9 C) 97.7 F (36.5 C)  TempSrc: Oral Oral Oral Oral  SpO2: 98% 99% 96% 98%  Weight:      Height:        Intake/Output Summary (Last 24 hours) at 01/15/2020 0815 Last data filed at 01/15/2020  03/16/2020 Gross per 24 hour  Intake 480 ml  Output 3700 ml  Net -3220 ml   Filed Weights   01/13/20 1058  Weight: (!) 107 kg    Examination:  General exam: Appears calm and comfortable  Respiratory system: Clear breath sounds b/l. No wheezes. Cardiovascular system: S1 & S2 +. No  rubs, gallops or clicks.  Gastrointestinal system: Abdomen is obese, soft and nontender. Normal bowel sounds heard. Central nervous system: Alert and oriented to self. Moves all 4 extremities Psychiatry: Judgement and insight appear abnormal. Flat mood and affect    Data Reviewed: I have personally reviewed following labs and imaging studies  CBC: Recent Labs  Lab 01/13/20 1149 01/14/20 0505 01/15/20 0603  WBC 20.3* 20.6* 17.4*  HGB 13.3 12.3* 12.0*  HCT 38.6* 36.3* 35.4*  MCV 88.1 89.0 90.1  PLT 212 195 180   Basic Metabolic Panel: Recent Labs  Lab 01/13/20 1149 01/14/20 0505 01/15/20 0603  NA 133* 136 136  K 4.3 3.8 3.9  CL 101 103 104  CO2 25 25 23   GLUCOSE 96 86 117*  BUN 19 15 11   CREATININE 0.87 0.80 0.70  CALCIUM 9.3 9.0 8.9   GFR: Estimated Creatinine Clearance: 90.2 mL/min (by C-G formula based  on SCr of 0.7 mg/dL). Liver Function Tests: Recent Labs  Lab 01/13/20 1149  AST 20  ALT 19  ALKPHOS 58  BILITOT 0.8  PROT 6.8  ALBUMIN 3.8   No results for input(s): LIPASE, AMYLASE in the last 168 hours. No results for input(s): AMMONIA in the last 168 hours. Coagulation Profile: Recent Labs  Lab 01/13/20 1424  INR 1.0   Cardiac Enzymes: No results for input(s): CKTOTAL, CKMB, CKMBINDEX, TROPONINI in the last 168 hours. BNP (last 3 results) No results for input(s): PROBNP in the last 8760 hours. HbA1C: No results for input(s): HGBA1C in the last 72 hours. CBG: Recent Labs  Lab 01/14/20 1144 01/14/20 1632 01/14/20 2005 01/14/20 2333 01/15/20 0332  GLUCAP 104* 146* 139* 166* 114*   Lipid Profile: No results for input(s): CHOL, HDL, LDLCALC, TRIG, CHOLHDL,  LDLDIRECT in the last 72 hours. Thyroid Function Tests: No results for input(s): TSH, T4TOTAL, FREET4, T3FREE, THYROIDAB in the last 72 hours. Anemia Panel: No results for input(s): VITAMINB12, FOLATE, FERRITIN, TIBC, IRON, RETICCTPCT in the last 72 hours. Sepsis Labs: Recent Labs  Lab 01/13/20 1312 01/13/20 2036 01/13/20 2256  LATICACIDVEN 1.1 1.1 1.0    Recent Results (from the past 240 hour(s))  Blood culture (routine x 2)     Status: None (Preliminary result)   Collection Time: 01/13/20  1:12 PM   Specimen: BLOOD  Result Value Ref Range Status   Specimen Description BLOOD BLOOD RIGHT FOREARM  Final   Special Requests   Final    BOTTLES DRAWN AEROBIC AND ANAEROBIC Blood Culture adequate volume   Culture   Final    NO GROWTH 2 DAYS Performed at [facility], [address] [place], [place] [address]    Report Status PENDING  Incomplete  Blood culture (routine x 2)     Status: None (Preliminary result)   Collection Time: 01/13/20  1:12 PM   Specimen: BLOOD  Result Value Ref Range Status   Specimen Description BLOOD RIGHT ANTECUBITAL  Final   Special Requests   Final    BOTTLES DRAWN AEROBIC AND ANAEROBIC Blood Culture adequate volume   Culture   Final    NO GROWTH 2 DAYS Performed at [facility], [address] [place], [place] [address]    Report Status PENDING  Incomplete  SARS Coronavirus 2 by RT PCR (hospital order, performed in [facility] hospital lab) Nasopharyngeal Nasopharyngeal Swab     Status: None   Collection Time: 01/13/20  1:12 PM   Specimen: Nasopharyngeal Swab  Result Value Ref Range Status   SARS Coronavirus 2 NEGATIVE NEGATIVE Final    Comment: (NOTE) SARS-CoV-2 target nucleic acids are NOT DETECTED.  The SARS-CoV-2 RNA is generally detectable in upper and lower respiratory specimens during the acute phase of infection. The lowest concentration of SARS-CoV-2 viral copies this assay can detect is 250 copies / mL. A negative  result does not preclude SARS-CoV-2 infection and should not be used as the sole basis for treatment or other patient management decisions.  A negative result may occur with improper specimen collection / handling, submission of specimen other than nasopharyngeal swab, presence of viral mutation(s) within the areas targeted by this assay, and inadequate number of viral copies (<250 copies / mL). A negative result must be combined with clinical observations, patient history, and epidemiological information.  Fact Sheet for Patients:   BoilerBrush.com.cyhttps://www.fda.gov/media/136312/download  Fact Sheet for Healthcare Providers: https://pope.com/https://www.fda.gov/media/136313/download  This test is not yet approved or  cleared  by the [place] and has been authorized for detection and/or diagnosis of SARS-CoV-2 by FDA under an Emergency Use Authorization (EUA).  This EUA will remain in effect (meaning this test can be used) for the duration of the COVID-19 declaration under Section 564(b)(1) of the Act, 21 U.S.C. section 360bbb-3(b)(1), unless the authorization is terminated or revoked sooner.  Performed at [facility], [address] [place], [place] [address]          Radiology Studies: CT Head Wo Contrast  Result Date: 01/13/2020 CLINICAL DATA:  Mental status change. EXAM: CT HEAD WITHOUT CONTRAST TECHNIQUE: Contiguous axial images were obtained from the base of the skull through the vertex without intravenous contrast. COMPARISON:  02/15/2018 head CT and MRI. FINDINGS: Brain: Mild diffuse parenchymal volume loss with ex vacuo dilatation. Sequela of remote left frontal and left occipital lobe insults. Sequela of remote right centrum semiovale and left thalamic insults. Background scattered and confluent hypodense foci involving the periventricular and subcortical white matter are grossly unchanged. These are nonspecific however commonly associated with chronic microvascular ischemic  changes. No new focal hypodensity to suggest acute infarct. No intracranial hemorrhage. No midline shift, mass lesion or extra-axial fluid collection. Vascular: No hyperdense vessel. Bilateral carotid siphon and V4 segment atherosclerotic calcifications. Skull: No acute osseous abnormality. Sinuses/Orbits: No acute orbital finding. Mild ethmoid and left maxillary sinus mucosal thickening. No mastoid effusion. Other: None. IMPRESSION: No acute intracranial process. Sequela of remote insults involving the right centrum semiovale, left frontal and occipital regions and left thalamus. Mild cerebral atrophy. Advanced chronic microvascular ischemic changes. Electronically Signed   By: Stana Bunting M.D.   On: 01/13/2020 13:07   DG Chest Portable 1 View  Result Date: 01/13/2020 CLINICAL DATA:  Sepsis EXAM: PORTABLE CHEST 1 VIEW COMPARISON:  2019 FINDINGS: The heart size and mediastinal contours are within normal limits. Low lung volumes. No new consolidation or edema. No pleural effusion. IMPRESSION: No acute process in the chest. Electronically Signed   By: Guadlupe Spanish M.D.   On: 01/13/2020 14:09        Scheduled Meds: . aspirin  81 mg Oral Daily  . atorvastatin  40 mg Oral Daily  . Chlorhexidine Gluconate Cloth  6 each Topical Daily  . cholecalciferol  1,000 Units Oral Daily  . clopidogrel  75 mg Oral Daily  . diclofenac Sodium  4 g Topical BID  . divalproex  125 mg Oral Daily  . enoxaparin (LOVENOX) injection  40 mg Subcutaneous Q24H  . finasteride  5 mg Oral Daily  . gabapentin  100 mg Oral BID  . insulin aspart  0-9 Units Subcutaneous Q4H  . insulin detemir  20 Units Subcutaneous q morning - 10a  . insulin detemir  4 Units Subcutaneous QHS  . melatonin  10 mg Oral QHS  . mirabegron ER  50 mg Oral Daily  .  morphine injection  1 mg Intravenous Once  . multivitamin  5 mL Oral Daily  . pantoprazole  40 mg Oral Daily  . sertraline  25 mg Oral Daily  . sodium chloride flush  3 mL  Intravenous Once  . tamsulosin  0.4 mg Oral Daily   Continuous Infusions: . sodium chloride 100 mL/hr at 01/15/20 0417  . cefTRIAXone (ROCEPHIN)  IV 2 g (01/15/20 0520)  . metronidazole 500 mg (01/15/20 0418)     LOS: 1 day    Time spent: 30 mins    Charise Killian, MD [facility] Pager [phone]  [phone]  If 7PM-7AM, please contact night-coverage [email or web address] 01/15/2020, 8:15 AM

## 2020-01-15 NOTE — Plan of Care (Signed)

## 2020-01-16 ENCOUNTER — Ambulatory Visit: Payer: Self-pay

## 2020-01-16 LAB — CBC
HCT: 35.7 % — ABNORMAL LOW (ref 39.0–52.0)
Hemoglobin: 12.6 g/dL — ABNORMAL LOW (ref 13.0–17.0)
MCH: 30.5 pg (ref 26.0–34.0)
MCHC: 35.3 g/dL (ref 30.0–36.0)
MCV: 86.4 fL (ref 80.0–100.0)
Platelets: 196 10*3/uL (ref 150–400)
RBC: 4.13 MIL/uL — ABNORMAL LOW (ref 4.22–5.81)
RDW: 13.2 % (ref 11.5–15.5)
WBC: 13.9 10*3/uL — ABNORMAL HIGH (ref 4.0–10.5)
nRBC: 0 % (ref 0.0–0.2)

## 2020-01-16 LAB — GLUCOSE, CAPILLARY
Glucose-Capillary: 115 mg/dL — ABNORMAL HIGH (ref 70–99)
Glucose-Capillary: 204 mg/dL — ABNORMAL HIGH (ref 70–99)
Glucose-Capillary: 226 mg/dL — ABNORMAL HIGH (ref 70–99)
Glucose-Capillary: 163 mg/dL — ABNORMAL HIGH (ref 70–99)

## 2020-01-16 LAB — BASIC METABOLIC PANEL
Anion gap: 6 (ref 5–15)
BUN: 12 mg/dL (ref 8–23)
CO2: 29 mmol/L (ref 22–32)
Calcium: 8.8 mg/dL — ABNORMAL LOW (ref 8.9–10.3)
Chloride: 102 mmol/L (ref 98–111)
Creatinine, Ser: 0.71 mg/dL (ref 0.61–1.24)
GFR calc Af Amer: >60 mL/min (ref >60)
GFR calc non Af Amer: >60 mL/min (ref >60)
Glucose, Bld: 151 mg/dL — ABNORMAL HIGH (ref 70–99)
Potassium: 3.8 mmol/L (ref 3.5–5.1)
Sodium: 137 mmol/L (ref 135–145)

## 2020-01-16 MED ORDER — OXYCODONE-ACETAMINOPHEN 5-325 MG PO TABS
1.0000 | ORAL_TABLET | Freq: Four times a day (QID) | ORAL | Status: DC | PRN
  Administered 2020-01-16 – 2020-01-17 (×2): 2 via ORAL
  Filled 2020-01-16 (×2): qty 2

## 2020-01-16 MED ORDER — CEFDINIR 300 MG PO CAPS
300.0000 mg | ORAL_CAPSULE | Freq: Two times a day (BID) | ORAL | Status: DC
  Administered 2020-01-17 – 2020-01-18 (×3): 300 mg via ORAL
  Filled 2020-01-16 (×5): qty 1

## 2020-01-16 MED ORDER — AMLODIPINE BESYLATE 5 MG PO TABS
5.0000 mg | ORAL_TABLET | Freq: Every day | ORAL | Status: DC
  Administered 2020-01-16 – 2020-01-17 (×2): 5 mg via ORAL
  Filled 2020-01-16 (×2): qty 1

## 2020-01-16 NOTE — TOC Progression Note (Signed)
Transition of Care [facility]) - Progression Note    Patient Details  Name: David Sloan MRN: [id number] Date of Birth: September 12, 1940  Transition of Care [facility]) CM/SW Contact  Barrie Dunker, RN Phone Number: 01/16/2020, 11:26 AM  Clinical Narrative:   Spoke with the patient, he is alert and oriented X 4, He plans to return to [facility], I called Thayer Ohm at [facility] and they will accept the patient, sent thru the [facility], Started insurance process to get approval, faxed clinical to [phone]         Expected Discharge Plan and Services                                                 Social Determinants of Health (SDOH) Interventions    Readmission Risk Interventions No flowsheet data found.

## 2020-01-16 NOTE — Progress Notes (Signed)
PROGRESS NOTE    David Sloan  ZOX:[id number] DOB: 1940/08/28 DOA: 01/13/2020 [facility]CP: [facility]   Assessment & Plan:   Principal Problem:   UTI (urinary tract infection) Active Problems:   GERD (gastroesophageal reflux disease)   Essential (primary) hypertension   BPH (benign prostatic hyperplasia)   Stroke (HCC)   Acute metabolic encephalopathy   HLD (hyperlipidemia)   Type II diabetes mellitus with renal manifestations (HCC)   UTI: UA is positive, urine cx shows no growth. Transition to po cefdinir. Blood cxs NGTD  Sepsis: r/o. Does not meet sepsis criteria.   Leukocytosis: secondary to above. Continue on abxs  GERD: continue on pantoprazole    HTN: will continue on home dose of amlodipine   Hx of  CVA: continue on aspirin, plavix, statin   Acute metabolic encephalopathy: CT head negative. Likely secondary to infection, UTI. Improved today, knows self, place, year   HLD: continue on statin   DM2: continue on levemir and SSI w/ accuchecks. Carb modified diet   BPH: continue on proscar & tamsulosin    DVT prophylaxis: lovenox Code Status: full  Family Communication:  Disposition Plan: likely d/c to SNF  Status is: Inpatient  Remains inpatient appropriate because: unsafe d/c plan    Dispo: The patient is from: Home              Anticipated d/c is to: SNF              Anticipated d/c date is: 2 days              Patient currently is not medically stable to d/c.    Consultants:      Procedures:    Antimicrobials: cefdinir   Subjective: Pt c/o malaise  Objective: Vitals:   01/15/20 0744 01/15/20 1611 01/15/20 2300 01/16/20 0740  BP: (!) 139/61 (!) 159/59 (!) 165/86 (!) 172/82  Pulse: 53 (!) 107 67 105  Resp: 17 20 20 19   Temp: 97.7 F (36.5 C) 98 F (36.7 C)  98.5 F (36.9 C)  TempSrc: Oral Oral Oral Oral  SpO2: 98% 98%  100%  Weight:      Height:        Intake/Output Summary (Last 24 hours) at 01/16/2020  0805 Last data filed at 01/15/2020 2304 Gross per 24 hour  Intake 3809.71 ml  Output 1250 ml  Net 2559.71 ml   Filed Weights   01/13/20 1058  Weight: (!) 107 kg    Examination: General exam: Appears calm and comfortable  Respiratory system: Clear to auscultation. Respiratory effort normal. No wheezes Cardiovascular system: S1 & S2 +. No  rubs, gallops or clicks.  Gastrointestinal system: Abdomen is obese, soft and nontender. Normal bowel sounds heard. Central nervous system: Alert and oriented. Moves all 4 extremities Psychiatry: Judgement and insight appear normal. Flat mood and affect     Data Reviewed: I have personally reviewed following labs and imaging studies  CBC: Recent Labs  Lab 01/13/20 1149 01/14/20 0505 01/15/20 0603 01/16/20 0434  WBC 20.3* 20.6* 17.4* 13.9*  HGB 13.3 12.3* 12.0* 12.6*  HCT 38.6* 36.3* 35.4* 35.7*  MCV 88.1 89.0 90.1 86.4  PLT 212 195 180 196   Basic Metabolic Panel: Recent Labs  Lab 01/13/20 1149 01/14/20 0505 01/15/20 0603 01/16/20 0434  NA 133* 136 136 137  K 4.3 3.8 3.9 3.8  CL 101 103 104 102  CO2 25 25 23 29   GLUCOSE 96 86 117* 151*  BUN 19 15  11 12  CREATININE 0.87 0.80 0.70 0.71  CALCIUM 9.3 9.0 8.9 8.8*   GFR: Estimated Creatinine Clearance: 90.2 mL/min (by C-G formula based on SCr of 0.71 mg/dL). Liver Function Tests: Recent Labs  Lab 01/13/20 1149  AST 20  ALT 19  ALKPHOS 58  BILITOT 0.8  PROT 6.8  ALBUMIN 3.8   No results for input(s): LIPASE, AMYLASE in the last 168 hours. No results for input(s): AMMONIA in the last 168 hours. Coagulation Profile: Recent Labs  Lab 01/13/20 1424  INR 1.0   Cardiac Enzymes: No results for input(s): CKTOTAL, CKMB, CKMBINDEX, TROPONINI in the last 168 hours. BNP (last 3 results) No results for input(s): PROBNP in the last 8760 hours. HbA1C: No results for input(s): HGBA1C in the last 72 hours. CBG: Recent Labs  Lab 01/15/20 0742 01/15/20 1136 01/15/20 1624  01/15/20 2042 01/16/20 0743  GLUCAP 111* 163* 128* 201* 115*   Lipid Profile: No results for input(s): CHOL, HDL, LDLCALC, TRIG, CHOLHDL, LDLDIRECT in the last 72 hours. Thyroid Function Tests: No results for input(s): TSH, T4TOTAL, FREET4, T3FREE, THYROIDAB in the last 72 hours. Anemia Panel: No results for input(s): VITAMINB12, FOLATE, FERRITIN, TIBC, IRON, RETICCTPCT in the last 72 hours. Sepsis Labs: Recent Labs  Lab 01/13/20 1312 01/13/20 2036 01/13/20 2256  LATICACIDVEN 1.1 1.1 1.0    Recent Results (from the past 240 hour(s))  Urine culture     Status: None   Collection Time: 01/13/20 12:58 PM   Specimen: Urine, Catheterized  Result Value Ref Range Status   Specimen Description   Final    URINE, CATHETERIZED Performed at [facility], [address], [place], [place] [address]    Special Requests   Final    Normal Performed at [facility], [address], [place], [place] [address]    Culture   Final    NO GROWTH Performed at [facility], [address] [place], [place] [address]    Report Status 01/15/2020 FINAL  Final  Blood culture (routine x 2)     Status: None (Preliminary result)   Collection Time: 01/13/20  1:12 PM   Specimen: BLOOD  Result Value Ref Range Status   Specimen Description BLOOD BLOOD RIGHT FOREARM  Final   Special Requests   Final    BOTTLES DRAWN AEROBIC AND ANAEROBIC Blood Culture adequate volume   Culture   Final    NO GROWTH 3 DAYS Performed at [facility], [address] [place], [place] [address]    Report Status PENDING  Incomplete  Blood culture (routine x 2)     Status: None (Preliminary result)   Collection Time: 01/13/20  1:12 PM   Specimen: BLOOD  Result Value Ref Range Status   Specimen Description BLOOD RIGHT ANTECUBITAL  Final   Special Requests   Final    BOTTLES DRAWN AEROBIC AND ANAEROBIC Blood Culture adequate volume   Culture   Final    NO GROWTH 3 DAYS Performed at  [facility], [address] [place], [place] [address]    Report Status PENDING  Incomplete  SARS Coronavirus 2 by RT PCR (hospital order, performed in [facility] hospital lab) Nasopharyngeal Nasopharyngeal Swab     Status: None   Collection Time: 01/13/20  1:12 PM   Specimen: Nasopharyngeal Swab  Result Value Ref Range Status   SARS Coronavirus 2 NEGATIVE NEGATIVE Final    Comment: (NOTE) SARS-CoV-2 target nucleic acids are NOT DETECTED.  The SARS-CoV-2  RNA is generally detectable in upper and lower respiratory specimens during the acute phase of infection. The lowest concentration of SARS-CoV-2 viral copies this assay can detect is 250 copies / mL. A negative result does not preclude SARS-CoV-2 infection and should not be used as the sole basis for treatment or other patient management decisions.  A negative result may occur with improper specimen collection / handling, submission of specimen other than nasopharyngeal swab, presence of viral mutation(s) within the areas targeted by this assay, and inadequate number of viral copies (<250 copies / mL). A negative result must be combined with clinical observations, patient history, and epidemiological information.  Fact Sheet for Patients:   BoilerBrush.com.cy  Fact Sheet for Healthcare Providers: https://pope.com/  This test is not yet approved or  cleared by the [place] FDA and has been authorized for detection and/or diagnosis of SARS-CoV-2 by FDA under an Emergency Use Authorization (EUA).  This EUA will remain in effect (meaning this test can be used) for the duration of the COVID-19 declaration under Section 564(b)(1) of the Act, 21 U.S.C. section 360bbb-3(b)(1), unless the authorization is terminated or revoked sooner.  Performed at [facility], [address], [place], [place] [address]          Radiology Studies: No results  found.      Scheduled Meds: . aspirin  81 mg Oral Daily  . atorvastatin  40 mg Oral Daily  . Chlorhexidine Gluconate Cloth  6 each Topical Daily  . cholecalciferol  1,000 Units Oral Daily  . clopidogrel  75 mg Oral Daily  . diclofenac Sodium  4 g Topical BID  . divalproex  125 mg Oral Daily  . enoxaparin (LOVENOX) injection  40 mg Subcutaneous Q24H  . finasteride  5 mg Oral Daily  . gabapentin  100 mg Oral BID  . insulin aspart  0-9 Units Subcutaneous TID WC  . insulin detemir  20 Units Subcutaneous q morning - 10a  . insulin detemir  4 Units Subcutaneous QHS  . melatonin  10 mg Oral QHS  . mirabegron ER  50 mg Oral Daily  .  morphine injection  1 mg Intravenous Once  . multivitamin  5 mL Oral Daily  . pantoprazole  40 mg Oral Daily  . sertraline  25 mg Oral Daily  . sodium chloride flush  3 mL Intravenous Once  . tamsulosin  0.4 mg Oral Daily   Continuous Infusions: . cefTRIAXone (ROCEPHIN)  IV 2 g (01/16/20 0557)  . metronidazole 500 mg (01/16/20 0443)     LOS: 2 days    Time spent: 31 mins    Charise Killian, MD [facility] Pager [phone]  If 7PM-7AM, please contact night-coverage [email or web address] 01/16/2020, 8:05 AM

## 2020-01-16 NOTE — [place] FL2 (Signed)
[place] MEDICAID [id number] LEVEL OF CARE SCREENING TOOL     IDENTIFICATION  Patient Name: David Sloan Birthdate: 01-Jul-1940 Sex: male Admission Date (Current Location): 01/13/2020  [place] and [place] Number:  Chiropodist and Address:  [facility], [address], [place], [place] [address]      Provider Number: [id number]  Attending Physician Name and Address:  Charise Killian, MD  Relative Name and Phone Number:  Marti Sleigh Daughter [phone]    Current Level of Care: Hospital Recommended Level of Care: Skilled Nursing Facility Prior Approval Number:    Date Approved/Denied:   PASRR Number: [id number]  Discharge Plan: Home    Current Diagnoses: Patient Active Problem List   Diagnosis Date Noted   UTI (urinary tract infection) 01/13/2020   Acute metabolic encephalopathy 01/13/2020   HLD (hyperlipidemia) 01/13/2020   Type II diabetes mellitus with renal manifestations (HCC) 01/13/2020   Sepsis (HCC) 01/13/2020   Angioedema 04/22/2018   Long term current use of opiate analgesic 03/04/2018   Nontraumatic intracerebral hemorrhage (HCC) 02/09/2018   Chronic pain syndrome 09/23/2017   Hyperlipidemia, mixed 06/24/2017   BPH (benign prostatic hyperplasia) 06/24/2017   History of CVA (cerebrovascular accident) 06/24/2017   Primary open angle glaucoma (POAG) of both eyes 06/24/2017   Controlled type 2 diabetes mellitus without complication, with long-term current use of insulin (HCC) 06/24/2017   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   Pain of left femur 09/29/2016   Pain medication agreement signed 08/05/2016   Fall 10/03/2015   Gait instability 10/03/2015   TIA (transient ischemic attack) 10/03/2015   Acute CVA (cerebrovascular accident) (HCC) 10/03/2015   CVA (cerebral infarction) 10/03/2015   Risk for falls 07/30/2015   S/P cholecystectomy 07/30/2015   Diabetic neuropathy (HCC) 07/17/2015    Scoliosis 04/13/2015   Fibrositis 04/13/2015   Slurred speech 03/31/2015   Elevated transaminase level 03/31/2015   Gallstones 03/31/2015   Lower extremity weakness 03/30/2015   Cerebral infarction (HCC)    Elevated troponin    Uncontrolled type 2 diabetes mellitus with circulatory disorder (HCC)    EKG abnormalities    Emesis    Gall bladder disease    Other long term (current) drug therapy 02/13/2015   Essential (primary) hypertension 09/18/2014   Right sciatic nerve pain 09/15/2014   Bradycardia 09/13/2014   Cognitive disorder 04/05/2014   Chronic pain associated with significant psychosocial dysfunction 01/17/2014   Back ache 01/17/2014   Esophageal stenosis 11/30/2013   Can't get food down 11/30/2013   Esophageal stricture 11/30/2013   Chronic, continuous use of opioids 07/06/2013   Cerebrovascular accident (CVA) (HCC) 05/19/2013   Hypothyroid 05/19/2013   GERD (gastroesophageal reflux disease) 05/19/2013   Stroke (HCC) 05/19/2013   Gastroesophageal reflux disease without esophagitis 05/19/2013   Spondylosis of lumbar region without myelopathy or radiculopathy 01/11/2013   SI (sacroiliac) pain 10/06/2012   Osteoarthritis 10/06/2012   Obstructive sleep apnea 10/06/2012   Lumbar degenerative disc disease 10/06/2012   Facet arthritis of lumbar region 10/06/2012   Diabetes mellitus (HCC) 10/06/2012   Diabetes (HCC) 10/06/2012   Punctate keratitis 01/21/2012   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HCC) 01/21/2012   Type 2 diabetes mellitus (HCC) 10/16/2010   Tear film insufficiency 10/16/2010   Senile corneal changes 10/16/2010   Primary open-angle glaucoma 10/16/2010   Moderate stage glaucoma 10/16/2010   Background diabetic retinopathy (HCC) 10/16/2010   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) 10/16/2010    Orientation RESPIRATION BLADDER Height & Weight  Self, Time, Situation, Place  Normal Continent Weight: (!)  107 kg Height:  5\' 7"  (170.2 cm)  BEHAVIORAL SYMPTOMS/MOOD NEUROLOGICAL BOWEL NUTRITION STATUS      Continent Diet (Soft Diet)  AMBULATORY STATUS COMMUNICATION OF NEEDS Skin   Extensive Assist Verbally Normal                       Personal Care Assistance Level of Assistance  Bathing, Feeding, Dressing Bathing Assistance: Limited assistance Feeding assistance: Limited assistance Dressing Assistance: Limited assistance     Functional Limitations Info  Sight, Hearing, Speech Sight Info: Adequate Hearing Info: Adequate Speech Info: Adequate    SPECIAL CARE FACTORS FREQUENCY  PT (By licensed PT), OT (By licensed OT)     PT Frequency: 5 times per week OT Frequency: 5 times per week            Contractures Contractures Info: Not present    Additional Factors Info  Code Status Code Status Info: full code             Current Medications (01/16/2020):  This is the current hospital active medication list Current Facility-Administered Medications  Medication Dose Route Frequency Provider Last Rate Last Admin   acetaminophen (TYLENOL) tablet 650 mg  650 mg Oral Q6H PRN 03/17/2020, MD   650 mg at 01/14/20 1808   amLODipine (NORVASC) tablet 5 mg  5 mg Oral Daily 01/16/20, MD   5 mg at 01/16/20 03/17/20   aspirin chewable tablet 81 mg  81 mg Oral Daily 1610, MD   81 mg at 01/16/20 0911   atorvastatin (LIPITOR) tablet 40 mg  40 mg Oral Daily 03/17/20, MD   40 mg at 01/16/20 0912   cefTRIAXone (ROCEPHIN) 2 g in sodium chloride 0.9 % 100 mL IVPB  2 g Intravenous Q24H 03/17/20, MD 200 mL/hr at 01/16/20 0557 2 g at 01/16/20 0557   Chlorhexidine Gluconate Cloth 2 % PADS 6 each  6 each Topical Daily 03/17/20, MD   6 each at 01/16/20 0913   cholecalciferol (VITAMIN D3) tablet 1,000 Units  1,000 Units Oral Daily 03/17/20, MD   1,000 Units at 01/16/20 0912   clopidogrel (PLAVIX) tablet 75 mg  75 mg Oral Daily 03/17/20, MD   75 mg at 01/16/20 03/17/20    diclofenac Sodium (VOLTAREN) 1 % topical gel 4 g  4 g Topical BID 9604, MD       divalproex (DEPAKOTE SPRINKLE) capsule 125 mg  125 mg Oral Daily Lorretta Harp, MD   125 mg at 01/15/20 0801   enoxaparin (LOVENOX) injection 40 mg  40 mg Subcutaneous Q24H 03/16/20, MD   40 mg at 01/15/20 2122   finasteride (PROSCAR) tablet 5 mg  5 mg Oral Daily 2123, MD   5 mg at 01/16/20 03/17/20   gabapentin (NEURONTIN) capsule 100 mg  100 mg Oral BID 5409, MD   100 mg at 01/16/20 0911   guaiFENesin-dextromethorphan (ROBITUSSIN DM) 100-10 MG/5ML syrup 5 mL  5 mL Oral Q6H PRN 03/17/20, MD       insulin aspart (novoLOG) injection 0-9 Units  0-9 Units Subcutaneous TID WC Lorretta Harp, MD   1 Units at 01/15/20 1633   insulin detemir (LEVEMIR) injection 20 Units  20 Units Subcutaneous q morning - 10a 03/16/20, MD   20 Units at 01/15/20 0801   insulin detemir (LEVEMIR) injection 4 Units  4 Units  Subcutaneous QHS Lorretta Harp, MD   4 Units at 01/15/20 2121   melatonin tablet 10 mg  10 mg Oral QHS Lorretta Harp, MD   10 mg at 01/15/20 2121   metroNIDAZOLE (FLAGYL) IVPB 500 mg  500 mg Intravenous Alean Rinne, MD 100 mL/hr at 01/16/20 0443 500 mg at 01/16/20 0443   mirabegron ER (MYRBETRIQ) tablet 50 mg  50 mg Oral Daily Lorretta Harp, MD   50 mg at 01/16/20 0912   morphine 2 MG/ML injection 1 mg  1 mg Intravenous Once Manuela Schwartz, NP       multivitamin liquid 5 mL  5 mL Oral Daily Lorretta Harp, MD   5 mL at 01/16/20 0909   ondansetron (ZOFRAN) injection 4 mg  4 mg Intravenous Q8H PRN Lorretta Harp, MD       oxyCODONE-acetaminophen (PERCOCET/ROXICET) 5-325 MG per tablet 1-2 tablet  1-2 tablet Oral Q6H PRN Charise Killian, MD       pantoprazole (PROTONIX) EC tablet 40 mg  40 mg Oral Daily Lorretta Harp, MD   40 mg at 01/16/20 0910   sertraline (ZOLOFT) tablet 25 mg  25 mg Oral Daily Lorretta Harp, MD   25 mg at 01/16/20 4496   sodium chloride flush (NS) 0.9 % injection 3 mL  3 mL Intravenous  Once Lorretta Harp, MD       tamsulosin [facility]) capsule 0.4 mg  0.4 mg Oral Daily Lorretta Harp, MD   0.4 mg at 01/16/20 0911     Discharge Medications: Please see discharge summary for a list of discharge medications.  Relevant Imaging Results:  Relevant Lab Results:   Additional Information SSN:  [id number]  Barrie Dunker, RN

## 2020-01-17 LAB — CBC
HCT: 35.5 % — ABNORMAL LOW (ref 39.0–52.0)
Hemoglobin: 12.4 g/dL — ABNORMAL LOW (ref 13.0–17.0)
MCH: 30 pg (ref 26.0–34.0)
MCHC: 34.9 g/dL (ref 30.0–36.0)
MCV: 86 fL (ref 80.0–100.0)
Platelets: 232 10*3/uL (ref 150–400)
RBC: 4.13 MIL/uL — ABNORMAL LOW (ref 4.22–5.81)
RDW: 13.1 % (ref 11.5–15.5)
WBC: 11.1 10*3/uL — ABNORMAL HIGH (ref 4.0–10.5)
nRBC: 0 % (ref 0.0–0.2)

## 2020-01-17 LAB — BASIC METABOLIC PANEL
Anion gap: 9 (ref 5–15)
BUN: 13 mg/dL (ref 8–23)
CO2: 26 mmol/L (ref 22–32)
Calcium: 9 mg/dL (ref 8.9–10.3)
Chloride: 97 mmol/L — ABNORMAL LOW (ref 98–111)
Creatinine, Ser: 0.7 mg/dL (ref 0.61–1.24)
GFR calc Af Amer: >60 mL/min (ref >60)
GFR calc non Af Amer: >60 mL/min (ref >60)
Glucose, Bld: 110 mg/dL — ABNORMAL HIGH (ref 70–99)
Potassium: 3.8 mmol/L (ref 3.5–5.1)
Sodium: 132 mmol/L — ABNORMAL LOW (ref 135–145)

## 2020-01-17 LAB — GLUCOSE, CAPILLARY
Glucose-Capillary: 78 mg/dL (ref 70–99)
Glucose-Capillary: 78 mg/dL (ref 70–99)
Glucose-Capillary: 171 mg/dL — ABNORMAL HIGH (ref 70–99)
Glucose-Capillary: 108 mg/dL — ABNORMAL HIGH (ref 70–99)
Glucose-Capillary: 144 mg/dL — ABNORMAL HIGH (ref 70–99)

## 2020-01-17 MED ORDER — AMLODIPINE BESYLATE 10 MG PO TABS
10.0000 mg | ORAL_TABLET | Freq: Every day | ORAL | Status: DC
  Administered 2020-01-18: 10 mg via ORAL
  Filled 2020-01-17 (×2): qty 1

## 2020-01-17 NOTE — TOC Progression Note (Signed)
Transition of Care [facility]) - Progression Note    Patient Details  Name: BENJIE RICKETSON MRN: [id number] Date of Birth: 09/30/1940  [facility]) CM/SW Contact  Barrie Dunker, RN Phone Number: 01/17/2020, 11:20 AM  Clinical Narrative:   Barrie Dunker [facility] to inquire about the Auth approval, [facility] stated that they do not manage the insurance for this patient, I called Thayer Ohm with [facility] and notified him that they would need to get the insurance approval, he will notify me once they get it         Expected Discharge Plan and Services                                                 Social Determinants of Health (SDOH) Interventions    Readmission Risk Interventions No flowsheet data found.

## 2020-01-17 NOTE — Progress Notes (Signed)
Physical Therapy Treatment Patient Details Name: David Sloan MRN: [id number] DOB: Jun 20, 1940 Today's Date: 01/17/2020    History of Present Illness per MD note:David Sloan is a 79 y.o. male with medical history significant of hypertension, hyperlipidemia, diabetes mellitus, stroke with right-sided weakness,, GERD, OSA, BPH, chronic pain, who presents with altered mental status.    PT Comments    Pt pleasant and motivated to participate during the session. Pt only oriented to self; appeared generally confused and at one point stated that his stroke was 2 weeks ago. Pt found in bed at beginning of session with urinal between legs and with bedding saturated with urine. Nursing notified and came to room to assist with bedding change. Pt able to roll to each side with min-A, cueing for sequencing, and increased effort. Pt needed mod-A with BLE for supine exercises; pt stated previous CVA affected his RLE, however pt needed fairly equal assist to move LLE as RLE. Pt able to come to sitting at EOB with max A for BLE and trunk control. Pt able to sit-to-stand to RW w/ +2 mod-A to initiate movement; mod cueing required for hand positioning. Pt attempted marches in place; was able to briefly lift BLE x2 before fatiguing and returning to sit. Pt mod assist sit-to-supine for trunk control and re-positioning; pt able to move BLE most of the way back to bed with no physical assist. HR and SpO2 WFL throughout session. Pt will benefit from PT services in a SNF setting upon discharge to safely address deficits listed in patient problem list for decreased caregiver assistance and eventual return to PLOF.   Follow Up Recommendations  SNF     Equipment Recommendations  Rolling walker with 5" wheels    Recommendations for Other Services       Precautions / Restrictions Precautions Precautions: Fall Restrictions Weight Bearing Restrictions: No    Mobility  Bed Mobility Overal bed mobility: Needs  Assistance Bed Mobility: Supine to Sit;Sit to Supine;Rolling Rolling: Min assist   Supine to sit: Max assist Sit to supine: Mod assist   General bed mobility comments: Pt able to roll to both sides w/ inc effort, cueing, and min physical assist. Pt had more difficulty with supine-to-sit and req Max-A for BLE and trunk control. Pt able to lift own legs most of the way back into the bed sit-to-supine and mod-A for remainder of movement / repositioning in bed  Transfers Overall transfer level: Needs assistance Equipment used: Rolling walker (2 wheeled) Transfers: Sit to/from Stand Sit to Stand: Mod assist;+2 physical assistance;From elevated surface         General transfer comment: Pt req mod cueing for hand and foot positioning; primarily needed physical assist w/ initiating movement  Ambulation/Gait             General Gait Details: only able to lift each foot briefly and minimally from the floor; unable to attempt ambulation   Stairs             Wheelchair Mobility    Modified Rankin (Stroke Patients Only)       Balance Overall balance assessment: Needs assistance Sitting-balance support: Feet supported;Bilateral upper extremity supported Sitting balance-Leahy Scale: Poor Sitting balance - Comments: CGA maintain sitting balance - RLE kicking restlessly unable to stay still for static balance; req BUE support   Standing balance support: Bilateral upper extremity supported;During functional activity Standing balance-Leahy Scale: Poor Standing balance comment: Pt req x2 min A from PT for standing balance as  well as BUE support on RW. Pt had very difficult time lifting / shifting weight through BLE and only briefly able to lift each leg before requiring to sit down                            Cognition Arousal/Alertness: Awake/alert Behavior During Therapy: WFL for tasks assessed/performed Overall Cognitive Status: Impaired/Different from  baseline Area of Impairment: Memory;Following commands;Safety/judgement;Awareness;Problem solving                     Memory: Decreased short-term memory Following Commands: Follows one step commands inconsistently;Follows one step commands with increased time Safety/Judgement: Decreased awareness of safety;Decreased awareness of deficits     General Comments: Pt only oriented to self. Pt states his stroke was 2 weeks ago and that he lives with wife (phone call w/ [facility]: pt CVA in 2016, lives at [facility])      Exercises Total Joint Exercises Ankle Circles/Pumps: AROM;Both;10 reps Heel Slides: AAROM;Strengthening;Both;5 reps Hip ABduction/ADduction: AAROM;Strengthening;Both;10 reps Straight Leg Raises: AAROM;Strengthening;Both;10 reps Long Arc Quad: AROM;Strengthening;Both;10 reps;15 reps Marching in Standing: AROM;Strengthening;Both;Standing;Other (comment) (2; very briefly lifted each leg w/ significant effort)    General Comments        Pertinent Vitals/Pain Pain Assessment: No/denies pain    Home Living                      Prior Function            PT Goals (current goals can now be found in the care plan section) Progress towards PT goals: Progressing toward goals    Frequency    Min 2X/week      PT Plan Current plan remains appropriate    Co-evaluation              AM-PAC PT "6 Clicks" Mobility   Outcome Measure  Help needed turning from your back to your side while in a flat bed without using bedrails?: A Lot Help needed moving from lying on your back to sitting on the side of a flat bed without using bedrails?: A Lot Help needed moving to and from a bed to a chair (including a wheelchair)?: Total Help needed standing up from a chair using your arms (e.g., wheelchair or bedside chair)?: A Lot Help needed to walk in hospital room?: Total Help needed climbing 3-5 steps with a railing? : Total 6 Click Score: 9    End of Session  Equipment Utilized During Treatment: Gait belt Activity Tolerance: Patient tolerated treatment well Patient left: in bed;with call bell/phone within reach;with bed alarm set;with SCD's reapplied Nurse Communication: Mobility status PT Visit Diagnosis: Unsteadiness on feet (R26.81);Muscle weakness (generalized) (M62.81);Difficulty in walking, not elsewhere classified (R26.2)     Time: 4536-4680 PT Time Calculation (min) (ACUTE ONLY): 27 min  Charges:                        Riggs Dineen SPT 01/17/20, 4:11 PM

## 2020-01-17 NOTE — Care Management Important Message (Signed)
Important Message  Patient Details  Name: HERNANDEZ LOSASSO MRN: [id number] Date of Birth: 02-12-1941   Medicare Important Message Given:  Yes     Barrie Dunker, RN 01/17/2020, 11:09 AM

## 2020-01-17 NOTE — Progress Notes (Signed)
PROGRESS NOTE    David Sloan  NIO:[id number] DOB: 09-21-1940 DOA: 01/13/2020 PCP: [facility]   Assessment & Plan:   Principal Problem:   UTI (urinary tract infection) Active Problems:   GERD (gastroesophageal reflux disease)   Essential (primary) hypertension   BPH (benign prostatic hyperplasia)   Stroke (HCC)   Acute metabolic encephalopathy   HLD (hyperlipidemia)   Type II diabetes mellitus with renal manifestations (HCC)   UTI: UA is positive, urine cx shows no growth. Continue on cefdinir. Blood cxs NGTD  Sepsis: r/o. Does not meet sepsis criteria.   Leukocytosis: secondary to above. Trending down. Continue on abxs  GERD: continue on pantoprazole    HTN: will continue on home dose of amlodipine   Hx of  CVA: continue on aspirin, plavix, statin   Acute metabolic encephalopathy: CT head negative. Likely secondary to infection, UTI. Improved today, knows self, place, year   HLD: continue on statin   DM2: continue on levemir and SSI w/ accuchecks. Carb modified diet   BPH: continue on proscar & tamsulosin    DVT prophylaxis: lovenox Code Status: full  Family Communication:  Disposition Plan: likely d/c to SNF  Status is: Inpatient  Remains inpatient appropriate because: unsafe d/c plan, awaiting on insurance auth    Dispo: The patient is from: Home              Anticipated d/c is to: SNF              Anticipated d/c date is: 2 days              Patient currently is not medically stable to d/c.    Consultants:      Procedures:    Antimicrobials: cefdinir   Subjective: Pt c/o fatigue  Objective: Vitals:   01/16/20 0740 01/16/20 1520 01/17/20 0610 01/17/20 0715  BP: (!) 172/82 (!) 154/61 (!) 153/74 (!) 160/80  Pulse: 105 83 (!) 56 (!) 53  Resp: 19 17 16 18   Temp: 98.5 F (36.9 C) 98.7 F (37.1 C) 98.4 F (36.9 C) 98.4 F (36.9 C)  TempSrc: Oral Oral Oral Oral  SpO2: 100% 100% 99% 100%  Weight:      Height:         Intake/Output Summary (Last 24 hours) at 01/17/2020 0739 Last data filed at 01/17/2020 03/18/2020 Gross per 24 hour  Intake 340 ml  Output 2050 ml  Net -1710 ml   Filed Weights   01/13/20 1058  Weight: (!) 107 kg    Examination:  General exam:Appears calm and comfortable  Respiratory system: Clear breath sounds b/l. No rhonchi Cardiovascular system:S1 &S2 +. No rubs, gallops or clicks.  Gastrointestinal system:Abdomen is obese, soft and nontender. Hypoactive bowel sounds heard. Central nervous system:Alert and oriented.Moves all 4 extremities Psychiatry:Judgement and insight appear abnormal.Flat mood and affect     Data Reviewed: I have personally reviewed following labs and imaging studies  CBC: Recent Labs  Lab 01/13/20 1149 01/14/20 0505 01/15/20 0603 01/16/20 0434 01/17/20 0445  WBC 20.3* 20.6* 17.4* 13.9* 11.1*  HGB 13.3 12.3* 12.0* 12.6* 12.4*  HCT 38.6* 36.3* 35.4* 35.7* 35.5*  MCV 88.1 89.0 90.1 86.4 86.0  PLT 212 195 180 196 232   Basic Metabolic Panel: Recent Labs  Lab 01/13/20 1149 01/14/20 0505 01/15/20 0603 01/16/20 0434 01/17/20 0445  NA 133* 136 136 137 132*  K 4.3 3.8 3.9 3.8 3.8  CL 101 103 104 102 97*  CO2 25 25 23  29 26  GLUCOSE 96 86 117* 151* 110*  BUN 19 15 11 12 13   CREATININE 0.87 0.80 0.70 0.71 0.70  CALCIUM 9.3 9.0 8.9 8.8* 9.0   GFR: Estimated Creatinine Clearance: 90.2 mL/min (by C-G formula based on SCr of 0.7 mg/dL). Liver Function Tests: Recent Labs  Lab 01/13/20 1149  AST 20  ALT 19  ALKPHOS 58  BILITOT 0.8  PROT 6.8  ALBUMIN 3.8   No results for input(s): LIPASE, AMYLASE in the last 168 hours. No results for input(s): AMMONIA in the last 168 hours. Coagulation Profile: Recent Labs  Lab 01/13/20 1424  INR 1.0   Cardiac Enzymes: No results for input(s): CKTOTAL, CKMB, CKMBINDEX, TROPONINI in the last 168 hours. BNP (last 3 results) No results for input(s): PROBNP in the last 8760 hours. HbA1C: No  results for input(s): HGBA1C in the last 72 hours. CBG: Recent Labs  Lab 01/16/20 0743 01/16/20 1346 01/16/20 1716 01/16/20 2130 01/17/20 0720  GLUCAP 115* 204* 226* 163* 78   Lipid Profile: No results for input(s): CHOL, HDL, LDLCALC, TRIG, CHOLHDL, LDLDIRECT in the last 72 hours. Thyroid Function Tests: No results for input(s): TSH, T4TOTAL, FREET4, T3FREE, THYROIDAB in the last 72 hours. Anemia Panel: No results for input(s): VITAMINB12, FOLATE, FERRITIN, TIBC, IRON, RETICCTPCT in the last 72 hours. Sepsis Labs: Recent Labs  Lab 01/13/20 1312 01/13/20 2036 01/13/20 2256  LATICACIDVEN 1.1 1.1 1.0    Recent Results (from the past 240 hour(s))  Urine culture     Status: None   Collection Time: 01/13/20 12:58 PM   Specimen: Urine, Catheterized  Result Value Ref Range Status   Specimen Description   Final    URINE, CATHETERIZED Performed at [facility], [address], [place], [place]    Special Requests   Final    Normal Performed at [facility], [address] [place], [place]    Culture   Final    NO GROWTH Performed at [facility], [address], [place], [place]    Report Status 01/15/2020 FINAL  Final  Blood culture (routine x 2)     Status: None (Preliminary result)   Collection Time: 01/13/20  1:12 PM   Specimen: BLOOD  Result Value Ref Range Status   Specimen Description BLOOD BLOOD RIGHT FOREARM  Final   Special Requests   Final    BOTTLES DRAWN AEROBIC AND ANAEROBIC Blood Culture adequate volume   Culture   Final    NO GROWTH 4 DAYS Performed at [facility], [address] [place], [place]    Report Status PENDING  Incomplete  Blood culture (routine x 2)     Status: None (Preliminary result)   Collection Time: 01/13/20  1:12 PM   Specimen: BLOOD  Result Value Ref Range Status   Specimen Description BLOOD RIGHT ANTECUBITAL  Final   Special Requests   Final     BOTTLES DRAWN AEROBIC AND ANAEROBIC Blood Culture adequate volume   Culture   Final    NO GROWTH 4 DAYS Performed at [facility], [address], [place], [place]    Report Status PENDING  Incomplete  SARS Coronavirus 2 by RT PCR (hospital order, performed in [facility] hospital lab) Nasopharyngeal Nasopharyngeal Swab     Status: None   Collection Time: 01/13/20  1:12 PM   Specimen: Nasopharyngeal Swab  Result Value Ref Range Status   SARS Coronavirus 2 NEGATIVE NEGATIVE  Final    Comment: (NOTE) SARS-CoV-2 target nucleic acids are NOT DETECTED.  The SARS-CoV-2 RNA is generally detectable in upper and lower respiratory specimens during the acute phase of infection. The lowest concentration of SARS-CoV-2 viral copies this assay can detect is 250 copies / mL. A negative result does not preclude SARS-CoV-2 infection and should not be used as the sole basis for treatment or other patient management decisions.  A negative result may occur with improper specimen collection / handling, submission of specimen other than nasopharyngeal swab, presence of viral mutation(s) within the areas targeted by this assay, and inadequate number of viral copies (<250 copies / mL). A negative result must be combined with clinical observations, patient history, and epidemiological information.  Fact Sheet for Patients:   BoilerBrush.com.cy  Fact Sheet for Healthcare Providers: https://pope.com/  This test is not yet approved or  cleared by the [place] FDA and has been authorized for detection and/or diagnosis of SARS-CoV-2 by FDA under an Emergency Use Authorization (EUA).  This EUA will remain in effect (meaning this test can be used) for the duration of the COVID-19 declaration under Section 564(b)(1) of the Act, 21 U.S.C. section 360bbb-3(b)(1), unless the authorization is terminated or revoked sooner.  Performed at [facility], [address] [place], [place] [address]          Radiology Studies: No results found.      Scheduled Meds: . amLODipine  5 mg Oral Daily  . aspirin  81 mg Oral Daily  . atorvastatin  40 mg Oral Daily  . cefdinir  300 mg Oral Q12H  . Chlorhexidine Gluconate Cloth  6 each Topical Daily  . cholecalciferol  1,000 Units Oral Daily  . clopidogrel  75 mg Oral Daily  . diclofenac Sodium  4 g Topical BID  . divalproex  125 mg Oral Daily  . enoxaparin (LOVENOX) injection  40 mg Subcutaneous Q24H  . finasteride  5 mg Oral Daily  . gabapentin  100 mg Oral BID  . insulin aspart  0-9 Units Subcutaneous TID WC  . insulin detemir  20 Units Subcutaneous q morning - 10a  . insulin detemir  4 Units Subcutaneous QHS  . melatonin  10 mg Oral QHS  . mirabegron ER  50 mg Oral Daily  .  morphine injection  1 mg Intravenous Once  . multivitamin  5 mL Oral Daily  . pantoprazole  40 mg Oral Daily  . sertraline  25 mg Oral Daily  . sodium chloride flush  3 mL Intravenous Once  . tamsulosin  0.4 mg Oral Daily   Continuous Infusions:    LOS: 3 days    Time spent: 30 mins    Charise Killian, MD [facility] Pager [phone]  If 7PM-7AM, please contact night-coverage [email or web address] 01/17/2020, 7:39 AM

## 2020-01-18 DIAGNOSIS — E1165 Type 2 diabetes mellitus with hyperglycemia: Secondary | ICD-10-CM

## 2020-01-18 DIAGNOSIS — N401 Enlarged prostate with lower urinary tract symptoms: Secondary | ICD-10-CM

## 2020-01-18 DIAGNOSIS — E785 Hyperlipidemia, unspecified: Secondary | ICD-10-CM

## 2020-01-18 LAB — BASIC METABOLIC PANEL
Anion gap: 8 (ref 5–15)
BUN: 13 mg/dL (ref 8–23)
CO2: 29 mmol/L (ref 22–32)
Calcium: 9.4 mg/dL (ref 8.9–10.3)
Chloride: 97 mmol/L — ABNORMAL LOW (ref 98–111)
Creatinine, Ser: 0.62 mg/dL (ref 0.61–1.24)
GFR calc Af Amer: >60 mL/min (ref >60)
GFR calc non Af Amer: >60 mL/min (ref >60)
Glucose, Bld: 128 mg/dL — ABNORMAL HIGH (ref 70–99)
Potassium: 4 mmol/L (ref 3.5–5.1)
Sodium: 134 mmol/L — ABNORMAL LOW (ref 135–145)

## 2020-01-18 LAB — CBC
HCT: 37.7 % — ABNORMAL LOW (ref 39.0–52.0)
Hemoglobin: 13 g/dL (ref 13.0–17.0)
MCH: 30.2 pg (ref 26.0–34.0)
MCHC: 34.5 g/dL (ref 30.0–36.0)
MCV: 87.7 fL (ref 80.0–100.0)
Platelets: 260 10*3/uL (ref 150–400)
RBC: 4.3 MIL/uL (ref 4.22–5.81)
RDW: 12.9 % (ref 11.5–15.5)
WBC: 9.7 10*3/uL (ref 4.0–10.5)
nRBC: 0 % (ref 0.0–0.2)

## 2020-01-18 LAB — CULTURE, BLOOD (ROUTINE X 2)
Culture: NO GROWTH
Culture: NO GROWTH
Special Requests: ADEQUATE
Special Requests: ADEQUATE

## 2020-01-18 LAB — GLUCOSE, CAPILLARY
Glucose-Capillary: 126 mg/dL — ABNORMAL HIGH (ref 70–99)
Glucose-Capillary: 190 mg/dL — ABNORMAL HIGH (ref 70–99)

## 2020-01-18 MED ORDER — CEFDINIR 300 MG PO CAPS: 300.0000 mg | ORAL_CAPSULE | Freq: Two times a day (BID) | ORAL | 0 refills | Status: DC

## 2020-01-18 MED ORDER — INSULIN NPH (HUMAN) (ISOPHANE) 100 UNIT/ML ~~LOC~~ SUSP: 15.0000 [IU] | Freq: Two times a day (BID) | SUBCUTANEOUS | 11 refills | Status: AC

## 2020-01-18 MED ORDER — BISACODYL 10 MG RE SUPP
10.0000 mg | Freq: Once | RECTAL | Status: AC
  Administered 2020-01-18: 10 mg via RECTAL
  Filled 2020-01-18: qty 1

## 2020-01-18 MED ORDER — SENNOSIDES-DOCUSATE SODIUM 8.6-50 MG PO TABS: 1.0000 | ORAL_TABLET | Freq: Two times a day (BID) | ORAL | Status: AC | PRN

## 2020-01-18 MED ORDER — POLYETHYLENE GLYCOL 3350 17 G PO PACK: 17.0000 g | PACK | Freq: Two times a day (BID) | ORAL | 0 refills | Status: DC | PRN

## 2020-01-18 MED ORDER — SENNOSIDES-DOCUSATE SODIUM 8.6-50 MG PO TABS: 1.0000 | ORAL_TABLET | Freq: Two times a day (BID) | ORAL | Status: DC | PRN

## 2020-01-18 MED ORDER — POLYETHYLENE GLYCOL 3350 17 G PO PACK: 17.0000 g | PACK | Freq: Two times a day (BID) | ORAL | Status: DC | PRN

## 2020-01-18 NOTE — TOC Progression Note (Signed)
Transition of Care [facility]) - Progression Note    Patient Details  Name: David Sloan MRN: [id number] Date of Birth: July 30, 1940  [facility] CM/SW Contact  Barrie Dunker, RN Phone Number: 01/18/2020, 2:24 PM  Clinical Narrative:    TOC CM called EMS to transport to room 105 at [facility]        Expected Discharge Plan and Services           Expected Discharge Date: 01/18/20                                     Social Determinants of Health (SDOH) Interventions    Readmission Risk Interventions No flowsheet data found.

## 2020-01-18 NOTE — Discharge Summary (Addendum)
Physician Discharge Summary  David Sloan VPX:[id number] DOB: 12/12/1940 DOA: 01/13/2020  PCP: Almetta Lovely, [facility]  Admit date: 01/13/2020 Discharge date: 01/18/2020  Admitted From: SNF Disposition: SNF  Recommendations for Outpatient Follow-up:  1. Follow up with PCP in 1 week 2. Please obtain CBC/BMP/Mag at follow up 3. Please follow up on the following pending results: None  Discharge Condition: Stable CODE STATUS: Full code   Contact information for after-discharge care    Destination    [facility] Preferred SNF .   Service: Skilled Nursing Contact information: [address] [place] [address] [phone]                  HPI: Per Dr. Lorretta Harp "79 y.o. male with medical history significant of hypertension, hyperlipidemia, diabetes mellitus, stroke with right-sided weakness,, GERD, OSA, BPH, chronic pain, who presents with altered mental status.  Per her daughter, she saw her father last night when pt was at normal baseline health condition.  This morning, patient was noted to be confused by staff in the nursing home.  Patient has dry cough, congestion and keeps clearing his throat.  No chest pain, fever or chills.  Patient does not have nausea vomiting, diarrhea, abdominal pain.  Patient had positive urinalysis test yesterday in the facility.  ED Course: pt was found to have positive urinalysis (cloudy appearance, large amount of leukocyte, many bacteria, WBC> 50), negative COVID-19 PCR, electrolytes renal function okay, WBC 20.3, lactic acid 1.1, INR 1.0, temperature normal, blood pressure 98/58, heart rate 69, RR 20.  Chest x-ray negative.  CT head negative for acute intracranial abnormalities patient is placed on [facility] for observation"  Hospital Course: 79 year old male with PMH of dementia, CVA, HTN, DM-2, HLD, OSA, BPH, chronic pain and GERD admitted with acute metabolic encephalopathy presumed to be due to UTI.   UA with large leukocytes and many bacteria but urine culture was negative.  He had WBC to 20.3 but no lactic acidosis.  CXR and CT head negative for acute finding.  He was a started on IV antibiotics and admitted for presumed UTI.  His blood culture and urine culture were negative.  His mental status improved and almost back to baseline per patient's daughter.  Evaluated by PT/OT who recommended SNF.  Patient will be returning to SNF.  He will continue cefdinir for 3 more days to complete treatment course for presumed UTI.  We have also discontinued Benadryl and Mucinex DM which could contribute to his encephalopathy.  See individual problem list below for more hospital course.  Discharge Diagnoses:  Acute metabolic encephalopathy likely due to UTI: Resolving.  CT head negative for acute finding.  He is oriented to self place and family.  Per daughter, he is almost back to baseline. -Treat UTI as below -Reorientation and delirium precautions. -Avoid sedating medications.  Urinary tract infection: POA.  Difficult diagnosis as patient could not provide clear history.  However, has had leukocytosis with concerning UA presentation.  Surprisingly, urine culture and blood culture negative.  Improved with antibiotics.  Discharged on cefdinir for 3 more days to complete treatment course.  Sepsis: Ruled out.  Leukocytosis: secondary to above.  Resolved.  GERD: continue on pantoprazole    HTN: will continue on home dose of amlodipine   Hx of  CVA: continue on aspirin, plavix, statin   HLD: continue on statin   DM2: Reduce Levemir to 15 units twice daily.  Continue Metformin and Tradjenta.  Carb  modified diet.  BPH/urinary incontinence:  -Continue home Myrbetriq, Proscar and tamsulosin   Body mass index is 36.95 kg/m.            Discharge Exam: Vitals:   01/18/20 0051 01/18/20 0728  BP: (!) 154/65 114/75  Pulse: 64 62  Resp: 16 15  Temp: 98.6 F (37 C) 98.1 F (36.7 C)   SpO2: 100% 98%    GENERAL: No apparent distress.  Nontoxic. HEENT: MMM.  Vision and hearing grossly intact.  NECK: Supple.  No apparent JVD.  RESP: On RA.  No IWOB.  Fair aeration bilaterally. CVS:  RRR. Heart sounds normal.  ABD/GI/GU: Bowel sounds present. Soft. Non tender.  MSK/EXT:  Moves extremities. No apparent deformity. No edema.  SKIN: no apparent skin lesion or wound NEURO: Awake, alert and oriented self, place and person.  No apparent focal neuro deficit other than some BLE weakness PSYCH: Calm. Normal affect.  Discharge Instructions  Discharge Instructions    Diet - low sodium heart healthy   Complete by: As directed    Diet Carb Modified   Complete by: As directed    Increase activity slowly   Complete by: As directed      Allergies as of 01/18/2020      Reactions   Losartan Potassium Swelling   Tamsulosin Hcl Swelling      Medication List    STOP taking these medications   diphenhydrAMINE 25 mg capsule Commonly known as: BENADRYL   fesoterodine 4 MG Tb24 tablet Commonly known as: TOVIAZ   guaiFENesin-dextromethorphan 100-10 MG/5ML syrup Commonly known as: ROBITUSSIN DM     TAKE these medications   amLODipine 5 MG tablet Commonly known as: NORVASC Take 5 mg by mouth daily.   aspirin 81 MG chewable tablet Chew 1 tablet (81 mg total) by mouth daily.   atorvastatin 40 MG tablet Commonly known as: LIPITOR Take 40 mg by mouth daily.   cefdinir 300 MG capsule Commonly known as: OMNICEF Take 1 capsule (300 mg total) by mouth every 12 (twelve) hours.   cholecalciferol 25 MCG (1000 UNIT) tablet Commonly known as: VITAMIN D Take 1,000 Units by mouth daily.   clopidogrel 75 MG tablet Commonly known as: PLAVIX Take 75 mg by mouth daily.   divalproex 125 MG capsule Commonly known as: DEPAKOTE SPRINKLE Take 125 mg by mouth daily.   famotidine 20 MG tablet Commonly known as: PEPCID Take 1 tablet (20 mg total) by mouth 2 (two) times daily for 3  days.   finasteride 5 MG tablet Commonly known as: PROSCAR Take 5 mg by mouth daily.   gabapentin 100 MG capsule Commonly known as: NEURONTIN Take 100 mg by mouth 2 (two) times daily.   insulin NPH Human 100 UNIT/ML injection Commonly known as: HumuLIN N Inject 0.15 mLs (15 Units total) into the skin 2 (two) times daily before a meal. What changed:   how much to take  how to take this  when to take this  additional instructions   linagliptin 5 MG Tabs tablet Commonly known as: TRADJENTA Take 5 mg by mouth daily.   melatonin 3 MG Tabs tablet Take 9 mg by mouth at bedtime.   metFORMIN 500 MG 24 hr tablet Commonly known as: GLUCOPHAGE-XR Take 500 mg by mouth daily with breakfast.   mirabegron ER 50 MG Tb24 tablet Commonly known as: Myrbetriq Take 1 tablet (50 mg total) by mouth daily.   multivitamin Liqd Take 5 mLs by mouth daily.   pantoprazole  40 MG tablet Commonly known as: PROTONIX Take 40 mg by mouth daily.   polyethylene glycol 17 g packet Commonly known as: MIRALAX / GLYCOLAX Take 17 g by mouth 2 (two) times daily as needed for mild constipation.   senna-docusate 8.6-50 MG tablet Commonly known as: Senokot-S Take 1 tablet by mouth 2 (two) times daily as needed for moderate constipation.   sertraline 25 MG tablet Commonly known as: ZOLOFT Take 25 mg by mouth daily.   tamsulosin 0.4 MG Caps capsule Commonly known as: FLOMAX Take 0.4 mg by mouth daily.   Voltaren 1 % Gel Generic drug: diclofenac Sodium Apply 4 g topically 2 (two) times daily.       Consultations:  None  Procedures/Studies:   CT Head Wo Contrast  Result Date: 01/13/2020 CLINICAL DATA:  Mental status change. EXAM: CT HEAD WITHOUT CONTRAST TECHNIQUE: Contiguous axial images were obtained from the base of the skull through the vertex without intravenous contrast. COMPARISON:  02/15/2018 head CT and MRI. FINDINGS: Brain: Mild diffuse parenchymal volume loss with ex vacuo  dilatation. Sequela of remote left frontal and left occipital lobe insults. Sequela of remote right centrum semiovale and left thalamic insults. Background scattered and confluent hypodense foci involving the periventricular and subcortical white matter are grossly unchanged. These are nonspecific however commonly associated with chronic microvascular ischemic changes. No new focal hypodensity to suggest acute infarct. No intracranial hemorrhage. No midline shift, mass lesion or extra-axial fluid collection. Vascular: No hyperdense vessel. Bilateral carotid siphon and V4 segment atherosclerotic calcifications. Skull: No acute osseous abnormality. Sinuses/Orbits: No acute orbital finding. Mild ethmoid and left maxillary sinus mucosal thickening. No mastoid effusion. Other: None. IMPRESSION: No acute intracranial process. Sequela of remote insults involving the right centrum semiovale, left frontal and occipital regions and left thalamus. Mild cerebral atrophy. Advanced chronic microvascular ischemic changes. Electronically Signed   By: Stana Bunting M.D.   On: 01/13/2020 13:07   DG Chest Portable 1 View  Result Date: 01/13/2020 CLINICAL DATA:  Sepsis EXAM: PORTABLE CHEST 1 VIEW COMPARISON:  2019 FINDINGS: The heart size and mediastinal contours are within normal limits. Low lung volumes. No new consolidation or edema. No pleural effusion. IMPRESSION: No acute process in the chest. Electronically Signed   By: Guadlupe Spanish M.D.   On: 01/13/2020 14:09       The results of significant diagnostics from this hospitalization (including imaging, microbiology, ancillary and laboratory) are listed below for reference.     Microbiology: Recent Results (from the past 240 hour(s))  Urine culture     Status: None   Collection Time: 01/13/20 12:58 PM   Specimen: Urine, Catheterized  Result Value Ref Range Status   Specimen Description   Final    URINE, CATHETERIZED Performed at [facility],  [address] [place], [place] [address]    Special Requests   Final    Normal Performed at [facility], [address], [place], [place] [address]    Culture   Final    NO GROWTH Performed at [facility], [address], [place], [place] [address]    Report Status 01/15/2020 FINAL  Final  Blood culture (routine x 2)     Status: None   Collection Time: 01/13/20  1:12 PM   Specimen: BLOOD  Result Value Ref Range Status   Specimen Description BLOOD BLOOD RIGHT FOREARM  Final   Special Requests   Final    BOTTLES DRAWN AEROBIC AND ANAEROBIC Blood Culture  adequate volume   Culture   Final    NO GROWTH 5 DAYS Performed at [facility], [address] [place], [place] [address]    Report Status 01/18/2020 FINAL  Final  Blood culture (routine x 2)     Status: None   Collection Time: 01/13/20  1:12 PM   Specimen: BLOOD  Result Value Ref Range Status   Specimen Description BLOOD RIGHT ANTECUBITAL  Final   Special Requests   Final    BOTTLES DRAWN AEROBIC AND ANAEROBIC Blood Culture adequate volume   Culture   Final    NO GROWTH 5 DAYS Performed at [facility], [address] [place], [place] [address]    Report Status 01/18/2020 FINAL  Final  SARS Coronavirus 2 by RT PCR (hospital order, performed in [facility] hospital lab) Nasopharyngeal Nasopharyngeal Swab     Status: None   Collection Time: 01/13/20  1:12 PM   Specimen: Nasopharyngeal Swab  Result Value Ref Range Status   SARS Coronavirus 2 NEGATIVE NEGATIVE Final    Comment: (NOTE) SARS-CoV-2 target nucleic acids are NOT DETECTED.  The SARS-CoV-2 RNA is generally detectable in upper and lower respiratory specimens during the acute phase of infection. The lowest concentration of SARS-CoV-2 viral copies this assay can detect is 250 copies / mL. A negative result does not preclude SARS-CoV-2 infection and should not be used as the sole basis for treatment or other patient  management decisions.  A negative result may occur with improper specimen collection / handling, submission of specimen other than nasopharyngeal swab, presence of viral mutation(s) within the areas targeted by this assay, and inadequate number of viral copies (<250 copies / mL). A negative result must be combined with clinical observations, patient history, and epidemiological information.  Fact Sheet for Patients:   BoilerBrush.com.cy  Fact Sheet for Healthcare Providers: https://pope.com/  This test is not yet approved or  cleared by the [place] FDA and has been authorized for detection and/or diagnosis of SARS-CoV-2 by FDA under an Emergency Use Authorization (EUA).  This EUA will remain in effect (meaning this test can be used) for the duration of the COVID-19 declaration under Section 564(b)(1) of the Act, 21 U.S.C. section 360bbb-3(b)(1), unless the authorization is terminated or revoked sooner.  Performed at [facility], [address] [place], [place] [address]      Labs: BNP (last 3 results) No results for input(s): BNP in the last 8760 hours. Basic Metabolic Panel: Recent Labs  Lab 01/14/20 0505 01/15/20 0603 01/16/20 0434 01/17/20 0445 01/18/20 0552  NA 136 136 137 132* 134*  K 3.8 3.9 3.8 3.8 4.0  CL 103 104 102 97* 97*  CO2 25 23 29 26 29   GLUCOSE 86 117* 151* 110* 128*  BUN 15 11 12 13 13   CREATININE 0.80 0.70 0.71 0.70 0.62  CALCIUM 9.0 8.9 8.8* 9.0 9.4   Liver Function Tests: Recent Labs  Lab 01/13/20 1149  AST 20  ALT 19  ALKPHOS 58  BILITOT 0.8  PROT 6.8  ALBUMIN 3.8   No results for input(s): LIPASE, AMYLASE in the last 168 hours. No results for input(s): AMMONIA in the last 168 hours. CBC: Recent Labs  Lab 01/14/20 0505 01/15/20 0603 01/16/20 0434 01/17/20 0445 01/18/20 0552  WBC 20.6* 17.4* 13.9* 11.1* 9.7  HGB 12.3* 12.0* 12.6* 12.4* 13.0  HCT 36.3* 35.4* 35.7*  35.5* 37.7*  MCV 89.0 90.1 86.4 86.0 87.7  PLT 195 180 196 232 260  Cardiac Enzymes: No results for input(s): CKTOTAL, CKMB, CKMBINDEX, TROPONINI in the last 168 hours. BNP: Invalid input(s): POCBNP CBG: Recent Labs  Lab 01/17/20 0720 01/17/20 1143 01/17/20 1622 01/17/20 2110 01/18/20 0730  GLUCAP 78  78 171* 108* 144* 126*   D-Dimer No results for input(s): DDIMER in the last 72 hours. Hgb A1c No results for input(s): HGBA1C in the last 72 hours. Lipid Profile No results for input(s): CHOL, HDL, LDLCALC, TRIG, CHOLHDL, LDLDIRECT in the last 72 hours. Thyroid function studies No results for input(s): TSH, T4TOTAL, T3FREE, THYROIDAB in the last 72 hours.  Invalid input(s): FREET3 Anemia work up No results for input(s): VITAMINB12, FOLATE, FERRITIN, TIBC, IRON, RETICCTPCT in the last 72 hours. Urinalysis    Component Value Date/Time   COLORURINE YELLOW (A) 01/13/2020 1500   APPEARANCEUR CLOUDY (A) 01/13/2020 1500   APPEARANCEUR Clear 12/07/2018 1323   LABSPEC 1.025 01/13/2020 1500   PHURINE 6.0 01/13/2020 1500   GLUCOSEU NEGATIVE 01/13/2020 1500   HGBUR SMALL (A) 01/13/2020 1500   BILIRUBINUR NEGATIVE 01/13/2020 1500   BILIRUBINUR Negative 12/07/2018 1323   KETONESUR NEGATIVE 01/13/2020 1500   PROTEINUR 30 (A) 01/13/2020 1500   NITRITE NEGATIVE 01/13/2020 1500   LEUKOCYTESUR LARGE (A) 01/13/2020 1500   Sepsis Labs Invalid input(s): PROCALCITONIN,  WBC,  LACTICIDVEN   Time coordinating discharge: 45 minutes  SIGNED:  Almon Herculesaye T Srihith Aquilino, MD  [facility] 01/18/2020, 12:00 PM  If 7PM-7AM, please contact night-coverage [email or web address] Password [email or web address]

## 2020-01-18 NOTE — Progress Notes (Signed)
Pt ready for discharge to SNF per MD. Pt assessment unchanged from this morning. Report was attempted to [facility] but was unable to reach the nurse each time. My number was left with the receptionist. EMS transportation set up for pt by CM. PIV removed, VSS.   Shelbie Ammons

## 2020-01-18 NOTE — TOC Progression Note (Signed)
Transition of Care [facility]) - Progression Note    Patient Details  Name: David Sloan MRN: [id number] Date of Birth: 06/14/1941  [facility] CM/SW Contact  Barrie Dunker, RN Phone Number: 01/18/2020, 12:12 PM  Clinical Narrative:   DC packet on the chart Bedside nurse to call report then [facility] CM to call EMS The Physician contacted the Daughter Felecia and notified of the DC,          Expected Discharge Plan and Services           Expected Discharge Date: 01/18/20                                     Social Determinants of Health (SDOH) Interventions    Readmission Risk Interventions No flowsheet data found.

## 2020-02-11 ENCOUNTER — Emergency Department
Admission: EM | Admit: 2020-02-11 | Discharge: 2020-02-11 | Disposition: A | Discharge status: Home / Self Care | Payer: Medicare Other | Attending: Student in an Organized Health Care Education/Training Program | Admitting: Student in an Organized Health Care Education/Training Program

## 2020-02-11 ENCOUNTER — Other Ambulatory Visit: Payer: Self-pay

## 2020-02-11 ENCOUNTER — Emergency Department: Payer: Medicare Other

## 2020-02-11 ENCOUNTER — Encounter: Payer: Self-pay | Admitting: Emergency Medicine

## 2020-02-11 DIAGNOSIS — Y9289 Other specified places as the place of occurrence of the external cause: Secondary | ICD-10-CM | POA: Diagnosis not present

## 2020-02-11 DIAGNOSIS — S0990XA Unspecified injury of head, initial encounter: Secondary | ICD-10-CM | POA: Diagnosis not present

## 2020-02-11 DIAGNOSIS — Z87891 Personal history of nicotine dependence: Secondary | ICD-10-CM | POA: Insufficient documentation

## 2020-02-11 DIAGNOSIS — Z23 Encounter for immunization: Secondary | ICD-10-CM | POA: Diagnosis not present

## 2020-02-11 DIAGNOSIS — Y939 Activity, unspecified: Secondary | ICD-10-CM | POA: Insufficient documentation

## 2020-02-11 DIAGNOSIS — E1140 Type 2 diabetes mellitus with diabetic neuropathy, unspecified: Secondary | ICD-10-CM | POA: Insufficient documentation

## 2020-02-11 DIAGNOSIS — Y999 Unspecified external cause status: Secondary | ICD-10-CM | POA: Diagnosis not present

## 2020-02-11 DIAGNOSIS — Z794 Long term (current) use of insulin: Secondary | ICD-10-CM | POA: Diagnosis not present

## 2020-02-11 DIAGNOSIS — E1136 Type 2 diabetes mellitus with diabetic cataract: Secondary | ICD-10-CM | POA: Insufficient documentation

## 2020-02-11 DIAGNOSIS — Z7982 Long term (current) use of aspirin: Secondary | ICD-10-CM | POA: Diagnosis not present

## 2020-02-11 DIAGNOSIS — Z79899 Other long term (current) drug therapy: Secondary | ICD-10-CM | POA: Insufficient documentation

## 2020-02-11 DIAGNOSIS — X58XXXA Exposure to other specified factors, initial encounter: Secondary | ICD-10-CM | POA: Insufficient documentation

## 2020-02-11 DIAGNOSIS — I10 Essential (primary) hypertension: Secondary | ICD-10-CM | POA: Diagnosis not present

## 2020-02-11 DIAGNOSIS — S0101XA Laceration without foreign body of scalp, initial encounter: Secondary | ICD-10-CM | POA: Diagnosis not present

## 2020-02-11 DIAGNOSIS — E1159 Type 2 diabetes mellitus with other circulatory complications: Secondary | ICD-10-CM | POA: Insufficient documentation

## 2020-02-11 DIAGNOSIS — E039 Hypothyroidism, unspecified: Secondary | ICD-10-CM | POA: Insufficient documentation

## 2020-02-11 MED ORDER — TETANUS-DIPHTH-ACELL PERTUSSIS 5-2.5-18.5 LF-MCG/0.5 IM SUSP
0.5000 mL | Freq: Once | INTRAMUSCULAR | Status: AC
  Administered 2020-02-11: 0.5 mL via INTRAMUSCULAR
  Filled 2020-02-11: qty 0.5

## 2020-02-11 MED ORDER — LIDOCAINE-EPINEPHRINE-TETRACAINE (LET) TOPICAL GEL
3.0000 mL | Freq: Once | TOPICAL | Status: AC
  Administered 2020-02-11: 3 mL via TOPICAL
  Filled 2020-02-11: qty 3

## 2020-02-11 NOTE — ED Provider Notes (Signed)
[facility] Emergency Department Provider Note    First MD Initiated Contact with Patient 02/11/20 [phone]     (approximate)  I have reviewed the triage vital signs and the nursing notes.   HISTORY  Chief Complaint head injury    HPI David Sloan is a 79 y.o. male   below listed past medical history presents from [facility] after minor head injury.  Patient reportedly was walking around was looking in drawers and cabinets for some food this morning and hit his head on one of the cabinets.  There is no LOC.  He is complaining of some stiff neck.  He is on Plavix.  Denies any other complaints or discomfort.   Past Medical History:  Diagnosis Date  . Atypical chest pain    a. 04/2013 MV: nl EF, no ischemia.  Marland Kitchen BPH (benign prostatic hyperplasia)   . Chronic back pain   . Diabetes mellitus without complication (HCC)   . Diabetic neuropathy (HCC)   . Essential hypertension   . Falls   . GERD (gastroesophageal reflux disease)   . Memory disorder   . OSA (obstructive sleep apnea)   . Osteoarthritis    a. s/p bilat knee surgeries.  . Sinus bradycardia   . Stroke [facility])    a. ~ 2002 - no residual deficits;  b. 03/2015 Head CT: prior old infarcts and foci of small vesel dzs in pons bilat. ? bilat occipital lobe acute infarcts R>L.  Marland Kitchen Urinary incontinence    Family History  Problem Relation Age of Onset  . Diabetes Mother   . Alzheimer's disease Father    Past Surgical History:  Procedure Laterality Date  . JOINT REPLACEMENT     bilateral knee replacements   Patient Active Problem List   Diagnosis Date Noted  . UTI (urinary tract infection) 01/13/2020  . Acute metabolic encephalopathy 01/13/2020  . HLD (hyperlipidemia) 01/13/2020  . Type II diabetes mellitus with renal manifestations (HCC) 01/13/2020  . Sepsis (HCC) 01/13/2020  . Angioedema 04/22/2018  . Long term current use of opiate analgesic 03/04/2018  . Nontraumatic intracerebral  hemorrhage (HCC) 02/09/2018  . Chronic pain syndrome 09/23/2017  . Hyperlipidemia, mixed 06/24/2017  . BPH (benign prostatic hyperplasia) 06/24/2017  . History of CVA (cerebrovascular accident) 06/24/2017  . Primary open angle glaucoma (POAG) of both eyes 06/24/2017  . Controlled type 2 diabetes mellitus without complication, with long-term current use of insulin (HCC) 06/24/2017  . 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 Pain of left femur 09/29/2016  . Pain medication agreement signed 08/05/2016  . Fall 10/03/2015  . Gait instability 10/03/2015  . TIA (transient ischemic attack) 10/03/2015  . Acute CVA (cerebrovascular accident) (HCC) 10/03/2015  . CVA (cerebral infarction) 10/03/2015  . Risk for falls 07/30/2015  . S/P cholecystectomy 07/30/2015  . Diabetic neuropathy (HCC) 07/17/2015  . Scoliosis 04/13/2015  . Fibrositis 04/13/2015  . Slurred speech 03/31/2015  . Elevated transaminase level 03/31/2015  . Gallstones 03/31/2015  . Lower extremity weakness 03/30/2015  . Cerebral infarction (HCC)   . Elevated troponin   . Uncontrolled type 2 diabetes mellitus with circulatory disorder (HCC)   . EKG abnormalities   . Emesis   . Gall bladder disease   . Other long term (current) drug therapy 02/13/2015  . Essential (primary) hypertension 09/18/2014  . Right sciatic nerve pain 09/15/2014  . Bradycardia 09/13/2014  . Cognitive disorder 04/05/2014  . Chronic pain associated with significant psychosocial dysfunction 01/17/2014  .  Back ache 01/17/2014  . Esophageal stenosis 11/30/2013  . Can't get food down 11/30/2013  . Esophageal stricture 11/30/2013  . Chronic, continuous use of opioids 07/06/2013  . Cerebrovascular accident (CVA) (HCC) 05/19/2013  . Hypothyroid 05/19/2013  . GERD (gastroesophageal reflux disease) 05/19/2013  . Stroke (HCC) 05/19/2013  . Gastroesophageal reflux disease without esophagitis 05/19/2013  . Spondylosis of lumbar region without myelopathy  or radiculopathy 01/11/2013  . SI (sacroiliac) pain 10/06/2012  . Osteoarthritis 10/06/2012  . Obstructive sleep apnea 10/06/2012  . Lumbar degenerative disc disease 10/06/2012  . Facet arthritis of lumbar region 10/06/2012  . Diabetes mellitus (HCC) 10/06/2012  . Diabetes (HCC) 10/06/2012  . Punctate keratitis 01/21/2012  . 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HCC) 01/21/2012  . Type 2 diabetes mellitus (HCC) 10/16/2010  . Tear film insufficiency 10/16/2010  . Senile corneal changes 10/16/2010  . Primary open-angle glaucoma 10/16/2010  . Moderate stage glaucoma 10/16/2010  . Background diabetic retinopathy (HCC) 10/16/2010  . 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 Authorizing Provider  amLODipine (NORVASC) 5 MG tablet Take 5 mg by mouth daily. 06/23/19   [provider]  aspirin 81 MG chewable tablet Chew 1 tablet (81 mg total) by mouth daily. 10/04/15   Alford Highland, MD  atorvastatin (LIPITOR) 40 MG tablet Take 40 mg by mouth daily.    [provider]  cefdinir (OMNICEF) 300 MG capsule Take 1 capsule (300 mg total) by mouth every 12 (twelve) hours. 01/18/20   Almon Hercules, MD  cholecalciferol (VITAMIN D) 1000 units tablet Take 1,000 Units by mouth daily.    [provider]  clopidogrel (PLAVIX) 75 MG tablet Take 75 mg by mouth daily.    [provider]  diclofenac Sodium (VOLTAREN) 1 % GEL Apply 4 g topically 2 (two) times daily.    [provider]  divalproex (DEPAKOTE SPRINKLE) 125 MG capsule Take 125 mg by mouth daily.    [provider]  famotidine (PEPCID) 20 MG tablet Take 1 tablet (20 mg total) by mouth 2 (two) times daily for 3 days. 04/24/18 04/27/18  Shaune Pollack, MD  finasteride (PROSCAR) 5 MG tablet Take 5 mg by mouth daily. 06/27/19   [provider]  gabapentin (NEURONTIN) 100 MG capsule Take 100 mg by mouth 2  (two) times daily.  06/30/19   [provider]  insulin NPH Human (HUMULIN N) 100 UNIT/ML injection Inject 0.15 mLs (15 Units total) into the skin 2 (two) times daily before a meal. 01/18/20   Gonfa, Taye T, MD  linagliptin (TRADJENTA) 5 MG TABS tablet Take 5 mg by mouth daily.    [provider]  melatonin 3 MG TABS tablet Take 9 mg by mouth at bedtime.    [provider]  metFORMIN (GLUCOPHAGE-XR) 500 MG 24 hr tablet Take 500 mg by mouth daily with breakfast.  07/18/19   [provider]  mirabegron ER (MYRBETRIQ) 50 MG TB24 tablet Take 1 tablet (50 mg total) by mouth daily. 03/17/19   Sondra Come, MD  Multiple Vitamin (MULTIVITAMIN) LIQD Take 5 mLs by mouth daily.    [provider]  pantoprazole (PROTONIX) 40 MG tablet Take 40 mg by mouth daily.  06/28/19   [provider]  polyethylene glycol (MIRALAX / GLYCOLAX) 17 g packet Take 17 g by mouth 2 (two) times daily as needed for mild constipation. 01/18/20  Almon HerculesGonfa, Taye T, MD  senna-docusate (SENOKOT-S) 8.6-50 MG tablet Take 1 tablet by mouth 2 (two) times daily as needed for moderate constipation. 01/18/20   Almon HerculesGonfa, Taye T, MD  sertraline (ZOLOFT) 25 MG tablet Take 25 mg by mouth daily.    [provider]  tamsulosin (FLOMAX) 0.4 MG CAPS capsule Take 0.4 mg by mouth daily.    [provider]    Allergies Losartan potassium and Tamsulosin hcl    Social History Social History   Tobacco Use  . Smoking status: Former Smoker    Packs/day: 1.00    Years: 25.00    Pack years: 25.00    Types: Cigarettes  . Smokeless tobacco: Never Used  Vaping Use  . Vaping Use: Never used  Substance Use Topics  . Alcohol use: No  . Drug use: No    Review of Systems Patient denies headaches, rhinorrhea, blurry vision, numbness, shortness of breath, chest pain, edema, cough, abdominal pain, nausea, vomiting, diarrhea, dysuria, fevers, rashes or hallucinations unless otherwise stated above  in HPI. ____________________________________________   PHYSICAL EXAM:  VITAL SIGNS: Vitals:   02/11/20 0700 02/11/20 0835  BP: 132/69 (!) 147/87  Pulse: 68 60  Resp: 16 20  Temp: 98.4 F (36.9 C)   SpO2: 100% 98%    Constitutional: Alert pleasant and in NAD  Eyes: Conjunctivae are normal.  Head: 1cm lac to right parietal scalp Nose: No congestion/rhinnorhea. Mouth/Throat: Mucous membranes are moist.   Neck: No stridor. Painless ROM.  Cardiovascular: Normal rate, regular rhythm. Grossly normal heart sounds.  Good peripheral circulation. Respiratory: Normal respiratory effort.  No retractions. Lungs CTAB. Gastrointestinal: Soft and nontender. No distention. No abdominal bruits. No CVA tenderness. Genitourinary:  Musculoskeletal: No lower extremity tenderness nor edema.  No joint effusions. Neurologic:  Normal speech and language. No gross focal neurologic deficits are appreciated. No facial droop Skin:  Skin is warm, dry and intact. No rash noted. Psychiatric: Mood and affect are normal. Speech and behavior are normal.  ____________________________________________   LABS (all labs ordered are listed, but only abnormal results are displayed)  No results found for this or any previous visit (from the past 24 hour(s)). ____________________________________________  ____________________________________________  RADIOLOGY  I personally reviewed all radiographic images ordered to evaluate for the above acute complaints and reviewed radiology reports and findings.  These findings were personally discussed with the patient.  Please see medical record for radiology report.  ____________________________________________   PROCEDURES  Procedure(s) performed:  Marland Kitchen.Marland Kitchen.Laceration Repair  Date/Time: 02/11/2020 10:29 AM Performed by: Willy Eddyobinson, Dannon Nguyenthi, MD Authorized by: Willy Eddyobinson, Emmilynn Marut, MD   Consent:    Consent obtained:  Verbal   Consent given by:  Patient Anesthesia (see MAR  for exact dosages):    Anesthesia method:  Topical application   Topical anesthetic:  LET Laceration details:    Location:  Scalp   Scalp location:  R parietal   Length (cm):  1   Depth (mm):  2 Repair type:    Repair type:  Simple Exploration:    Contaminated: no   Treatment:    Area cleansed with:  Hibiclens   Amount of cleaning:  Standard Skin repair:    Repair method:  Staples   Number of staples:  2 Approximation:    Approximation:  Close      Critical Care performed: no ____________________________________________   INITIAL IMPRESSION / ASSESSMENT AND PLAN / ED COURSE  Pertinent labs & imaging results that were available during my care of the patient  were reviewed by me and considered in my medical decision making (see chart for details).   DDX: Contusion, laceration, fracture, ICH,  David Sloan is a 79 y.o. who presents to the ED with presentation as described above.  Patient nontoxic-appearing.  Minor injury with laceration repaired as described above.  CT imaging ordered for the but differential shows no evidence of acute abnormality.  Patient stable and appropriate for outpatient follow-up     The patient was evaluated in Emergency Department today for the symptoms described in the history of present illness. He/she was evaluated in the context of the global COVID-19 pandemic, which necessitated consideration that the patient might be at risk for infection with the SARS-CoV-2 virus that causes COVID-19. Institutional protocols and algorithms that pertain to the evaluation of patients at risk for COVID-19 are in a state of rapid change based on information released by regulatory bodies including the CDC and federal and state organizations. These policies and algorithms were followed during the patient's care in the ED.  As part of my medical decision making, I reviewed the following data within the electronic MEDICAL RECORD NUMBER Nursing notes reviewed and  incorporated, Labs reviewed, notes from prior ED visits and [place] Controlled Substance Database   ____________________________________________   FINAL CLINICAL IMPRESSION(S) / ED DIAGNOSES  Final diagnoses:  Minor head injury, initial encounter  Laceration of scalp, initial encounter      NEW MEDICATIONS STARTED DURING THIS VISIT:  New Prescriptions   No medications on file     Note:  This document was prepared using Dragon voice recognition software and may include unintentional dictation errors.    Willy Eddy, MD 02/11/20 1030

## 2020-02-11 NOTE — ED Notes (Signed)
Pt unable to sign r/t hx of dementia. Spoke with pt's daughter and [facility] about pending d/c. Verbalized understanding at this time.

## 2020-02-11 NOTE — ED Notes (Signed)
Spoke with pt's daughter r/t pending d/c.

## 2020-02-11 NOTE — ED Triage Notes (Addendum)
Pt into ED via [facility] from [facility] due to a head injury; per EMS report from facility pt likes to wander around and was looking in drawers and cabinets for cookies and accidentally bumped his head on such. Pt had no LOC noted, but is on Plavix. Lac approx: 2" long with controlled bleeding. Pt alert and oriented to his baseline but does have a hx of dementia.

## 2020-02-11 NOTE — ED Notes (Signed)
Pt transported to CT at this time.

## 2020-12-14 ENCOUNTER — Other Ambulatory Visit: Payer: Self-pay

## 2020-12-14 ENCOUNTER — Emergency Department: Payer: Medicare Other

## 2020-12-14 ENCOUNTER — Emergency Department
Admission: EM | Admit: 2020-12-14 | Discharge: 2020-12-14 | Disposition: A | Discharge status: Home / Self Care | Payer: Medicare Other | Attending: Emergency Medicine | Admitting: Emergency Medicine

## 2020-12-14 DIAGNOSIS — Z7984 Long term (current) use of oral hypoglycemic drugs: Secondary | ICD-10-CM | POA: Insufficient documentation

## 2020-12-14 DIAGNOSIS — E039 Hypothyroidism, unspecified: Secondary | ICD-10-CM | POA: Diagnosis not present

## 2020-12-14 DIAGNOSIS — I639 Cerebral infarction, unspecified: Secondary | ICD-10-CM | POA: Diagnosis not present

## 2020-12-14 DIAGNOSIS — R4182 Altered mental status, unspecified: Secondary | ICD-10-CM | POA: Diagnosis not present

## 2020-12-14 DIAGNOSIS — E119 Type 2 diabetes mellitus without complications: Secondary | ICD-10-CM | POA: Insufficient documentation

## 2020-12-14 DIAGNOSIS — Z87891 Personal history of nicotine dependence: Secondary | ICD-10-CM | POA: Diagnosis not present

## 2020-12-14 DIAGNOSIS — Z7982 Long term (current) use of aspirin: Secondary | ICD-10-CM | POA: Insufficient documentation

## 2020-12-14 DIAGNOSIS — R410 Disorientation, unspecified: Secondary | ICD-10-CM | POA: Diagnosis not present

## 2020-12-14 DIAGNOSIS — Z96653 Presence of artificial knee joint, bilateral: Secondary | ICD-10-CM | POA: Insufficient documentation

## 2020-12-14 DIAGNOSIS — Z79899 Other long term (current) drug therapy: Secondary | ICD-10-CM | POA: Insufficient documentation

## 2020-12-14 DIAGNOSIS — I10 Essential (primary) hypertension: Secondary | ICD-10-CM | POA: Diagnosis not present

## 2020-12-14 DIAGNOSIS — N390 Urinary tract infection, site not specified: Secondary | ICD-10-CM | POA: Insufficient documentation

## 2020-12-14 DIAGNOSIS — Z794 Long term (current) use of insulin: Secondary | ICD-10-CM | POA: Diagnosis not present

## 2020-12-14 LAB — URINALYSIS, COMPLETE (UACMP) WITH MICROSCOPIC
Bacteria, UA: NONE SEEN
Bilirubin Urine: NEGATIVE
Glucose, UA: NEGATIVE mg/dL
Ketones, ur: NEGATIVE mg/dL
Nitrite: NEGATIVE
Protein, ur: NEGATIVE mg/dL
Specific Gravity, Urine: 1.015 (ref 1.005–1.030)
pH: 6 (ref 5.0–8.0)

## 2020-12-14 LAB — CBC
HCT: 38.3 % — ABNORMAL LOW (ref 39.0–52.0)
Hemoglobin: 12.8 g/dL — ABNORMAL LOW (ref 13.0–17.0)
MCH: 30.4 pg (ref 26.0–34.0)
MCHC: 33.4 g/dL (ref 30.0–36.0)
MCV: 91 fL (ref 80.0–100.0)
Platelets: 253 10*3/uL (ref 150–400)
RBC: 4.21 MIL/uL — ABNORMAL LOW (ref 4.22–5.81)
RDW: 12.8 % (ref 11.5–15.5)
WBC: 7.7 10*3/uL (ref 4.0–10.5)
nRBC: 0 % (ref 0.0–0.2)

## 2020-12-14 LAB — BASIC METABOLIC PANEL
Anion gap: 4 — ABNORMAL LOW (ref 5–15)
BUN: 19 mg/dL (ref 8–23)
CO2: 26 mmol/L (ref 22–32)
Calcium: 9.6 mg/dL (ref 8.9–10.3)
Chloride: 102 mmol/L (ref 98–111)
Creatinine, Ser: 0.62 mg/dL (ref 0.61–1.24)
GFR, Estimated: >60 mL/min (ref >60)
Glucose, Bld: 143 mg/dL — ABNORMAL HIGH (ref 70–99)
Potassium: 4.2 mmol/L (ref 3.5–5.1)
Sodium: 132 mmol/L — ABNORMAL LOW (ref 135–145)

## 2020-12-14 LAB — CBG MONITORING, ED: Glucose-Capillary: 159 mg/dL — ABNORMAL HIGH (ref 70–99)

## 2020-12-14 LAB — TSH: TSH: 4.45 u[IU]/mL (ref 0.350–4.500)

## 2020-12-14 MED ORDER — SODIUM CHLORIDE 0.9 % IV BOLUS
500.0000 mL | Freq: Once | INTRAVENOUS | Status: AC
  Administered 2020-12-14: 500 mL via INTRAVENOUS

## 2020-12-14 NOTE — ED Notes (Signed)
Offered meal to patient, patient declined and states he forgot to put in teeth this morning, patient provided with apple juice.

## 2020-12-14 NOTE — ED Triage Notes (Signed)
Pt dx with UTI on fathers day and began tx. Pt took keflex and cleared up UTI. Pt still having periods of confusion per daughter. Pt baseline of A+Ox3. Pt hx of generalized weakness since stroke.

## 2020-12-14 NOTE — ED Notes (Signed)
ED Provider at bedside. 

## 2020-12-14 NOTE — ED Notes (Signed)
Patient returned from MRI.

## 2020-12-14 NOTE — ED Provider Notes (Signed)
[facility] Emergency Department Provider Note   ____________________________________________   Event Date/Time   First MD Initiated Contact with Patient 12/14/20 [phone]     (approximate)  I have reviewed the triage vital signs and the nursing notes.   HISTORY  Chief Complaint Altered Mental Status  EM caveat: Confusion, present intermittently for about 3 weeks now  HPI David Sloan is a 80 y.o. 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  Also daughter, David Sloan, at bedside reports that he was treated for urinary tract infection with cephalexin for a few days at approximately around Father's Day.  At that point he had confusion, they diagnosed with a UTI, and he was treated for it.  However his confusion never really seem to improve.  He has had confusion with disorientation and not his typical mental clarity now for about 2 to 3 weeks  She reports that they did recheck [facility] and it had cleared up, but his confusion has remained.  He will be disoriented sometimes does not recognize family members, today he does recognize his daughter.  His orientation is off and off.  He has been residing at nursing facility now  No new weakness.  No slurring of speech.  No fevers he has not complained of any pain or discomfort.  Patient also affirms no pain or discomfort reports that he feels fine, but again he does have some confusion   Past Medical History:  Diagnosis Date   Atypical chest pain    a. 04/2013 MV: nl EF, no ischemia.   BPH (benign prostatic hyperplasia)    Chronic back pain    Diabetes mellitus without complication (HCC)    Diabetic neuropathy (HCC)    Essential hypertension    Falls    GERD (gastroesophageal reflux disease)    Memory disorder    OSA (obstructive sleep apnea)    Osteoarthritis    a. s/p bilat knee surgeries.   Sinus bradycardia     Stroke [facility])    a. ~ 2002 - no residual deficits;  b. 03/2015 Head CT: prior old infarcts and foci of small vesel dzs in pons bilat. ? bilat occipital lobe acute infarcts R>L.   Urinary incontinence     Patient Active Problem List   Diagnosis Date Noted   UTI (urinary tract infection) 01/13/2020   Acute metabolic encephalopathy 01/13/2020   HLD (hyperlipidemia) 01/13/2020   Type II diabetes mellitus with renal manifestations (HCC) 01/13/2020   Sepsis (HCC) 01/13/2020   Angioedema 04/22/2018   Long term current use of opiate analgesic 03/04/2018   Nontraumatic intracerebral hemorrhage (HCC) 02/09/2018   Chronic pain syndrome 09/23/2017   Hyperlipidemia, mixed 06/24/2017   BPH (benign prostatic hyperplasia) 06/24/2017   History of CVA (cerebrovascular accident) 06/24/2017   Primary open angle glaucoma (POAG) of both eyes 06/24/2017   Controlled type 2 diabetes mellitus without complication, with long-term current use of insulin (HCC) 06/24/2017   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   Pain of left femur 09/29/2016   Pain medication agreement signed 08/05/2016   Fall 10/03/2015   Gait instability 10/03/2015   TIA (transient ischemic attack) 10/03/2015   Acute CVA (cerebrovascular accident) (HCC) 10/03/2015   CVA (cerebral infarction) 10/03/2015   Risk for falls 07/30/2015   S/P cholecystectomy 07/30/2015   Diabetic neuropathy (HCC) 07/17/2015   Scoliosis 04/13/2015   Fibrositis 04/13/2015   Slurred speech 03/31/2015  Elevated transaminase level 03/31/2015   Gallstones 03/31/2015   Lower extremity weakness 03/30/2015   Cerebral infarction (HCC)    Elevated troponin    Uncontrolled type 2 diabetes mellitus with circulatory disorder (HCC)    EKG abnormalities    Emesis    Gall bladder disease    Other long term (current) drug therapy 02/13/2015   Essential (primary) hypertension 09/18/2014   Right sciatic nerve pain 09/15/2014   Bradycardia 09/13/2014   Cognitive  disorder 04/05/2014   Chronic pain associated with significant psychosocial dysfunction 01/17/2014   Back ache 01/17/2014   Esophageal stenosis 11/30/2013   Can't get food down 11/30/2013   Esophageal stricture 11/30/2013   Chronic, continuous use of opioids 07/06/2013   Cerebrovascular accident (CVA) (HCC) 05/19/2013   Hypothyroid 05/19/2013   GERD (gastroesophageal reflux disease) 05/19/2013   Stroke (HCC) 05/19/2013   Gastroesophageal reflux disease without esophagitis 05/19/2013   Spondylosis of lumbar region without myelopathy or radiculopathy 01/11/2013   SI (sacroiliac) pain 10/06/2012   Osteoarthritis 10/06/2012   Obstructive sleep apnea 10/06/2012   Lumbar degenerative disc disease 10/06/2012   Facet arthritis of lumbar region 10/06/2012   Diabetes mellitus (HCC) 10/06/2012   Diabetes (HCC) 10/06/2012   Punctate keratitis 01/21/2012   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HCC) 01/21/2012   Type 2 diabetes mellitus (HCC) 10/16/2010   Tear film insufficiency 10/16/2010   Senile corneal changes 10/16/2010   Primary open-angle glaucoma 10/16/2010   Moderate stage glaucoma 10/16/2010   Background diabetic retinopathy (HCC) 10/16/2010   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 Authorizing Provider  amLODipine (NORVASC) 5 MG tablet Take 5 mg by mouth daily. 06/23/19   [provider]  aspirin 81 MG chewable tablet Chew 1 tablet (81 mg total) by mouth daily. 10/04/15   Alford Highland, MD  atorvastatin (LIPITOR) 40 MG tablet Take 40 mg by mouth daily.    [provider]  cefdinir (OMNICEF) 300 MG capsule Take 1 capsule (300 mg total) by mouth every 12 (twelve) hours. 01/18/20   Almon Hercules, MD  cholecalciferol (VITAMIN D) 1000 units tablet Take 1,000 Units by  mouth daily.    [provider]  clopidogrel (PLAVIX) 75 MG tablet Take 75 mg by mouth daily.    [provider]  diclofenac Sodium (VOLTAREN) 1 % GEL Apply 4 g topically 2 (two) times daily.    [provider]  divalproex (DEPAKOTE SPRINKLE) 125 MG capsule Take 125 mg by mouth daily.    [provider]  famotidine (PEPCID) 20 MG tablet Take 1 tablet (20 mg total) by mouth 2 (two) times daily for 3 days. 04/24/18 04/27/18  Shaune Pollack, MD  finasteride (PROSCAR) 5 MG tablet Take 5 mg by mouth daily. 06/27/19   [provider]  gabapentin (NEURONTIN) 100 MG capsule Take 100 mg by mouth 2 (two) times daily.  06/30/19   [provider]  insulin NPH Human (HUMULIN N) 100 UNIT/ML injection Inject 0.15 mLs (15 Units total) into the skin 2 (two) times daily before a meal. 01/18/20   Gonfa, Taye T, MD  linagliptin (TRADJENTA) 5 MG TABS tablet Take 5 mg by mouth daily.    [provider]  melatonin 3 MG TABS tablet Take 9 mg by mouth at bedtime.  [provider]  metFORMIN (GLUCOPHAGE-XR) 500 MG 24 hr tablet Take 500 mg by mouth daily with breakfast.  07/18/19   [provider]  mirabegron ER (MYRBETRIQ) 50 MG TB24 tablet Take 1 tablet (50 mg total) by mouth daily. 03/17/19   Sondra Come, MD  Multiple Vitamin (MULTIVITAMIN) LIQD Take 5 mLs by mouth daily.    [provider]  pantoprazole (PROTONIX) 40 MG tablet Take 40 mg by mouth daily.  06/28/19   [provider]  polyethylene glycol (MIRALAX / GLYCOLAX) 17 g packet Take 17 g by mouth 2 (two) times daily as needed for mild constipation. 01/18/20   Almon Hercules, MD  senna-docusate (SENOKOT-S) 8.6-50 MG tablet Take 1 tablet by mouth 2 (two) times daily as needed for moderate constipation. 01/18/20   Almon Hercules, MD  sertraline (ZOLOFT) 25 MG tablet Take 25 mg by mouth daily.    [provider]  tamsulosin (FLOMAX) 0.4 MG CAPS capsule Take 0.4 mg by mouth  daily.    [provider]    Allergies Losartan potassium and Tamsulosin hcl  Family History  Problem Relation Age of Onset   Diabetes Mother    Alzheimer's disease Father     Social History Social History   Tobacco Use   Smoking status: Former    Packs/day: 1.00    Years: 25.00    Pack years: 25.00    Types: Cigarettes   Smokeless tobacco: Never  Vaping Use   Vaping Use: Never used  Substance Use Topics   Alcohol use: No   Drug use: No    Review of Systems Constitutional: No fever/chills Eyes: No visual changes. ENT: No neck pain cardiovascular: Denies chest pain. Respiratory: Denies shortness of breath. Gastrointestinal: No abdominal pain.   Genitourinary: Negative for dysuria. Musculoskeletal: Negative for back pain. Skin: Negative for rash. Neurological: Negative for headaches, areas of focal weakness or numbness.  Does have chronic weakness both lower legs does not ambulate on his own.  Attributes this to prior strokes daughter reports has had strokes on both sides of left him with debility in his legs    ____________________________________________   PHYSICAL EXAM:  VITAL SIGNS: ED Triage Vitals  Enc Vitals Group     BP 12/14/20 0922 119/66     Pulse Rate 12/14/20 0922 69     Resp 12/14/20 0922 17     Temp 12/14/20 0920 97.7 F (36.5 C)     Temp Source 12/14/20 0920 Oral     SpO2 12/14/20 0922 97 %     Weight 12/14/20 0921 181 lb 14.4 oz (82.5 kg)     Height 12/14/20 0921  (1.702 m)     Head Circumference --      [facility] Flow --      Pain Score 12/14/20 0913 0     Pain Loc --      Pain Edu? --      Excl. in GC? --     Constitutional: Alert and oriented to his daughter but reports does not know the year or situation as to why he arrived. Well appearing and in no acute distress.  Appears somewhat chronically ill and generally fatigued especially in his lower extremities Eyes: Conjunctivae are normal. Head: Atraumatic. Nose: No  congestion/rhinnorhea. Mouth/Throat: Mucous membranes are moist. Neck: No stridor.  Cardiovascular: Normal rate, regular rhythm. Grossly normal heart sounds.  Good peripheral circulation. Respiratory: Normal respiratory effort.  No retractions. Lungs CTAB. Gastrointestinal: Soft and  nontender. No distention. Musculoskeletal: No lower extremity tenderness nor edema.  Demonstrates normal strength in bilateral upper extremities.  No pronator drift.  Lower extremities approximately 3 out of 5 strength bilateral, daughter at bedside reports this is chronic Neurologic:  Normal speech and language. No gross focal neurologic deficits are appreciated except for bilateral lower extremity weakness.  First reports he is at a hospital recognizes daughter, then later reports to his daughter that he is at a beach but remains oriented to his name as well as his daughter.  Daughter reports this type of activity has been present for about 2 weeks Skin:  Skin is warm, dry and intact. No rash noted. Psychiatric: Mood and affect are calm, somewhat flat.  ____________________________________________   LABS (all labs ordered are listed, but only abnormal results are displayed)  Labs Reviewed  BASIC METABOLIC PANEL - Abnormal; Notable for the following components:      Result Value   Sodium 132 (*)    Glucose, Bld 143 (*)    Anion gap 4 (*)    All other components within normal limits  CBC - Abnormal; Notable for the following components:   RBC 4.21 (*)    Hemoglobin 12.8 (*)    HCT 38.3 (*)    All other components within normal limits  URINALYSIS, COMPLETE (UACMP) WITH MICROSCOPIC - Abnormal; Notable for the following components:   Color, Urine YELLOW (*)    APPearance CLEAR (*)    Hgb urine dipstick SMALL (*)    Leukocytes,Ua MODERATE (*)    All other components within normal limits  CBG MONITORING, ED - Abnormal; Notable for the following components:   Glucose-Capillary 159 (*)    All other components  within normal limits  URINE CULTURE  TSH   ____________________________________________  EKG  Reviewed inter by me at 950 Heart rate 75 QRS 99 QTc 420 Normal sinus rhythm, no evidence of acute ischemia ____________________________________________  RADIOLOGY  MR BRAIN WO CONTRAST  Result Date: 12/14/2020 CLINICAL DATA:  Increased confusion. EXAM: MRI HEAD WITHOUT CONTRAST TECHNIQUE: Multiplanar, multiecho pulse sequences of the brain and surrounding structures were obtained without intravenous contrast. COMPARISON:  MRI 02/15/2018. FINDINGS: Brain: Remote infarcts in the left occipital lobe and bilateral frontal lobes. Sequela of prior hematoma in the left thalamus. No acute infarct. Advanced patchy and confluent T2/FLAIR hyperintensities within the white matter and pons, compatible with chronic microvascular ischemic disease. Moderate atrophy with ex vacuo ventricular dilation. Small foci of susceptibility artifact in the right pons, left cerebellum and left frontal periventricular white matter., likely related to prior microhemorrhages. Vascular: Major arterial flow voids are maintained at the skull base Skull and upper cervical spine: Normal marrow signal. Sinuses/Orbits: Mild to moderate scattered paranasal sinus mucosal thickening. Other: Mastoid effusions. IMPRESSION: 1. No evidence of acute intracranial abnormality. 2. Remote infarcts in the left occipital lobe, bilateral frontal lobes. 3. Sequela of prior hematoma in the left thalamus with additional prior microhemorrhages. 4. Advanced chronic microvascular ischemic disease and moderate atrophy. 5. Mild-to-moderate paranasal sinus mucosal thickening. Electronically Signed   By: Feliberto HartsFrederick S Jones MD   On: 12/14/2020 11:28     Imaging reviewed, appears no acute finding.  Nothing that I think would explain his intermittent confusion for the last 2 weeks. ____________________________________________   PROCEDURES  Procedure(s) performed:  None  Procedures  Critical Care performed: No  ____________________________________________   INITIAL IMPRESSION / ASSESSMENT AND PLAN / ED COURSE  Pertinent labs & imaging results that were available during my  care of the patient were reviewed by me and considered in my medical decision making (see chart for details).   In discussion with patient's family including his daughter  Patient has been having confusion for approximately 2 weeks time now, thought originally due to urinary tract infection but seems to be ongoing in nature despite reported clearance of UTI.  Reassuring nonfocal exam here, he is oriented to be at the hospital recognizes family, and but is not oriented to date or time.  In speaking with his family it appears that this process has been present for at least a couple 2 to 3 weeks now and they have not noticed any clear new focal symptoms or strokelike symptoms.  He has not had any ongoing evidence of infection, still eating still drinking but seemingly more confused from his baseline a few weeks ago.  Clinical Course as of 12/14/20 1207  Fri Dec 14, 2020  1032 Normal white count no fever no complaint of dysuria.  Given his frequent falls though I did check a urinalysis, I cannot see that there is obvious infection here.  We will send for culture and if culture is positive I would then at that point recommend treatment for possible UTI [MQ]  1032 Patient resting comfortably without distress. [MQ]  1203 Patient's work-up reassuring to this point.  Urinalysis sent for culture.  Discussed with David Sloan his daughter is agreeable with plan to return to facility, understands pending culture [MQ]    Clinical Course User Index [MQ] Sharyn Creamer, MD   No evidence of acute neurologic cardiac pulmonary vascular or infectious etiology identified today.  Patient is stable, normal hemodynamics, discussed with his daughter who reports also being his primary care giver at the bedside,  and comfortable with plan to return to facility to follow-up with the PA there.  Did identify that perhaps they wish to look at his medications, consider adjustments to those, also follow-up on urinary culture in 48 hours.  If urine culture positive would recommend treatment  ____________________________________________   FINAL CLINICAL IMPRESSION(S) / ED DIAGNOSES  Final diagnoses:  Confusion and disorientation        Note:  This document was prepared using Dragon voice recognition software and may include unintentional dictation errors       Sharyn Creamer, MD 12/14/20 1209

## 2020-12-14 NOTE — ED Notes (Signed)
Patient transported to MRI 

## 2020-12-14 NOTE — ED Triage Notes (Signed)
Pt BIBA from [facility] for increased confusion x couple days. Per [facility] staff pt was dx with UTI yesterday and has not started tx yet. Pt denies pain on arrival. Vitals stable. NAD noted. Pt A+Ox2. Unsure of baseline mentation. Hx of dementia.

## 2020-12-14 NOTE — ED Notes (Signed)
[facility]  CALLED  FOR  TRANSPORT  TO  [facility] 

## 2020-12-14 NOTE — Discharge Instructions (Addendum)
Urine culture is pending.  You should receive a call if the culture shows evidence of infection, but you may call the ER at [phone] for results in 48 hours.

## 2020-12-15 LAB — URINE CULTURE: Culture: <10000 — AB

## 2021-04-11 ENCOUNTER — Other Ambulatory Visit: Payer: Self-pay | Admitting: Student

## 2021-04-11 DIAGNOSIS — I4891 Unspecified atrial fibrillation: Secondary | ICD-10-CM

## 2021-04-11 DIAGNOSIS — R0602 Shortness of breath: Secondary | ICD-10-CM

## 2021-04-19 ENCOUNTER — Ambulatory Visit: Payer: Medicare Other

## 2021-12-16 ENCOUNTER — Encounter: Payer: Self-pay | Admitting: Urology

## 2021-12-16 ENCOUNTER — Ambulatory Visit (INDEPENDENT_AMBULATORY_CARE_PROVIDER_SITE_OTHER): Payer: Medicare Other | Admitting: Urology

## 2021-12-16 VITALS — BP 114/62 | HR 61

## 2021-12-16 DIAGNOSIS — N3281 Overactive bladder: Secondary | ICD-10-CM

## 2021-12-16 DIAGNOSIS — N471 Phimosis: Secondary | ICD-10-CM

## 2021-12-16 DIAGNOSIS — R3121 Asymptomatic microscopic hematuria: Secondary | ICD-10-CM

## 2021-12-16 NOTE — Progress Notes (Signed)
   12/16/2021 4:25 PM   David Sloan Feb 10, 1941 [id number]  Reason for visit: Overactive bladder/BPH, reported dipstick hematuria, phimosis  HPI: 81 year old extremely comorbid male with some dementia who resides in a long-term care facility.  He has been followed by myself and our PAs for overactive bladder and BPH in the past, and is on Flomax, finasteride, and Myrbetriq.  He previously underwent PTNS through [facility] and sounds like this was helpful with some of his overactive symptoms and urge incontinence.  He never followed up from that last visit in June 2021.  He is with his daughter today who provides most of the history.  She reports that they did a urinalysis at his facility that showed " trace blood."  Those results were not available to me.  Suspect this is dipstick positive blood and we discussed the differences between dipstick positive blood versus microscopic hematuria, and that we would need to confirm microscopic hematuria prior to considering any further work-up with ultrasound, CT, or cystoscopy.  With his comorbidities I think we need to be reasonable with how aggressive we work-up microscopic hematuria.  She is in agreement.  He has stable overactive bladder symptoms.  He denies any gross hematuria.  On exam he also has moderate phimosis with inability to completely retract the foreskin, but there is no evidence of infection or balanitis on exam.  I recommended observation as he continues to void spontaneously.  Continue medications for OAB and BPH Recommend a urinalysis with microscopic analysis prior to considering any aggressive microscopic hematuria work-up Observation for phimosis reasonable with his lack of symptoms, comorbidities, and age   Sondra Come, MD  [facility] [address], [address] [place], [place] [address] [phone]

## 2021-12-23 ENCOUNTER — Other Ambulatory Visit: Payer: Self-pay | Admitting: Geriatric Medicine

## 2021-12-24 ENCOUNTER — Telehealth: Payer: Self-pay

## 2021-12-24 NOTE — Telephone Encounter (Signed)
Called [facility], spoke with pt's nurse Wynona Canes, advised her of Bactrim order. She states they will have pt start medication today.

## 2021-12-24 NOTE — Telephone Encounter (Signed)
-----   Message from Sondra Come, MD sent at 12/24/2021  8:20 AM EDT ----- Regarding: UTI Urinalysis was suspicious for infection, recommend 10 days Bactrim DS twice daily, this will need to be called to his facility.  Thanks  Legrand Rams, MD 12/24/2021
